# Patient Record
Sex: Male | Born: 1960 | Race: White | Hispanic: No | Marital: Married | Smoking: Never smoker
Health system: Southern US, Community
[De-identification: ages and names within clinical notes are randomized; demographics above are authoritative.]

## PROBLEM LIST (undated history)

## (undated) DIAGNOSIS — G4733 Obstructive sleep apnea (adult) (pediatric): Secondary | ICD-10-CM

## (undated) DIAGNOSIS — E669 Obesity, unspecified: Secondary | ICD-10-CM

## (undated) DIAGNOSIS — I77810 Thoracic aortic ectasia: Secondary | ICD-10-CM

## (undated) DIAGNOSIS — K649 Unspecified hemorrhoids: Secondary | ICD-10-CM

## (undated) DIAGNOSIS — I10 Essential (primary) hypertension: Secondary | ICD-10-CM

## (undated) DIAGNOSIS — J45909 Unspecified asthma, uncomplicated: Secondary | ICD-10-CM

## (undated) DIAGNOSIS — E785 Hyperlipidemia, unspecified: Secondary | ICD-10-CM

## (undated) DIAGNOSIS — N200 Calculus of kidney: Secondary | ICD-10-CM

## (undated) DIAGNOSIS — T7840XA Allergy, unspecified, initial encounter: Secondary | ICD-10-CM

## (undated) DIAGNOSIS — R599 Enlarged lymph nodes, unspecified: Secondary | ICD-10-CM

## (undated) DIAGNOSIS — K5792 Diverticulitis of intestine, part unspecified, without perforation or abscess without bleeding: Secondary | ICD-10-CM

## (undated) DIAGNOSIS — J302 Other seasonal allergic rhinitis: Secondary | ICD-10-CM

## (undated) HISTORY — PX: VASECTOMY: SHX75

## (undated) HISTORY — DX: Enlarged lymph nodes, unspecified: R59.9

## (undated) HISTORY — DX: Diverticulitis of intestine, part unspecified, without perforation or abscess without bleeding: K57.92

## (undated) HISTORY — DX: Thoracic aortic ectasia: I77.810

## (undated) HISTORY — DX: Obstructive sleep apnea (adult) (pediatric): G47.33

## (undated) HISTORY — DX: Calculus of kidney: N20.0

## (undated) HISTORY — DX: Essential (primary) hypertension: I10

## (undated) HISTORY — DX: Other seasonal allergic rhinitis: J30.2

## (undated) HISTORY — DX: Allergy, unspecified, initial encounter: T78.40XA

## (undated) HISTORY — DX: Unspecified asthma, uncomplicated: J45.909

## (undated) HISTORY — DX: Unspecified hemorrhoids: K64.9

## (undated) HISTORY — DX: Hyperlipidemia, unspecified: E78.5

## (undated) HISTORY — DX: Obesity, unspecified: E66.9

---

## 2002-03-10 ENCOUNTER — Encounter: Payer: Self-pay | Admitting: *Deleted

## 2002-03-10 ENCOUNTER — Ambulatory Visit (HOSPITAL_COMMUNITY): Admission: RE | Admit: 2002-03-10 | Discharge: 2002-03-10 | Payer: Self-pay | Admitting: *Deleted

## 2007-06-11 ENCOUNTER — Emergency Department (HOSPITAL_COMMUNITY): Admission: EM | Admit: 2007-06-11 | Discharge: 2007-06-11 | Payer: Self-pay | Admitting: Emergency Medicine

## 2010-01-16 ENCOUNTER — Ambulatory Visit (HOSPITAL_COMMUNITY): Admission: RE | Admit: 2010-01-16 | Discharge: 2010-01-16 | Payer: Self-pay | Admitting: Internal Medicine

## 2010-04-01 ENCOUNTER — Ambulatory Visit (HOSPITAL_COMMUNITY): Admission: RE | Admit: 2010-04-01 | Discharge: 2010-04-01 | Payer: Self-pay | Admitting: Internal Medicine

## 2010-06-10 ENCOUNTER — Ambulatory Visit (HOSPITAL_COMMUNITY): Admission: RE | Admit: 2010-06-10 | Discharge: 2010-06-10 | Payer: Self-pay | Admitting: Internal Medicine

## 2010-06-13 ENCOUNTER — Ambulatory Visit: Payer: Self-pay | Admitting: Oncology

## 2010-06-14 LAB — CBC WITH DIFFERENTIAL/PLATELET
BASO%: 0.3 % (ref 0.0–2.0)
Basophils Absolute: 0 10*3/uL (ref 0.0–0.1)
EOS%: 3.8 % (ref 0.0–7.0)
Eosinophils Absolute: 0.3 10*3/uL (ref 0.0–0.5)
HCT: 46.3 % (ref 38.4–49.9)
HGB: 16.3 g/dL (ref 13.0–17.1)
LYMPH%: 17.6 % (ref 14.0–49.0)
MCH: 30.6 pg (ref 27.2–33.4)
MCHC: 35.2 g/dL (ref 32.0–36.0)
MCV: 87 fL (ref 79.3–98.0)
MONO#: 0.6 10*3/uL (ref 0.1–0.9)
MONO%: 9.1 % (ref 0.0–14.0)
NEUT#: 4.6 10*3/uL (ref 1.5–6.5)
NEUT%: 69.2 % (ref 39.0–75.0)
Platelets: 254 10*3/uL (ref 140–400)
RBC: 5.32 10*6/uL (ref 4.20–5.82)
RDW: 13.6 % (ref 11.0–14.6)
WBC: 6.6 10*3/uL (ref 4.0–10.3)
lymph#: 1.2 10*3/uL (ref 0.9–3.3)

## 2010-06-14 LAB — COMPREHENSIVE METABOLIC PANEL
ALT: 28 U/L (ref 0–53)
AST: 25 U/L (ref 0–37)
Albumin: 4.4 g/dL (ref 3.5–5.2)
Alkaline Phosphatase: 64 U/L (ref 39–117)
BUN: 12 mg/dL (ref 6–23)
CO2: 28 mEq/L (ref 19–32)
Calcium: 9.3 mg/dL (ref 8.4–10.5)
Chloride: 106 mEq/L (ref 96–112)
Creatinine, Ser: 1.02 mg/dL (ref 0.40–1.50)
Glucose, Bld: 112 mg/dL — ABNORMAL HIGH (ref 70–99)
Potassium: 3.6 mEq/L (ref 3.5–5.3)
Sodium: 141 mEq/L (ref 135–145)
Total Bilirubin: 1 mg/dL (ref 0.3–1.2)
Total Protein: 7.5 g/dL (ref 6.0–8.3)

## 2010-06-14 LAB — MORPHOLOGY
PLT EST: ADEQUATE
RBC Comments: NORMAL

## 2010-06-14 LAB — CHCC SMEAR

## 2010-06-14 LAB — LACTATE DEHYDROGENASE: LDH: 152 U/L (ref 94–250)

## 2010-06-18 LAB — BETA 2 MICROGLOBULIN, SERUM: Beta-2 Microglobulin: 1.54 mg/L (ref 1.01–1.73)

## 2010-06-18 LAB — HEPATITIS B SURFACE ANTIGEN: Hepatitis B Surface Ag: NEGATIVE

## 2010-06-18 LAB — HEPATITIS B SURFACE ANTIBODY,QUALITATIVE: Hep B S Ab: POSITIVE — AB

## 2010-06-18 LAB — HEPATITIS C ANTIBODY: HCV Ab: NEGATIVE

## 2010-10-16 ENCOUNTER — Ambulatory Visit: Payer: Self-pay | Admitting: Pulmonary Disease

## 2010-10-16 DIAGNOSIS — G4733 Obstructive sleep apnea (adult) (pediatric): Secondary | ICD-10-CM

## 2010-10-16 HISTORY — DX: Obstructive sleep apnea (adult) (pediatric): G47.33

## 2010-12-12 NOTE — Assessment & Plan Note (Signed)
Summary: consult for possible osa   Visit Type:  Initial Consult Copy to:  David Brooklyn MD Primary David Fuller/Referring David Fuller:  David Brooklyn MD  CC:  sleep consult. PT states his wife says he snores, gasps for air at night, and feels tired during the day. David Fuller Kitchen  History of Present Illness: The pt is a 50y/o male who I have been asked to see for possible osa.  He has been noted to have loud snoring during the night, as well as an abnormal breathing pattern during sleep.  He typically goes to bed at 930pm and arises at 6am to start his day.  He does not feel rested upon arising, but blames on "taking call at night".  He feels "tired" all of the time.  He denies daytime sleepiness, but feels he stays very active.  He will doze very easily watching movies at home, especially if it is not an action film.  He denies any issues with sleepiness while driving.  His epworth score today is normal at 0, and the pt tells me his weight is up about 15 pounds over the last 2 yrs.    Preventive Screening-Counseling & Management  Alcohol-Tobacco     Smoking Status: never  Current Medications (verified): 1)  None  Allergies (verified): 1)  ! Iodine  Past History:  Past Medical History: Allergy problem  Past Surgical History: none  Family History: Reviewed history and no changes required. 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  married  Smoking Status:  never  Review of Systems       The patient complains of productive cough, sore throat, and tooth/dental problems.  The patient denies shortness of breath with activity, shortness of breath at rest, non-productive cough, coughing up blood, chest pain, irregular heartbeats, acid heartburn, indigestion, loss of appetite, weight change, abdominal pain, difficulty swallowing, headaches, nasal congestion/difficulty breathing through nose,  sneezing, itching, ear ache, anxiety, depression, hand/feet swelling, joint stiffness or pain, rash, change in color of mucus, and fever.    Vital Signs:  Patient profile:   50 year old male Height:      71 inches Weight:      257.38 pounds BMI:     36.03 O2 Sat:      96 % on Room air Temp:     98.2 degrees F oral Pulse rate:   96 / minute BP sitting:   136 / 74  (left arm) Cuff size:   large  Vitals Entered By: David Fuller (October 16, 2010 2:07 PM)  O2 Flow:  Room air CC: sleep consult. PT states his wife says he snores, gasps for air at night, feels tired during the day.  Comments meds and allergies updated Phone number updated David Fuller  October 16, 2010 2:07 PM    Physical Exam  General:  ow male in nad Eyes:  PERRLA and EOMI.   Nose:  mild deviation to right with narrowing. Mouth:  elongation of soft palate and uvula no exudates Neck:  no jvd, tmg, LN Lungs:  clear to auscultation no wheezing or rhonchi Heart:  rrr, no mrg Abdomen:  soft and nontender, bs+ Extremities:  no edema noted, pulses intact distally no cyanosis  Neurologic:  alert and oriented, moves all 4. does not appear sleepy.   Impression & Recommendations:  Problem # 1:  OBSTRUCTIVE SLEEP APNEA (ICD-327.23) the pt does have some history that is suggestive of osa,  but it is hard to know if this is just symptomatic snoring.  I have had a long discussion with the pt about sleep apnea, including its impact on QOL and CV health.  I think there is enough suspicion to warrant a sleep study, but given his age and lack of co-morbid medical issues, I think the evaluation can be done with home sleep testing.  The pt is agreeable to this approach.  I have also encouraged the pt to work on weight loss, and reviewed the connection with sleep disordered breathing.    Other Orders: Consultation Level IV (09811) Misc. Referral (Misc. Ref)  Patient Instructions: 1)  will schedule for a home sleep test to  evaluate for possible sleep apnea 2)  work on weight loss. 3)  will call with results of sleep study   Immunization History:  Influenza Immunization History:    Influenza:  historical (08/10/2010)

## 2010-12-18 ENCOUNTER — Ambulatory Visit (INDEPENDENT_AMBULATORY_CARE_PROVIDER_SITE_OTHER): Payer: 59 | Admitting: Pulmonary Disease

## 2010-12-18 ENCOUNTER — Encounter: Payer: Self-pay | Admitting: Pulmonary Disease

## 2010-12-18 DIAGNOSIS — G4733 Obstructive sleep apnea (adult) (pediatric): Secondary | ICD-10-CM

## 2010-12-25 ENCOUNTER — Telehealth (INDEPENDENT_AMBULATORY_CARE_PROVIDER_SITE_OTHER): Payer: Self-pay | Admitting: *Deleted

## 2010-12-26 ENCOUNTER — Telehealth (INDEPENDENT_AMBULATORY_CARE_PROVIDER_SITE_OTHER): Payer: Self-pay | Admitting: *Deleted

## 2011-01-01 NOTE — Progress Notes (Signed)
Summary: returning a call to [facility]  Phone Note Call from Patient   Caller: Patient Call For: [facility] Summary of Call: Patient phoned stated he was returning a call from [place]. He can be reached at [phone] Initial call taken by: Vedia Coffer,  December 25, 2010 4:36 PM  Follow-up for Phone Call        called pt back.  see append to consult note dated 12/18/2010 for complete details. Aundra Millet Reynolds LPN  December 25, 2010 4:44 PM

## 2011-01-01 NOTE — Assessment & Plan Note (Signed)
Summary: home sleep study shows AHI 25/hr.   Copy to:  Marisue Brooklyn MD Primary Provider/Referring Provider:  Marisue Brooklyn MD   History of Present Illness: the pt underwent home sleep testing with a type 3 portable device.  Airflow, respiratory effort, heartrate, and oximetry were all monitored.  Tracings and data summary have been reviewed with the following findings:  1) flow and saturation evaluation periods were 6hrs and one minute 2)  the pt was found to have 15 obstructive apneas and 138 obstructive hypopneas for an AHI of 25/hr. 3)  low oxygen saturation was 79%, with during the night spent less than or equal to 88%.   Allergies: 1)  ! Iodine   Impression & Recommendations:  Problem # 1:  OBSTRUCTIVE SLEEP APNEA (ICD-327.23)  the pt's home sleep testing reveals moderate osa with AHI 25/hr and desaturation to 79%.  Treatment options include a trial of weight loss, upper airway surgery, dental appliance, and cpap.  Clinical correlation suggested.  Other Orders: Sleep Std Airflow/Heartrate and O2 SAT unattended (16109)  Appended Document: home sleep study shows AHI 25/hr. Aundra Millet, pt needs ov to review sleep study  Appended Document: home sleep study shows AHI 25/hr. [id number]  Appended Document: home sleep study shows AHI 25/hr. [id number]  Appended Document: home sleep study shows AHI 25/hr. pt called back.  offered to schedule pt for an ov with kc to discuss sleep study results.  pt stated " i'm not interested right now in the results and do not want to make an appt at this time.  If i change my mind I'll call back to schedule ov. "  Will forward message to [facility] as an FYI.    Appended Document: home sleep study shows AHI 25/hr. Aundra Millet, please call Dr. Hardie Pulley nurse and let her know pt has moderate sleep apnea by his sleep study, but the pt did not wish to come back to discuss treatment.  "just wanted to let them know".  thanks.   Appended Document: home sleep  study shows AHI 25/hr. spoke with dr. Rock Nephew nurse and advised.

## 2011-01-07 NOTE — Progress Notes (Signed)
Summary: returning call  > 3.12.12 appt w/ [facility]  Phone Note Call from Patient Call back at [phone]   Caller: Patient Call For: clance Summary of Call: Returning Megan's call. Initial call taken by: Darletta Moll,  December 26, 2010 3:39 PM  Follow-up for Phone Call        I did not call this pt.  See previous phone note.[name] KitchenMarland KitchenMarland KitchenI had spoke with him about getting him in for an appt to discuss sleep study results and pt did not wish to come in at this time to discuss results.  So I'm not sure why he is calling back.  [id number].  [name] KitchenArman Filter LPN  January 01, 2011 9:24 AM   Scheduled for 3/12 with KC.Juanita [facility]  January 01, 2011 11:03 AM  b/c of pending appts, will sign off on message. Boone Master CNA/MA  January 01, 2011 11:14 AM

## 2011-01-20 ENCOUNTER — Encounter: Payer: Self-pay | Admitting: Pulmonary Disease

## 2011-01-20 ENCOUNTER — Ambulatory Visit (INDEPENDENT_AMBULATORY_CARE_PROVIDER_SITE_OTHER): Payer: 59 | Admitting: Pulmonary Disease

## 2011-01-20 DIAGNOSIS — G4733 Obstructive sleep apnea (adult) (pediatric): Secondary | ICD-10-CM

## 2011-01-28 NOTE — Assessment & Plan Note (Signed)
Summary: rov to discuss sleep study results.    Copy to:  Marisue Brooklyn MD Primary Provider/Referring Provider:  Marisue Brooklyn MD  CC:  Ov to discuss apnea link results.  Pt states he recently started on Zyrtec d/t PND. [name] Kitchen  History of Present Illness: the pt comes in today for f/u of his recent sleep study.  He was found to have moderate osa, with an AHI of 25/hr and desat transiently to 79%.  I have reviewed the study with him in detail, and answered all of his questions.    Current Medications (verified): 1)  Zyrtec Allergy 10 Mg Tabs (Cetirizine Hcl) .... Take 1 Tablet By Mouth Once A Day  Allergies (verified): 1)  ! Iodine  Review of Systems       The patient complains of productive cough, non-productive cough, weight change, and joint stiffness or pain.  The patient denies shortness of breath with activity, shortness of breath at rest, coughing up blood, chest pain, irregular heartbeats, acid heartburn, indigestion, loss of appetite, abdominal pain, difficulty swallowing, sore throat, tooth/dental problems, headaches, nasal congestion/difficulty breathing through nose, sneezing, itching, ear ache, anxiety, depression, hand/feet swelling, rash, change in color of mucus, and fever.    Vital Signs:  Patient profile:   50 year old male Height:      71 inches Weight:      267 pounds BMI:     37.37 O2 Sat:      98 % on Room air Temp:     98.0 degrees F oral Pulse rate:   92 / minute BP sitting:   114 / 72  Vitals Entered By: Arman Filter LPN (January 20, 2011 1:29 PM)  O2 Flow:  Room air CC: Ov to discuss apnea link results.  Pt states he recently started on Zyrtec d/t PND.  Comments Medications reviewed with patient Arman Filter LPN  January 20, 2011 1:29 PM    Physical Exam  General:  obese male in nad  Extremities:  no edema or cyanosis  Neurologic:  alert, does not appear sleepy, moves all 4    Impression & Recommendations:  Problem # 1:  OBSTRUCTIVE SLEEP APNEA  (ICD-327.23)  the pt has moderate osa by his recent sleep study.  I have discussed the various treatment options with him, including a trial of weight loss alone, upper airway surgery, dental appliance, and cpap.  At least this degree of sleep apnea does not represent a large CV risk for him.  At this point, he would like to take the next 6mos and work on weight loss.  If he is not successful, he will consider one of the more aggressive treatment options.  Medications Added to Medication List This Visit: 1)  Zyrtec Allergy 10 Mg Tabs (Cetirizine hcl) .... Take 1 tablet by mouth once a day  Other Orders: Est. Patient Level III (16109)  Patient Instructions: 1)  work on weight loss 2)  please call if not successful over the next 6mos, or if you decide to treat your sleep apnea more aggressively.

## 2011-04-15 ENCOUNTER — Encounter (INDEPENDENT_AMBULATORY_CARE_PROVIDER_SITE_OTHER): Payer: Self-pay | Admitting: General Surgery

## 2011-05-27 ENCOUNTER — Encounter (INDEPENDENT_AMBULATORY_CARE_PROVIDER_SITE_OTHER): Payer: Self-pay | Admitting: General Surgery

## 2011-05-27 ENCOUNTER — Ambulatory Visit (INDEPENDENT_AMBULATORY_CARE_PROVIDER_SITE_OTHER): Payer: 59 | Admitting: General Surgery

## 2011-05-27 VITALS — BP 144/106 | HR 80 | Temp 97.0°F

## 2011-05-27 DIAGNOSIS — R591 Generalized enlarged lymph nodes: Secondary | ICD-10-CM

## 2011-05-27 DIAGNOSIS — R599 Enlarged lymph nodes, unspecified: Secondary | ICD-10-CM

## 2011-05-27 NOTE — Progress Notes (Signed)
HPI The patient comes in today for recheck for lymphadenopathy possibly seen on the CT scan before. On physical exam today he has no evidence of recurrent problems  PE In his cervical region he has no evidence of cervical adenopathy  In his suprasternal scalene area he has no adenopathy in his axilla he has no adenopathy he has no epicondylar adenopathy.  He has no inguinal adenopathy  Studiy review There are no current studies to review however I plan on ordering a CT scan of his abdomen and pelvis with contrast.  Assessment No clinical evidence of enlarged lymph nodes throughout his body  Plan In repeat CT scan of his abdomen and pelvis prior to discharge the patient from clinic.

## 2011-06-02 ENCOUNTER — Other Ambulatory Visit (HOSPITAL_COMMUNITY): Payer: 59

## 2011-06-06 ENCOUNTER — Ambulatory Visit (HOSPITAL_COMMUNITY)
Admission: RE | Admit: 2011-06-06 | Discharge: 2011-06-06 | Disposition: A | Discharge status: Home / Self Care | Payer: 59 | Source: Ambulatory Visit | Attending: General Surgery | Admitting: General Surgery

## 2011-06-06 ENCOUNTER — Other Ambulatory Visit (INDEPENDENT_AMBULATORY_CARE_PROVIDER_SITE_OTHER): Payer: Self-pay | Admitting: General Surgery

## 2011-06-06 ENCOUNTER — Other Ambulatory Visit (HOSPITAL_COMMUNITY): Payer: Self-pay | Admitting: General Surgery

## 2011-06-06 DIAGNOSIS — R05 Cough: Secondary | ICD-10-CM

## 2011-06-06 DIAGNOSIS — N200 Calculus of kidney: Secondary | ICD-10-CM | POA: Insufficient documentation

## 2011-06-06 DIAGNOSIS — Z91013 Allergy to seafood: Secondary | ICD-10-CM | POA: Insufficient documentation

## 2011-06-06 DIAGNOSIS — R591 Generalized enlarged lymph nodes: Secondary | ICD-10-CM

## 2011-06-06 DIAGNOSIS — K5730 Diverticulosis of large intestine without perforation or abscess without bleeding: Secondary | ICD-10-CM | POA: Insufficient documentation

## 2011-06-06 DIAGNOSIS — R053 Chronic cough: Secondary | ICD-10-CM

## 2011-06-06 DIAGNOSIS — R599 Enlarged lymph nodes, unspecified: Secondary | ICD-10-CM | POA: Insufficient documentation

## 2011-06-12 ENCOUNTER — Telehealth (INDEPENDENT_AMBULATORY_CARE_PROVIDER_SITE_OTHER): Payer: Self-pay | Admitting: General Surgery

## 2011-06-12 NOTE — Telephone Encounter (Signed)
pls call patient regarding CT results.[name] Kitchenjw8/2

## 2011-06-12 NOTE — Telephone Encounter (Signed)
pls call patient reguarding CT test results...jw8/2

## 2011-06-12 NOTE — Telephone Encounter (Signed)
David Fuller, David Fuller will be working tomorrow and would like for you to call his cell phone @ [phone] with his CT results.

## 2011-06-12 NOTE — Telephone Encounter (Signed)
pls call pt reguarding test results

## 2011-06-25 ENCOUNTER — Other Ambulatory Visit (INDEPENDENT_AMBULATORY_CARE_PROVIDER_SITE_OTHER): Payer: Self-pay

## 2011-06-25 ENCOUNTER — Other Ambulatory Visit (INDEPENDENT_AMBULATORY_CARE_PROVIDER_SITE_OTHER): Payer: Self-pay | Admitting: General Surgery

## 2011-06-25 DIAGNOSIS — C8590 Non-Hodgkin lymphoma, unspecified, unspecified site: Secondary | ICD-10-CM

## 2011-06-25 DIAGNOSIS — R591 Generalized enlarged lymph nodes: Secondary | ICD-10-CM

## 2011-07-02 ENCOUNTER — Encounter (INDEPENDENT_AMBULATORY_CARE_PROVIDER_SITE_OTHER): Payer: Self-pay | Admitting: General Surgery

## 2011-07-03 ENCOUNTER — Other Ambulatory Visit (HOSPITAL_COMMUNITY): Payer: 59

## 2011-07-03 ENCOUNTER — Inpatient Hospital Stay (HOSPITAL_COMMUNITY): Admission: RE | Admit: 2011-07-03 | Payer: 59 | Source: Ambulatory Visit

## 2011-07-04 ENCOUNTER — Encounter (HOSPITAL_COMMUNITY)
Admission: RE | Admit: 2011-07-04 | Discharge: 2011-07-04 | Disposition: A | Discharge status: Home / Self Care | Payer: 59 | Source: Ambulatory Visit | Attending: General Surgery | Admitting: General Surgery

## 2011-07-04 DIAGNOSIS — R591 Generalized enlarged lymph nodes: Secondary | ICD-10-CM

## 2011-07-04 DIAGNOSIS — R599 Enlarged lymph nodes, unspecified: Secondary | ICD-10-CM | POA: Insufficient documentation

## 2011-07-04 DIAGNOSIS — N200 Calculus of kidney: Secondary | ICD-10-CM | POA: Insufficient documentation

## 2011-07-04 MED ORDER — FLUDEOXYGLUCOSE F - 18 (FDG) INJECTION
16.3000 | Freq: Once | INTRAVENOUS | Status: AC | PRN
  Administered 2011-07-04: 16.3 via INTRAVENOUS

## 2011-07-07 LAB — GLUCOSE, CAPILLARY: Glucose-Capillary: 100 mg/dL — ABNORMAL HIGH (ref 70–99)

## 2011-07-10 ENCOUNTER — Telehealth (INDEPENDENT_AMBULATORY_CARE_PROVIDER_SITE_OTHER): Payer: Self-pay | Admitting: General Surgery

## 2011-07-10 NOTE — Telephone Encounter (Signed)
Talked to patient to explain PET scan and CT results.  At this point there is no urgent need to perform a biopsy.  There are some areas of increased activity, and they recommended either repeat study in some period of time (I have chosen 6 months) or biopsy.  Biopsy could be laparoscopic or open, but one of the LNs was in the obdurator area on the right side.  The official recommendation is to wait 6 months at which time we will need a repeat PET scan or the CT scan this is per the recommendation of the radiologist. If there are any clinical changes in that period of time then we will go ahead with an open or laparoscopic biopsy

## 2012-06-01 ENCOUNTER — Ambulatory Visit (INDEPENDENT_AMBULATORY_CARE_PROVIDER_SITE_OTHER): Payer: 59 | Admitting: General Surgery

## 2012-06-01 ENCOUNTER — Encounter (INDEPENDENT_AMBULATORY_CARE_PROVIDER_SITE_OTHER): Payer: Self-pay | Admitting: General Surgery

## 2012-06-01 VITALS — BP 132/96 | HR 76 | Temp 97.8°F | Resp 16 | Ht 73.0 in | Wt 266.4 lb

## 2012-06-01 DIAGNOSIS — R599 Enlarged lymph nodes, unspecified: Secondary | ICD-10-CM

## 2012-06-01 DIAGNOSIS — R590 Localized enlarged lymph nodes: Secondary | ICD-10-CM

## 2012-06-01 NOTE — Progress Notes (Signed)
HPI The patient was verified any constitutional symptoms. He has no fevers, chills, or weight loss. He has no unusual pain in this joints.  PE On examination I examined the cervical lymph node chains were there was no evidence of adenopathy. He had no supraclavicular evidence of adenopathy. There was no axillary evidence of adenopathy. There is no inguinal evidence of adenopathy. There was no epicochlear clear evidence of adenopathy  Studiy review There are no new studies to review.  Assessment No evidence of pathologic lymphadenopathy.  Plan The patient is to return to see me if he should develop any lymphadenopathy, constitutional symptoms including weight loss, arthralgias, as night sweats, and unusual body pain.

## 2012-06-04 ENCOUNTER — Ambulatory Visit (INDEPENDENT_AMBULATORY_CARE_PROVIDER_SITE_OTHER): Payer: 59 | Admitting: Physician Assistant

## 2012-06-04 VITALS — BP 138/82 | HR 92 | Temp 97.8°F | Resp 18 | Ht 74.5 in | Wt 266.0 lb

## 2012-06-04 DIAGNOSIS — L309 Dermatitis, unspecified: Secondary | ICD-10-CM

## 2012-06-04 DIAGNOSIS — T63481A Toxic effect of venom of other arthropod, accidental (unintentional), initial encounter: Secondary | ICD-10-CM

## 2012-06-04 DIAGNOSIS — L259 Unspecified contact dermatitis, unspecified cause: Secondary | ICD-10-CM

## 2012-06-04 DIAGNOSIS — T6391XA Toxic effect of contact with unspecified venomous animal, accidental (unintentional), initial encounter: Secondary | ICD-10-CM

## 2012-06-04 NOTE — Patient Instructions (Signed)
Use an OTC antihistamine to alleviate any additional swelling or itching.

## 2012-06-04 NOTE — Progress Notes (Signed)
  Subjective:    Patient ID: David Fuller, male    DOB: 22-May-1961, 51 y.o.   MRN: [id number]  HPI This 51 y.o. Male presents for evaluation of a bite on the right flank this afternoon. Mowing mother's yard when felt "Something like a 25 gauge needle sticking me in the side."  Swatted at it, but didn't find anything in his clothing.  Continued his activity and then several hours later noticed the area was irritated.  It was red and swollen.  His wife urged him to come in.  He notes it is improved somewhat now.  Review of Systems As above.  Past Medical History  Diagnosis Date  . Diverticulitis   . Hemorrhoid   . Kidney stones   . Swollen lymph nodes     abd, groin,   . Allergy     Past Surgical History  Procedure Date  . Vasectomy MID 90S    Prior to Admission medications   Medication Sig Start Date End Date Taking? Authorizing Provider  fluticasone (FLONASE) 50 MCG/ACT nasal spray as needed.  03/18/11  Yes Historical Provider, MD  SINGULAIR 10 MG tablet  04/16/11  Yes Historical Provider, MD  VENTOLIN HFA 108 (90 BASE) MCG/ACT inhaler as needed.  04/16/11  Yes Historical Provider, MD    Allergies  Allergen Reactions  . Shellfish Allergy Nausea Only  . Iodine     History   Social History  . Marital Status: Married    Spouse Name: N/A    Number of Children: 1 daughter, age 29, Paramedic in [place], [place]  . Years of Education: N/A   Occupational History  . 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 Smoking status: Never Smoker   . Smokeless tobacco: Never Used  . Alcohol Use: 0.0 oz/week    1-2 Cans of beer per week     2OZ A WEEK  . Drug Use: No  . Sexually Active: Not on file   Other Topics Concern  . Not on file   Social History Narrative  . No narrative on file    Family History  Problem Relation Age of Onset  . Diabetes Father   . Cancer Brother     Melanoma- left arm       Objective:   Physical  Exam  Blood pressure 138/82, pulse 92, temperature 97.8 F (36.6 C), temperature source Oral, resp. rate 18, height 6' 2.5" (1.892 m), weight 266 lb (120.657 kg), SpO2 98.00%. Body mass index is 33.70 kg/(m^2). Well-developed, well nourished WM who is awake, alert and oriented, in NAD. HEENT: [place]/AT, sclera and conjunctiva are clear.   Lungs: normal effort Skin: warm and dry with area of mild erythema of the right flank.  No induration, drainage, urticaria.  o wound.       Assessment & Plan:   1. Dermatitis   2. Insect sting    Patient Instructions  Use an OTC antihistamine to alleviate any additional swelling or itching.   RTC prn.

## 2013-05-04 IMAGING — CT CT ABD-PELV W/O CM
1 of 2 series · 15 of 32 positions shown, 19 images · non-contrast
Comparison: [DATE] and 04/01/2010

CLINICAL DATA: Reevaluate retroperitoneal lymphadenopathy. No IV
contrast was used as the patient is severely allergic to
shellfish.

CT ABDOMEN AND PELVIS WITHOUT CONTRAST
TECHNIQUE: Multidetector CT imaging of the abdomen and pelvis was
performed following the standard protocol without intravenous
contrast.

[Series 2: routine abdomen/pelvis with · axial · 0.94mm/px · z∈[-564,-74]mm · 15 of 108 slices shown, 19 images]
[im 5/108  soft-tissue]
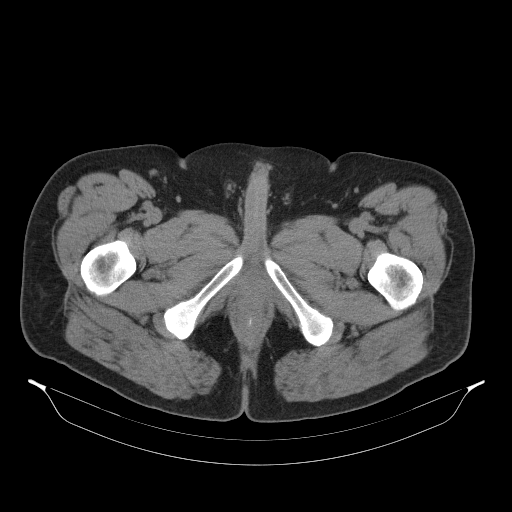
[im 5/108  bone]
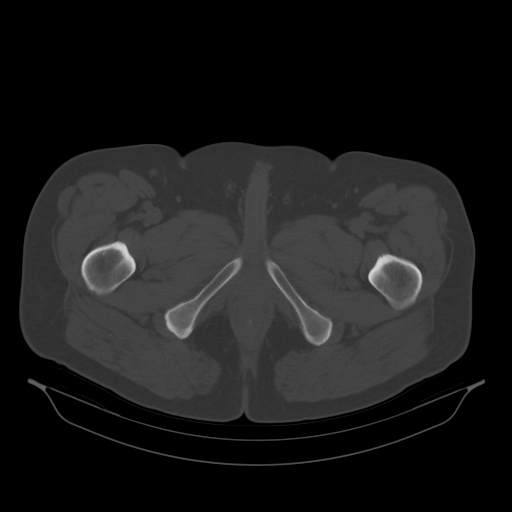
[im 14/108  soft-tissue]
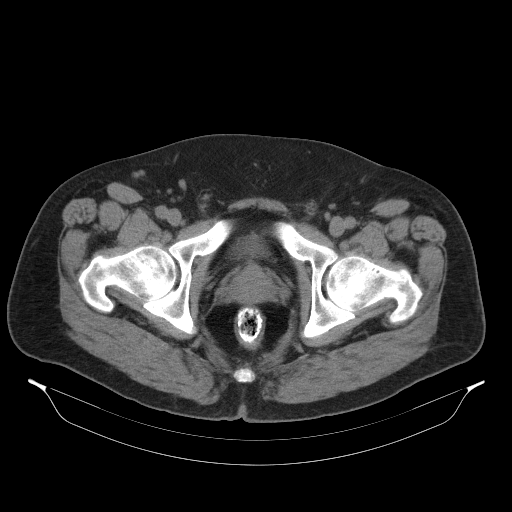
[im 23/108  soft-tissue]
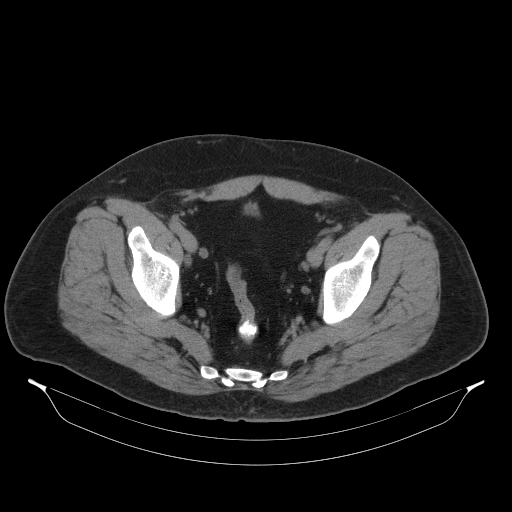
[im 32/108  soft-tissue]
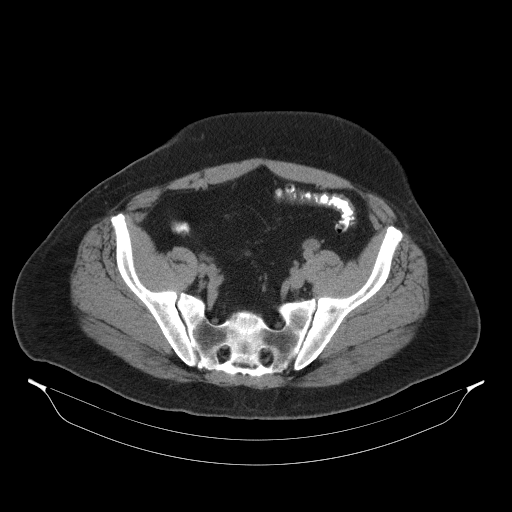
[im 36/108  soft-tissue]
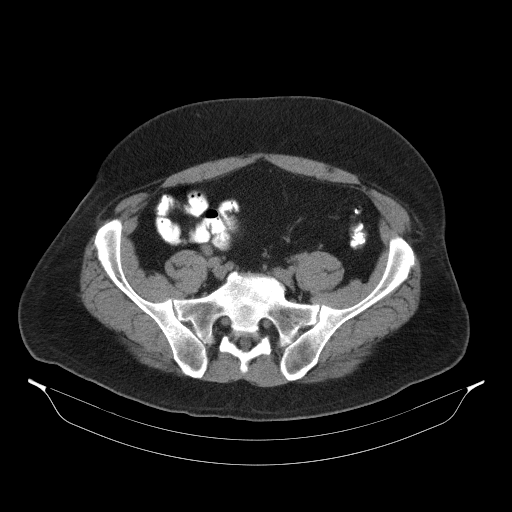
[im 45/108  soft-tissue]
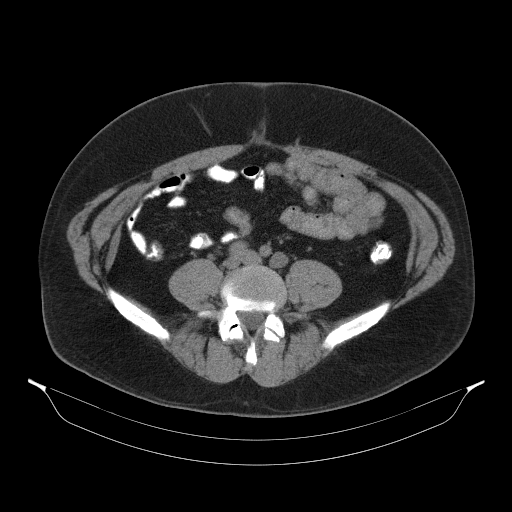
[im 54/108  soft-tissue]
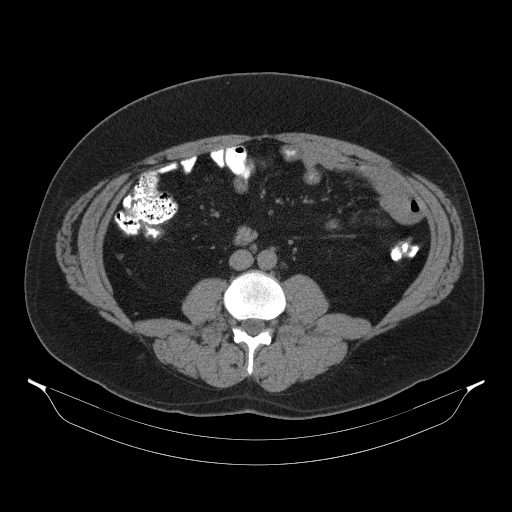
[im 63/108  soft-tissue]
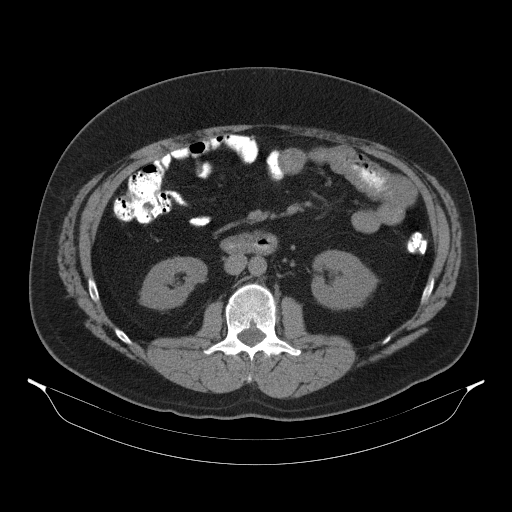
[im 72/108  soft-tissue]
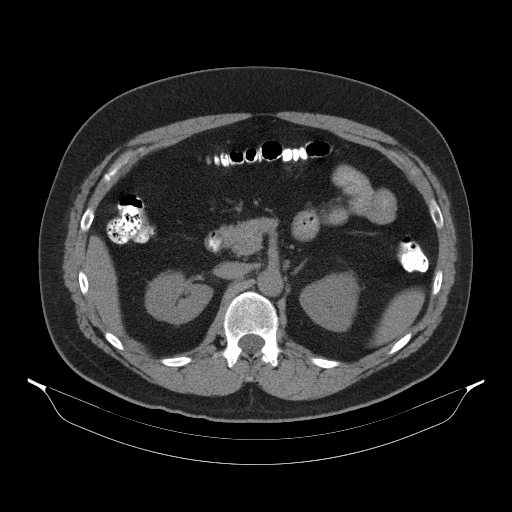
[im 72/108  bone]
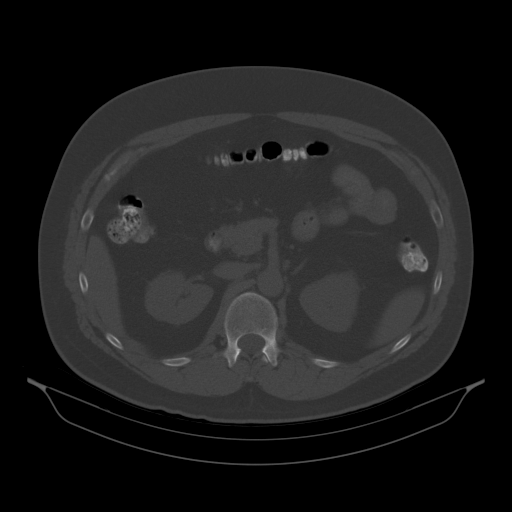
[im 76/108  soft-tissue]
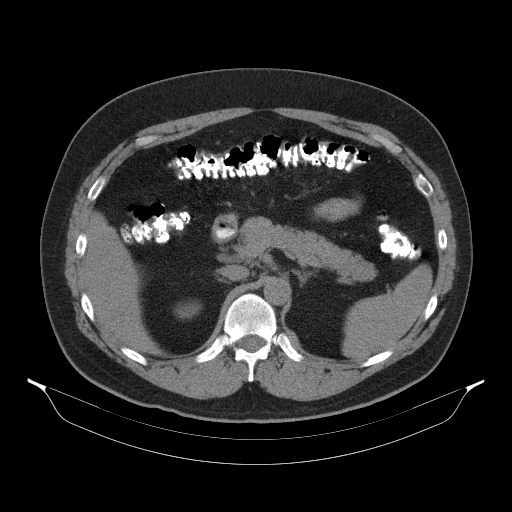
[im 85/108  soft-tissue]
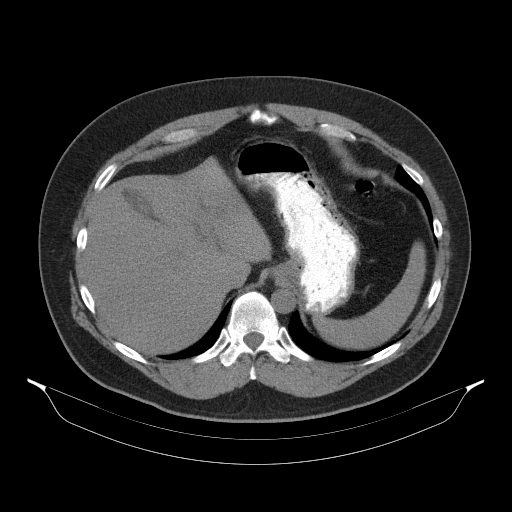
[im 90/108  lung]
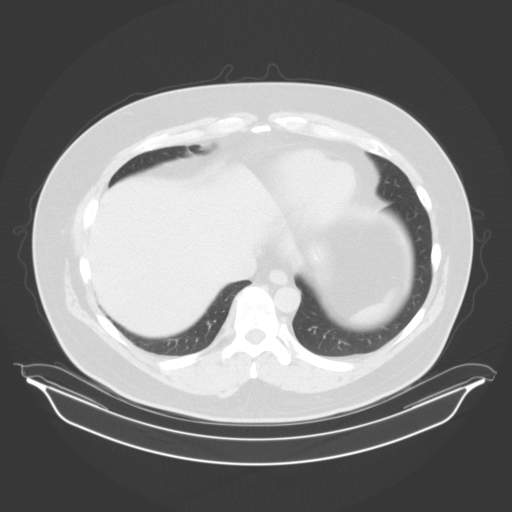
[im 94/108  soft-tissue]
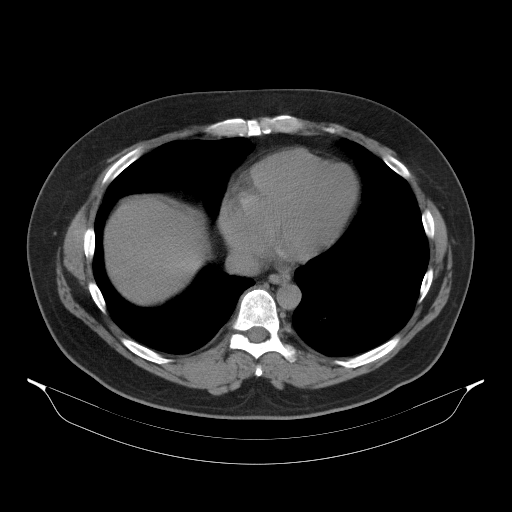
[im 94/108  lung]
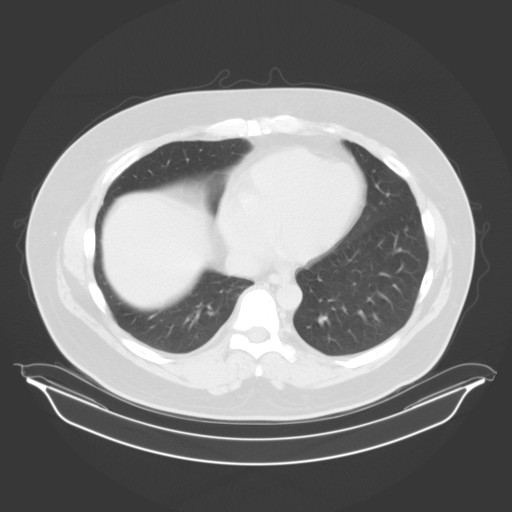
[im 99/108  lung]
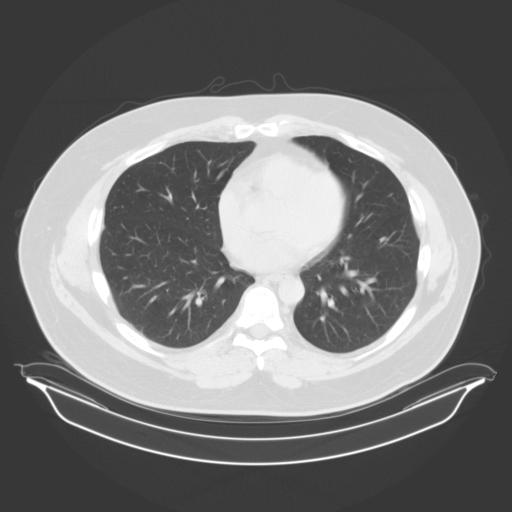
[im 103/108  soft-tissue]
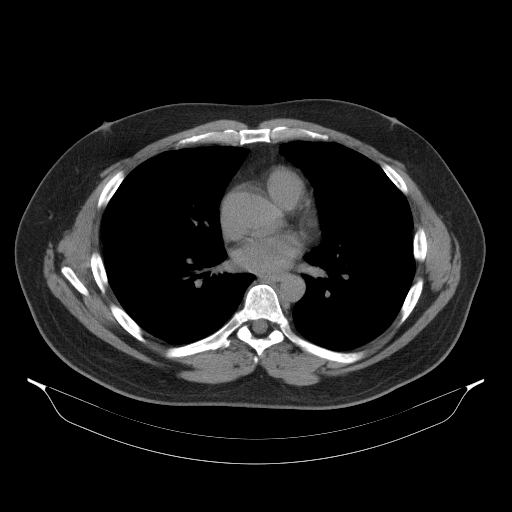
[im 103/108  lung]
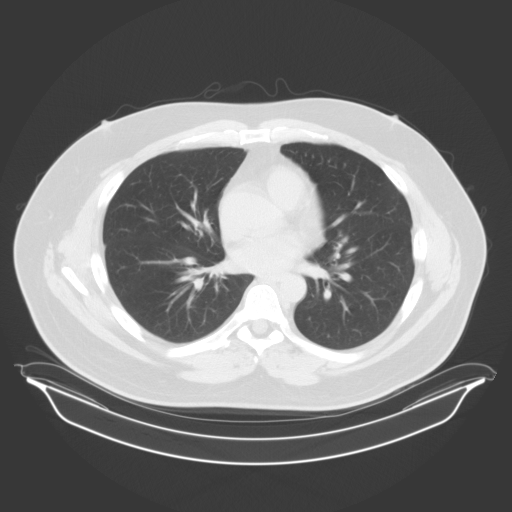

[15 of 32 positions shown; findings below may reference images not displayed]

FINDINGS: The lung bases remain clear.

Again noted in the the presence of pelvic and retroperitoneal
adenopathy the largest lymph node in the left external iliac chain
is slightly larger today measuring 3.9 x 1.5 cm (image 90) (3.7 x
1.4 cm previously).  Multiple other lymph nodes along both external
and common iliac chains are seen.  These all appear stable in size.
No new adenopathy is identified.

The liver, spleen, gallbladder, adrenal glands, pancreas and
kidneys appear stable. Two renal calculi are again identified in
the left kidney one in the mid and one in the lower pole of these
are stable in size and position.

Descending colonic and sigmoid diverticulosis is identified.  No
ancillary findings are identified to suggest concurrent
diverticulitis.  Complete resolution of the minimal pericolonic
stranding seen on the prior exam has taken place.

The bladder and prostate gland appears normal.  No intra-abdominal
or pelvic fluid or inflammatory change are seen.  Extraabdominal
soft tissue planes appear maintained and no worrisome bone lesions
are identified.
IMPRESSION: Slight increase in size of the largest lymph node in the external
iliac chain on the left.  Otherwise stable pelvic and
retroperitoneal adenopathy is seen.  The relative stability is
reassuring and this is likely reactive but the possibility of a
slowly progressive lymphoproliferative disorder persists.

Stable diverticulosis and nonobstructive left renal calculi.

## 2013-08-24 ENCOUNTER — Encounter: Payer: Self-pay | Admitting: Physician Assistant

## 2013-08-24 DIAGNOSIS — J45909 Unspecified asthma, uncomplicated: Secondary | ICD-10-CM | POA: Insufficient documentation

## 2013-08-24 DIAGNOSIS — E785 Hyperlipidemia, unspecified: Secondary | ICD-10-CM | POA: Insufficient documentation

## 2013-08-24 DIAGNOSIS — J302 Other seasonal allergic rhinitis: Secondary | ICD-10-CM | POA: Insufficient documentation

## 2013-08-24 HISTORY — DX: Other seasonal allergic rhinitis: J30.2

## 2013-08-26 ENCOUNTER — Encounter: Payer: Self-pay | Admitting: Internal Medicine

## 2013-09-13 ENCOUNTER — Ambulatory Visit: Payer: 59 | Admitting: Physician Assistant

## 2013-09-13 ENCOUNTER — Encounter: Payer: Self-pay | Admitting: Physician Assistant

## 2013-09-13 ENCOUNTER — Other Ambulatory Visit: Payer: Self-pay | Admitting: Physician Assistant

## 2013-09-13 VITALS — BP 132/78 | HR 88 | Temp 97.9°F | Resp 16 | Ht 73.0 in | Wt 269.0 lb

## 2013-09-13 DIAGNOSIS — E785 Hyperlipidemia, unspecified: Secondary | ICD-10-CM

## 2013-09-13 DIAGNOSIS — J45909 Unspecified asthma, uncomplicated: Secondary | ICD-10-CM

## 2013-09-13 DIAGNOSIS — I10 Essential (primary) hypertension: Secondary | ICD-10-CM

## 2013-09-13 DIAGNOSIS — J302 Other seasonal allergic rhinitis: Secondary | ICD-10-CM

## 2013-09-13 DIAGNOSIS — Z0000 Encounter for general adult medical examination without abnormal findings: Secondary | ICD-10-CM

## 2013-09-13 DIAGNOSIS — G4733 Obstructive sleep apnea (adult) (pediatric): Secondary | ICD-10-CM

## 2013-09-13 LAB — LIPID PANEL
Cholesterol: 217 mg/dL — ABNORMAL HIGH (ref 0–200)
HDL: 46 mg/dL (ref >39)
Total CHOL/HDL Ratio: 4.7 Ratio
Triglycerides: 70 mg/dL (ref <150)

## 2013-09-13 LAB — MAGNESIUM: Magnesium: 1.8 mg/dL (ref 1.5–2.5)

## 2013-09-13 LAB — HEPATIC FUNCTION PANEL
AST: 29 U/L (ref 0–37)
Albumin: 4.5 g/dL (ref 3.5–5.2)
Total Bilirubin: 1.2 mg/dL (ref 0.3–1.2)

## 2013-09-13 LAB — HEMOGLOBIN A1C: Mean Plasma Glucose: 111 mg/dL (ref <117)

## 2013-09-13 MED ORDER — ALBUTEROL SULFATE HFA 108 (90 BASE) MCG/ACT IN AERS: 2.0000 | INHALATION_SPRAY | RESPIRATORY_TRACT | Status: DC | PRN

## 2013-09-13 MED ORDER — MONTELUKAST SODIUM 10 MG PO TABS: 10.0000 mg | ORAL_TABLET | Freq: Every day | ORAL | Status: DC

## 2013-09-13 MED ORDER — FLUTICASONE PROPIONATE 50 MCG/ACT NA SUSP: 2.0000 | NASAL | Status: DC | PRN

## 2013-09-13 NOTE — Progress Notes (Signed)
Patient ID: David Fuller, male   DOB: 10-11-1961, 52 y.o.   MRN: [id number]  Complete Physical HPI Patient presents for complete physical.  Patient's blood pressure has been controlled at home. Patient denies chest pain, shortness of breath, dizziness. Does 20 mins on treadclimber daily without any problems. Patient's cholesterol is diet controlled and denies myalgias. His cholesterol last visit was  155, last visit patient was prescribed lipitor but patient does not want medications . Allergies controlled on meds, needs refills.  .  Current outpatient prescriptions:fexofenadine (ALLEGRA) 180 MG tablet, Take 180 mg by mouth daily., Disp: , Rfl: ;  fluticasone (FLONASE) 50 MCG/ACT nasal spray, as needed. , Disp: , Rfl: ;  SINGULAIR 10 MG tablet, , Disp: , Rfl: ;  VENTOLIN HFA 108 (90 BASE) MCG/ACT inhaler, as needed. , Disp: , Rfl: .   Health Maintenance  Topic Date Due  . Tetanus/tdap  12/06/1979  . Colonoscopy  12/05/2010  . Influenza Vaccine  06/10/2013    Allergies  Allergen Reactions  . Shellfish Allergy Nausea Only  . Iodine    Past Medical History  Diagnosis Date  . Diverticulitis   . Hemorrhoid   . Kidney stones   . Swollen lymph nodes     abd, groin,   . Allergy   . Asthma   . Hyperlipidemia   . Hypertension    Past Surgical History  Procedure Laterality Date  . Vasectomy  MID 90S   Family History  Problem Relation Age of Onset  . Diabetes Father   . Heart disease Father   . Heart failure Father   . Cancer Brother     Melanoma- left arm  . Atrial fibrillation Mother     History  Substance Use Topics  . Smoking status: Never Smoker   . Smokeless tobacco: Never Used  . Alcohol Use: 0.0 oz/week    1-2 Cans of beer per week     Comment: 2OZ A WEEK   ROS Constitutional: Denies fever, chills, weight loss/gain, headaches, insomnia, fatigue, night sweats, and change in appetite. Eyes: Denies redness, blurred vision, diplopia, discharge, itchy, watery  eyes.  ENT: Denies discharge, congestion, post nasal drip, epistaxis, sore throat, earache, hearing loss, dental pain, Tinnitus, Vertigo, Sinus pain, snoring.  Cardio: Denies chest pain, palpitations, irregular heartbeat, syncope, dyspnea, diaphoresis, orthopnea, PND, claudication, edema Respiratory: denies cough, dyspnea, DOE, pleurisy, hoarseness, laryngitis, wheezing.  Gastrointestinal: Denies dysphagia, heartburn, reflux, water brash, pain, cramps, nausea, vomiting, bloating, diarrhea, constipation, hematemesis, melena, hematochezia, jaundice, hemorrhoids Genitourinary: Denies dysuria, frequency, urgency, nocturia, hesitancy, discharge, hematuria, flank pain Breast:Breast lumps, nipple discharge, bleeding.  Musculoskeletal: Denies arthralgia, myalgia, stiffness, Jt. Swelling, pain, limp, and strain/sprain. Skin: Denies puritis, rash, hives, warts, acne, eczema, changing in skin lesion Neuro: Weakness, tremor, incoordination, spasms, paresthesia, pain Psychiatric: Denies confusion, memory loss, sensory loss Endocrine: Denies change in weight, skin, hair change, nocturia, and paresthesia, Diabetic Polys, visual blurring, hyper /hypo glycemic episodes.  Heme/Lymph: Denies Excessive bleeding, bruising, enlarged lymph nodes Filed Vitals:   09/13/13 0941  BP: 132/78  Pulse: 88  Temp: 97.9 F (36.6 C)  Resp: 16   Filed Vitals:   09/13/13 0941  Height: 6\' 1"  (1.854 m)  Weight: 269 lb (122.018 kg)   Physical Exam: General Appearance: Well nourished, in no apparent distress. Eyes: PERRLA, EOMs, conjunctiva no swelling or erythema, normal fundi and vessels. Sinuses: No Frontal/maxillary tenderness ENT/Mouth: Ext aud canals clear, normal light reflex with TMs without erythema, bulging. Nares clear with no erythema,  swelling, mucus on turbinates. No ulcers, cracking, on lips. Good dentition. No erythema, swelling, or exudate on post pharynx. Tongue normal, non-obstructing. Tonsils not swollen or  erythematous. Hearing normal.  Neck: Supple, thyroid normal. No bruits or JVD. Respiratory: Respiratory effort normal, BS equal bilaterally without rales, rhonci, wheezing or stridor. Cardio: Heart sounds normal, regular rate and rhythm without murmurs, rubs or gallops. Peripheral pulses brisk and equal bilaterally, without edema. No aortic or femoral bruits. Chest: symmetric, with normal excursions and percussion. Breasts: Symmetric, without lumps, nipple discharge, retractions, or fibrocystic changes.  Abdomen: Obese, soft, with bowl sounds. Nontender, no guarding, rebound, hernias, masses, or organomegaly.  Rectal: defer  Lymphatics: Non tender without lymphadenopathy.  Genitourinary: defer Musculoskeletal: Full ROM all peripheral extremities, joint stability, 5/5 strength, and normal gait. Skin: Warm, dry without rashes, lesions, ecchymosis.  Neuro: Cranial nerves intact, reflexes equal bilaterally. Normal muscle tone, no cerebellar symptoms. Sensation intact.  Pysch: Awake and oriented X 3, normal affect, Insight and Judgment appropriate.   Assessment and Plan Patient Active Problem List   Diagnosis Date Noted  . Other and unspecified hyperlipidemia 08/24/2013  . Seasonal allergies 08/24/2013  . Unspecified asthma(493.90) 08/24/2013  . OBSTRUCTIVE SLEEP APNEA 10/16/2010   Allergies- refill medications sent Asthma- refill albuterol and take as needed. Weight loss advised.  OSA- wants to continue weight loss.  Morbid obesity- continue weight loss, patient given handouts on weight loss.  Hyperlipidemia- Patient refuses medications at this time. Diet  And exercise discussed with patient.   Quentin Mulling

## 2013-09-13 NOTE — Patient Instructions (Signed)

## 2013-09-14 LAB — URINALYSIS, COMPLETE
Bilirubin Urine: NEGATIVE
Crystals: NONE SEEN
Glucose, UA: NEGATIVE mg/dL
Protein, ur: NEGATIVE mg/dL
Squamous Epithelial / LPF: NONE SEEN

## 2013-09-14 LAB — MICROALBUMIN / CREATININE URINE RATIO: Creatinine, Urine: 210.4 mg/dL

## 2013-09-14 LAB — PSA: PSA: 1.33 ng/mL (ref <=4.00)

## 2013-09-15 LAB — CBC WITH DIFFERENTIAL/PLATELET
Basophils Absolute: 0 10*3/uL (ref 0.0–0.1)
Basophils Relative: 0 % (ref 0–1)
Eosinophils Absolute: 0.2 10*3/uL (ref 0.0–0.7)
MCH: 30.2 pg (ref 26.0–34.0)
MCHC: 35.2 g/dL (ref 30.0–36.0)
Neutrophils Relative %: 71 % (ref 43–77)
Platelets: 277 10*3/uL (ref 150–400)
RBC: 5.5 MIL/uL (ref 4.22–5.81)
RDW: 14.2 % (ref 11.5–15.5)

## 2013-09-15 LAB — BASIC METABOLIC PANEL
Calcium: 9.5 mg/dL (ref 8.4–10.5)
Sodium: 140 mEq/L (ref 135–145)

## 2013-09-15 MED ORDER — PRAVASTATIN SODIUM 20 MG PO TABS: 20.0000 mg | ORAL_TABLET | Freq: Every evening | ORAL | Status: DC

## 2013-09-15 NOTE — Addendum Note (Signed)
Addended by: Quentin Mulling R on: 09/15/2013 01:09 PM   Modules accepted: Orders

## 2013-09-16 ENCOUNTER — Telehealth: Payer: Self-pay

## 2013-09-16 NOTE — Telephone Encounter (Signed)
Patient aware of lab results and instructions , declines Pravastatin and states that he will follow a stricter diet and follow up as planned with Marchelle Folks during visit in May, Amanda made aware

## 2013-09-16 NOTE — Telephone Encounter (Signed)
Message copied by Linwood Dibbles on Fri Sep 16, 2013 10:20 AM ------      Message from: Quentin Mulling R      Created: Thu Sep 15, 2013  1:10 PM       CBC and BMP are both normal. ------

## 2013-09-16 NOTE — Telephone Encounter (Signed)
Message copied by Linwood Dibbles on Fri Sep 16, 2013 10:37 AM ------      Message from: Quentin Mulling R      Created: Thu Sep 15, 2013  1:10 PM       CBC and BMP are both normal. ------

## 2013-09-16 NOTE — Telephone Encounter (Signed)
Message copied by Linwood Dibbles on Fri Sep 16, 2013 10:37 AM ------      Message from: Quentin Mulling R      Created: Thu Sep 15, 2013  1:10 PM       Your Urine, magnesium, TSH, A1C, testosterone, PSA, LFTs, insulin, and vitamin D are all normal. Your LDL is not in range. Your LDL is the bad cholesterol that can lead to heart attack and stroke. To lower your number you can decrease your fatty foods, red meat, cheese, milk and increase fiber like whole grains and veggies. You can also add a fiber supplement like Metamucil or Benefiber. I would suggest starting a medication for your cholesterol like Pravastatin 20mg  1 pill at night. We will set up a 6 week Lab only to check your cholesterol and liver function. ------

## 2013-09-16 NOTE — Telephone Encounter (Signed)
Patient aware of lab results and instructions , declines Pravastatin and states that he will follow a stricter diet and follow up as planned with Amanda during visit in May, Amanda made aware  

## 2014-03-14 ENCOUNTER — Ambulatory Visit: Payer: Self-pay | Admitting: Physician Assistant

## 2014-03-25 ENCOUNTER — Other Ambulatory Visit: Payer: Self-pay | Admitting: Internal Medicine

## 2014-03-25 DIAGNOSIS — K5732 Diverticulitis of large intestine without perforation or abscess without bleeding: Secondary | ICD-10-CM

## 2014-03-25 MED ORDER — CIPROFLOXACIN HCL 750 MG PO TABS: 750.0000 mg | ORAL_TABLET | Freq: Two times a day (BID) | ORAL | Status: AC

## 2014-03-25 MED ORDER — METRONIDAZOLE 500 MG PO TABS: 500.0000 mg | ORAL_TABLET | Freq: Three times a day (TID) | ORAL | Status: AC

## 2014-04-12 ENCOUNTER — Encounter: Payer: Self-pay | Admitting: Physician Assistant

## 2014-04-12 ENCOUNTER — Ambulatory Visit (INDEPENDENT_AMBULATORY_CARE_PROVIDER_SITE_OTHER): Payer: 59 | Admitting: Physician Assistant

## 2014-04-12 VITALS — BP 122/82 | HR 88 | Temp 97.9°F | Resp 16 | Ht 73.0 in | Wt 270.0 lb

## 2014-04-12 DIAGNOSIS — G4733 Obstructive sleep apnea (adult) (pediatric): Secondary | ICD-10-CM

## 2014-04-12 DIAGNOSIS — E559 Vitamin D deficiency, unspecified: Secondary | ICD-10-CM

## 2014-04-12 DIAGNOSIS — J309 Allergic rhinitis, unspecified: Secondary | ICD-10-CM

## 2014-04-12 DIAGNOSIS — Z79899 Other long term (current) drug therapy: Secondary | ICD-10-CM

## 2014-04-12 DIAGNOSIS — E785 Hyperlipidemia, unspecified: Secondary | ICD-10-CM

## 2014-04-12 DIAGNOSIS — J302 Other seasonal allergic rhinitis: Secondary | ICD-10-CM

## 2014-04-12 DIAGNOSIS — J45909 Unspecified asthma, uncomplicated: Secondary | ICD-10-CM

## 2014-04-12 LAB — BASIC METABOLIC PANEL WITH GFR
BUN: 11 mg/dL (ref 6–23)
CALCIUM: 9.3 mg/dL (ref 8.4–10.5)
CO2: 27 meq/L (ref 19–32)
CREATININE: 0.99 mg/dL (ref 0.50–1.35)
Chloride: 104 mEq/L (ref 96–112)
GFR, Est Non African American: 87 mL/min
GLUCOSE: 91 mg/dL (ref 70–99)
Potassium: 4.3 mEq/L (ref 3.5–5.3)
Sodium: 138 mEq/L (ref 135–145)

## 2014-04-12 LAB — HEPATIC FUNCTION PANEL
ALBUMIN: 4.1 g/dL (ref 3.5–5.2)
ALT: 35 U/L (ref 0–53)
AST: 28 U/L (ref 0–37)
Alkaline Phosphatase: 67 U/L (ref 39–117)
BILIRUBIN TOTAL: 1.1 mg/dL (ref 0.2–1.2)
Bilirubin, Direct: 0.2 mg/dL (ref 0.0–0.3)
Indirect Bilirubin: 0.9 mg/dL (ref 0.2–1.2)
Total Protein: 7 g/dL (ref 6.0–8.3)

## 2014-04-12 LAB — LIPID PANEL
CHOLESTEROL: 219 mg/dL — AB (ref 0–200)
HDL: 42 mg/dL (ref >39)
LDL Cholesterol: 162 mg/dL — ABNORMAL HIGH (ref 0–99)
Total CHOL/HDL Ratio: 5.2 Ratio
Triglycerides: 76 mg/dL (ref <150)
VLDL: 15 mg/dL (ref 0–40)

## 2014-04-12 LAB — CBC WITH DIFFERENTIAL/PLATELET
BASOS PCT: 0 % (ref 0–1)
Basophils Absolute: 0 10*3/uL (ref 0.0–0.1)
EOS ABS: 0.2 10*3/uL (ref 0.0–0.7)
EOS PCT: 3 % (ref 0–5)
HCT: 44.5 % (ref 39.0–52.0)
Hemoglobin: 16 g/dL (ref 13.0–17.0)
LYMPHS ABS: 1.6 10*3/uL (ref 0.7–4.0)
Lymphocytes Relative: 24 % (ref 12–46)
MCH: 30.1 pg (ref 26.0–34.0)
MCHC: 36 g/dL (ref 30.0–36.0)
MCV: 83.6 fL (ref 78.0–100.0)
Monocytes Absolute: 0.6 10*3/uL (ref 0.1–1.0)
Monocytes Relative: 9 % (ref 3–12)
Neutro Abs: 4.3 10*3/uL (ref 1.7–7.7)
Neutrophils Relative %: 64 % (ref 43–77)
PLATELETS: 284 10*3/uL (ref 150–400)
RBC: 5.32 MIL/uL (ref 4.22–5.81)
RDW: 13.2 % (ref 11.5–15.5)
WBC: 6.7 10*3/uL (ref 4.0–10.5)

## 2014-04-12 LAB — MAGNESIUM: MAGNESIUM: 1.8 mg/dL (ref 1.5–2.5)

## 2014-04-12 LAB — TSH: TSH: 2.133 u[IU]/mL (ref 0.350–4.500)

## 2014-04-12 MED ORDER — ALBUTEROL SULFATE HFA 108 (90 BASE) MCG/ACT IN AERS: 2.0000 | INHALATION_SPRAY | RESPIRATORY_TRACT | Status: DC | PRN

## 2014-04-12 MED ORDER — MONTELUKAST SODIUM 10 MG PO TABS: 10.0000 mg | ORAL_TABLET | Freq: Every day | ORAL | Status: DC

## 2014-04-12 MED ORDER — FLUTICASONE PROPIONATE 50 MCG/ACT NA SUSP: 2.0000 | NASAL | Status: DC | PRN

## 2014-04-12 NOTE — Patient Instructions (Signed)

## 2014-04-12 NOTE — Progress Notes (Signed)
Assessment and Plan:  Hypertension: Continue medication, monitor blood pressure at home. Continue DASH diet. Cholesterol: Continue diet and exercise. Check cholesterol, declines medication at this time- will work on diet/exercise Pre-diabetes-Continue diet and exercise. Check A1C Vitamin D Def- check level and continue medications.  Obesity- continue diet and exercise  Continue diet and meds as discussed. Further disposition pending results of labs.  HPI 53 y.o. male  presents for 3 month follow up with hypertension, hyperlipidemia,  and vitamin D. His blood pressure has been controlled at home, today their BP is BP: 122/82 mmHg He does workout. He denies chest pain, shortness of breath, dizziness.  He is not on cholesterol medication and would prefer to do diet/exercise.  His cholesterol is not at goal. The cholesterol last visit was:   Lab Results  Component Value Date   CHOL 217* 09/13/2013   HDL 46 09/13/2013   LDLCALC 157* 09/13/2013   TRIG 70 09/13/2013   CHOLHDL 4.7 09/13/2013   Last A1C in the office was:  Lab Results  Component Value Date   HGBA1C 5.5 09/13/2013   Patient is on Vitamin D supplement.   He had a diverticulitis flare 2 weeks ago but took ABX to help decrease that.  He has stopped drinking and doing tread climber 30 mins several times a week.   Current Medications:  Current Outpatient Prescriptions on File Prior to Visit  Medication Sig Dispense Refill  . albuterol (VENTOLIN HFA) 108 (90 BASE) MCG/ACT inhaler Inhale 2 puffs into the lungs every 4 (four) hours as needed for wheezing.  1 Inhaler  6  . fexofenadine (ALLEGRA) 180 MG tablet Take 180 mg by mouth daily.      . fluticasone (FLONASE) 50 MCG/ACT nasal spray Place 2 sprays into both nostrils as needed for allergies.  16 g  3  . montelukast (SINGULAIR) 10 MG tablet Take 1 tablet (10 mg total) by mouth at bedtime.  30 tablet  6  . pravastatin (PRAVACHOL) 20 MG tablet Take 1 tablet (20 mg total) by mouth every  evening.  30 tablet  2   No current facility-administered medications on file prior to visit.   Medical History:  Past Medical History  Diagnosis Date  . Diverticulitis   . Hemorrhoid   . Kidney stones   . Swollen lymph nodes     abd, groin,   . Allergy   . Asthma   . Hyperlipidemia   . Hypertension    Allergies:  Allergies  Allergen Reactions  . Shellfish Allergy Nausea Only  . Iodine      Review of Systems: [X]  = complains of  [ ]  = denies  General: Fatigue [ ]  Fever [ ]  Chills [ ]  Weakness [ ]   Insomnia [ ]  Eyes: Redness [ ]  Blurred vision [ ]  Diplopia [ ]   ENT: Congestion [ ]  Sinus Pain [ ]  Post Nasal Drip [ ]  Sore Throat [ ]  Earache [ ]   Cardiac: Chest pain/pressure [ ]  SOB [ ]  Orthopnea [ ]   Palpitations [ ]   Paroxysmal nocturnal dyspnea[ ]  Claudication [ ]  Edema [ ]   Pulmonary: Cough [ ]  Wheezing[ ]   SOB [ ]   Snoring [ ]   GI: Nausea [ ]  Vomiting[ ]  Dysphagia[ ]  Heartburn[ ]  Abdominal pain [ ]  Constipation [ ] ; Diarrhea [ ] ; BRBPR [ ]  Melena[ ]  GU: Hematuria[ ]  Dysuria [ ]  Nocturia[ ]  Urgency [ ]   Hesitancy [ ]  Discharge [ ]  Neuro: Headaches[ ]  Vertigo[ ]  Paresthesias[ ]  Spasm [ ]   Speech changes [ ]  Incoordination [ ]   Ortho: Arthritis [ ]  Joint pain [ ]  Muscle pain [ ]  Joint swelling [ ]  Back Pain [ ]  Skin:  Rash [ ]   Pruritis [ ]  Change in skin lesion [ ]   Psych: Depression[ ]  Anxiety[ ]  Confusion [ ]  Memory loss [ ]   Heme/Lypmh: Bleeding [ ]  Bruising [ ]  Enlarged lymph nodes [ ]   Endocrine: Visual blurring [ ]  Paresthesia [ ]  Polyuria [ ]  Polydypsea [ ]    Heat/cold intolerance [ ]  Hypoglycemia [ ]   Family history- Review and unchanged Social history- Review and unchanged Physical Exam: BP 122/82  Pulse 88  Temp(Src) 97.9 F (36.6 C)  Resp 16  Ht 6\' 1"  (1.854 m)  Wt 270 lb (122.471 kg)  BMI 35.63 kg/m2 Wt Readings from Last 3 Encounters:  04/12/14 270 lb (122.471 kg)  09/13/13 269 lb (122.018 kg)  06/04/12 266 lb (120.657 kg)   General Appearance: Well  nourished, in no apparent distress. Eyes: PERRLA, EOMs, conjunctiva no swelling or erythema Sinuses: No Frontal/maxillary tenderness ENT/Mouth: Ext aud canals clear, TMs without erythema, bulging. No erythema, swelling, or exudate on post pharynx.  Tonsils not swollen or erythematous. Hearing normal.  Neck: Supple, thyroid normal.  Respiratory: Respiratory effort normal, BS equal bilaterally without rales, rhonchi, wheezing or stridor.  Cardio: RRR with no MRGs. Brisk peripheral pulses without edema.  Abdomen: Soft, + BS.  Non tender, no guarding, rebound, hernias, masses. Lymphatics: Non tender without lymphadenopathy.  Musculoskeletal: Full ROM, 5/5 strength, normal gait.  Skin: Warm, dry without rashes, lesions, ecchymosis.  Neuro: Cranial nerves intact. Normal muscle tone, no cerebellar symptoms. Sensation intact.  Psych: Awake and oriented X 3, normal affect, Insight and Judgment appropriate.    Vicie Mutters 10:34 AM

## 2014-04-13 LAB — VITAMIN D 25 HYDROXY (VIT D DEFICIENCY, FRACTURES): VIT D 25 HYDROXY: 35 ng/mL (ref 30–89)

## 2014-09-14 ENCOUNTER — Encounter: Payer: Self-pay | Admitting: Physician Assistant

## 2014-09-14 ENCOUNTER — Ambulatory Visit (INDEPENDENT_AMBULATORY_CARE_PROVIDER_SITE_OTHER): Payer: 59 | Admitting: Physician Assistant

## 2014-09-14 VITALS — BP 138/80 | HR 84 | Temp 97.7°F | Resp 16 | Ht 73.0 in | Wt 271.0 lb

## 2014-09-14 DIAGNOSIS — Z0001 Encounter for general adult medical examination with abnormal findings: Secondary | ICD-10-CM

## 2014-09-14 DIAGNOSIS — E785 Hyperlipidemia, unspecified: Secondary | ICD-10-CM

## 2014-09-14 DIAGNOSIS — R6889 Other general symptoms and signs: Secondary | ICD-10-CM

## 2014-09-14 LAB — CBC WITH DIFFERENTIAL/PLATELET
Basophils Absolute: 0 10*3/uL (ref 0.0–0.1)
Basophils Relative: 0 % (ref 0–1)
Eosinophils Absolute: 0.3 10*3/uL (ref 0.0–0.7)
Eosinophils Relative: 4 % (ref 0–5)
HCT: 46.1 % (ref 39.0–52.0)
HEMOGLOBIN: 16.4 g/dL (ref 13.0–17.0)
LYMPHS PCT: 23 % (ref 12–46)
Lymphs Abs: 1.6 10*3/uL (ref 0.7–4.0)
MCH: 29.7 pg (ref 26.0–34.0)
MCHC: 35.6 g/dL (ref 30.0–36.0)
MCV: 83.4 fL (ref 78.0–100.0)
MONO ABS: 0.7 10*3/uL (ref 0.1–1.0)
MONOS PCT: 10 % (ref 3–12)
NEUTROS ABS: 4.3 10*3/uL (ref 1.7–7.7)
Neutrophils Relative %: 63 % (ref 43–77)
Platelets: 263 10*3/uL (ref 150–400)
RBC: 5.53 MIL/uL (ref 4.22–5.81)
RDW: 14 % (ref 11.5–15.5)
WBC: 6.8 10*3/uL (ref 4.0–10.5)

## 2014-09-14 NOTE — Progress Notes (Signed)
Complete Physical  Assessment and Plan: Diverticulitis- increase fiber, continue follow ups with Dr. Earlean Shawl  Hemorrhoid- increase fiber  Kidney stones- increase fluids  Swollen lymph nodes- will continue to monitor.   Allergy- continue medications  Asthma- controlled  Hyperlipidemia--continue medications, check lipids, decrease fatty foods, increase activity.   Hypertension-- continue medications, DASH diet, exercise and monitor at home. Call if greater than 130/80.   Obesity with co morbidities- long discussion about weight loss, diet, and exercise, questionable OSA, declines study . Discussed med's effects and SE's. Screening labs and tests as requested with regular follow-up as recommended.  HPI Patient presents for a complete physical.    His blood pressure has been controlled at home, today their BP is BP: 138/80 mmHg He does workout. He denies chest pain, shortness of breath, dizziness.  He is not on cholesterol medication and denies myalgias. His cholesterol is at goal. The cholesterol last visit was:   Lab Results  Component Value Date   CHOL 219* 04/12/2014   HDL 42 04/12/2014   LDLCALC 162* 04/12/2014   TRIG 76 04/12/2014   CHOLHDL 5.2 04/12/2014   Last A1C in the office was:  Lab Results  Component Value Date   HGBA1C 5.5 09/13/2013   Patient is on Vitamin D supplement.   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 Mom died in Jan 14, 2023.  BMI is Body mass index is 35.76 kg/(m^2)., he is working on diet and exercise, he would like to  [facility] Readings from Last 3 Encounters:  09/14/14 271 lb (122.925 kg)  04/12/14 270 lb (122.471 kg)  09/13/13 269 lb (122.018 kg)   He has asthma/allergies and is on singulair , allegra, and has albuterol which  helps.   Current Medications:  Current Outpatient Prescriptions on File Prior to Visit  Medication Sig Dispense Refill  . albuterol (VENTOLIN HFA) 108 (90 BASE) MCG/ACT inhaler Inhale 2 puffs into the lungs every 4 (four) hours as needed for wheezing. 1 Inhaler 6  . fexofenadine (ALLEGRA) 180 MG tablet Take 180 mg by mouth daily.    . fluticasone (FLONASE) 50 MCG/ACT nasal spray Place 2 sprays into both nostrils as needed for allergies. 16 g 3  . montelukast (SINGULAIR) 10 MG tablet Take 1 tablet (10 mg total) by mouth at bedtime. 30 tablet 6   No current facility-administered medications on file prior to visit.   Health Maintenance:  Immunization History  Administered Date(s) Administered  . DTaP 08/25/2011  . Influenza Whole 08/29/2013   Tetanus: 2012 Pneumovax: Flu vaccine:2015 Zostavax: DEXA: Colonoscopy: 04/2010 due 10 years with Dr. Earlean Shawl EGD:  Allergies:  Allergies  Allergen Reactions  . Shellfish Allergy Nausea Only  . Iodine    Medical History:  Past Medical History  Diagnosis Date  . Diverticulitis   . Hemorrhoid   . Kidney stones   . Swollen lymph nodes     abd, groin,   . Allergy   . Asthma   . Hyperlipidemia   . Hypertension    Surgical History:  Past Surgical History  Procedure Laterality Date  . Vasectomy  MID 90S   Family History:  Family History  Problem Relation Age of Onset  . Diabetes Father   .  Heart disease Father   . Heart failure Father   . Cancer Brother     Melanoma- left arm  . Atrial fibrillation Mother    Social History:   History  Substance Use Topics  . Smoking status: Never Smoker   . Smokeless tobacco: Never Used  . Alcohol Use: 0.0 oz/week    1-2 Cans of beer per week     Comment: 2OZ A WEEK   Review of Systems: [X]  = complains of  [ ]  = denies  General: Fatigue [ ]  Fever [ ]  Chills [ ]  Weakness [ ]   Insomnia [ ] Weight change [ ]  Night sweats [ ]   Change in appetite [ ]  Head: Head Trauma [ ]  Eyes: Wears  glasses or corrective lens [ ]  Redness [ ]  Blurred vision [ ]  Diplopia [ ]  Discharge [ ]  Floaters [ ]  LNL:[id number] [ ]  hearing loss [ ]  Tinnitus [ ]  Ear Discharge [ ]   Congestion [ ]  Sinus Pain [ ]  Post Nasal Drip [ ]  Nose Bleeds [ ]  Rhinorrhea [ ]    Difficulty Swallowing [ ]  Snoring [ ]  Sore Throat [ ]  Cardiac:  Chest pain/pressure [ ]  SOB [ ]  Orthopnea [ ]   Palpitations [ ]   Paroxysmal nocturnal dyspnea[ ]  Claudication [ ]  Edema [ ]  Difficulty walking around block or climbing stairs [ ]  Pulmonary: Cough [ ]  Wheezing[ ]   SOB [ ]   Pleurisy [ ]  Asthma [ ]  GI: Nausea [ ]  Vomiting[ ]  Dysphagia[ ]  Heartburn[ ]  Abdominal pain [ ]  Constipation [ ] ; Diarrhea [ ]  BRBPR [ ]  Melena[ ]  Bloating [ ]  Hemorrhoids [ ]  Incontinence [ ]  GU: Hematuria[ ]  Dysuria [ ]  Nocturia[ ]  Urgency [ ]   Hesitancy [ ]  Discharge [ ]  Frequency [ ]  Incontinence [ ]   Neuro: Headaches[ ]  Vertigo[ ]  Paresthesias[ ]  Spasm [ ]  Speech changes [ ]  Incoordination [ ]  Dizziness [ ]  Numbness [ ]  Ortho: Arthritis [ ]  Joint pain [ ]  Muscle pain [ ]  Joint swelling [ ]  Back Pain [ ]  Weakness [ ]  Stiffness [ ]  Skin:  Rash [ ]   Pruritis [ ]  Change in skin lesion [ ]  Change in hair [ ]  Change in nails [ ]  Psych: Depression[ ]  Anxiety[ ]  Stress [ ]  Confusion [ ]  Memory loss [ ]   Heme/Lymph: Bleeding [ ]  Bruising [ ]  History of anemia [ ]  Enlarged lymph nodes [ ]   Endocrine: Visual blurring [ ]  Paresthesia [ ]  Polyuria [ ]  Polydipsia [ ]  Polyphagia [ ]   Heat/cold intolerance [ ]  Hypoglycemia [ ]  Thyroid Issues [ ]  Diabetes [ ]   Physical Exam: Estimated body mass index is 35.76 kg/(m^2) as calculated from the following:   Height as of this encounter: 6\' 1"  (1.740 m).   Weight as of this encounter: 271 lb (122.925 kg). BP 138/80 mmHg  Pulse 84  Temp(Src) 97.7 F (36.5 C)  Resp 16  Ht 6\' 1"  (1.854 m)  Wt 271 lb (122.925 kg)  BMI 35.76 kg/m2 General Appearance: Well nourished, in no apparent distress.  Eyes: PERRLA, EOMs, conjunctiva no swelling  or erythema, normal fundi and vessels.  Sinuses: No Frontal/maxillary tenderness  ENT/Mouth: Ext aud canals clear, normal light reflex with TMs without erythema, bulging. Good dentition. No erythema, swelling, or exudate on post pharynx. Tonsils not swollen or erythematous. Hearing normal.  Neck: Supple, thyroid normal. No bruits  Respiratory: Respiratory effort normal, BS equal bilaterally without rales, rhonchi, wheezing or stridor.  Cardio: RRR without murmurs,  rubs or gallops. Brisk peripheral pulses without edema.  Chest: symmetric, with normal excursions and percussion.  Abdomen: Soft, nontender, obese,  no guarding, rebound, hernias, masses, or organomegaly. .  Lymphatics: Non tender without lymphadenopathy.  Genitourinary: defer Musculoskeletal: Full ROM all peripheral extremities,5/5 strength, and normal gait.  Skin: Warm, dry without rashes, lesions, ecchymosis. Neuro: Cranial nerves intact, reflexes equal bilaterally. Normal muscle tone, no cerebellar symptoms. Sensation intact.  Psych: Awake and oriented X 3, normal affect, Insight and Judgment appropriate.   EKG: WNL no changes.  Vicie Mutters 9:13 AM [facility]

## 2014-09-14 NOTE — Patient Instructions (Addendum)
Dr. Baker Janus, "The End of Dieting."  For greek yogurt try the [employer]ik[employer] triple zero, Chobani 100, or the Light and Fit [place] 80 cal.  Benefiber is good for constipation/diarrhea/irritable bowel syndrome, it helps with weight loss and can help lower your bad cholesterol. Please do 1-2 TBSP in the morning in water, coffee, or tea. It can take up to a month before you can see a difference with your bowel movements. It is cheapest from [employer], [employer], [employer].  Your LDL is not in range. Your LDL is the bad cholesterol that can lead to heart attack and stroke. To lower your number you can decrease your fatty foods, red meat, cheese, milk and increase fiber like whole grains and veggies.       Bad carbs also include fruit juice, alcohol, and sweet tea. These are empty calories that do not signal to your brain that you are full.   Please remember the good carbs are still carbs which convert into sugar. So please measure them out no more than 1/2-1 cup of rice, oatmeal, pasta, and beans  Veggies are however free foods! Pile them on.   Not all fruit is created equal. Please see the list below, the fruit at the bottom is higher in sugars than the fruit at the top. Please avoid all dried fruits.

## 2014-09-15 LAB — HEMOGLOBIN A1C
Hgb A1c MFr Bld: 5.7 % — ABNORMAL HIGH (ref <5.7)
Mean Plasma Glucose: 117 mg/dL — ABNORMAL HIGH (ref <117)

## 2014-09-15 LAB — PSA: PSA: 1.24 ng/mL (ref <=4.00)

## 2014-09-15 LAB — VITAMIN D 25 HYDROXY (VIT D DEFICIENCY, FRACTURES): Vit D, 25-Hydroxy: 35 ng/mL (ref 30–89)

## 2014-09-15 LAB — URINALYSIS, ROUTINE W REFLEX MICROSCOPIC
BILIRUBIN URINE: NEGATIVE
Glucose, UA: NEGATIVE mg/dL
HGB URINE DIPSTICK: NEGATIVE
KETONES UR: NEGATIVE mg/dL
Leukocytes, UA: NEGATIVE
Nitrite: NEGATIVE
PH: 6 (ref 5.0–8.0)
Protein, ur: NEGATIVE mg/dL
Specific Gravity, Urine: 1.006 (ref 1.005–1.030)
Urobilinogen, UA: 0.2 mg/dL (ref 0.0–1.0)

## 2014-09-15 LAB — IRON AND TIBC
%SAT: 48 % (ref 20–55)
IRON: 161 ug/dL (ref 42–165)
TIBC: 334 ug/dL (ref 215–435)
UIBC: 173 ug/dL (ref 125–400)

## 2014-09-15 LAB — BASIC METABOLIC PANEL WITH GFR
BUN: 9 mg/dL (ref 6–23)
CO2: 25 mEq/L (ref 19–32)
Calcium: 9.5 mg/dL (ref 8.4–10.5)
Chloride: 99 mEq/L (ref 96–112)
Creat: 1.04 mg/dL (ref 0.50–1.35)
GFR, Est African American: >89 mL/min
GFR, Est Non African American: 82 mL/min
GLUCOSE: 91 mg/dL (ref 70–99)
POTASSIUM: 4.4 meq/L (ref 3.5–5.3)
Sodium: 137 mEq/L (ref 135–145)

## 2014-09-15 LAB — LIPID PANEL
Cholesterol: 196 mg/dL (ref 0–200)
HDL: 45 mg/dL (ref >39)
LDL Cholesterol: 137 mg/dL — ABNORMAL HIGH (ref 0–99)
Total CHOL/HDL Ratio: 4.4 Ratio
Triglycerides: 69 mg/dL (ref <150)
VLDL: 14 mg/dL (ref 0–40)

## 2014-09-15 LAB — MAGNESIUM: MAGNESIUM: 1.8 mg/dL (ref 1.5–2.5)

## 2014-09-15 LAB — MICROALBUMIN / CREATININE URINE RATIO
CREATININE, URINE: 63.3 mg/dL
Microalb, Ur: <0.2 mg/dL (ref <2.0)

## 2014-09-15 LAB — HEPATIC FUNCTION PANEL
ALT: 29 U/L (ref 0–53)
AST: 26 U/L (ref 0–37)
Albumin: 4.3 g/dL (ref 3.5–5.2)
Alkaline Phosphatase: 70 U/L (ref 39–117)
BILIRUBIN DIRECT: 0.2 mg/dL (ref 0.0–0.3)
BILIRUBIN INDIRECT: 1 mg/dL (ref 0.2–1.2)
TOTAL PROTEIN: 6.8 g/dL (ref 6.0–8.3)
Total Bilirubin: 1.2 mg/dL (ref 0.2–1.2)

## 2014-09-15 LAB — TSH: TSH: 2.266 u[IU]/mL (ref 0.350–4.500)

## 2014-09-15 LAB — INSULIN, FASTING: INSULIN FASTING, SERUM: 10.1 u[IU]/mL (ref 2.0–19.6)

## 2014-09-15 LAB — VITAMIN B12: VITAMIN B 12: 372 pg/mL (ref 211–911)

## 2014-09-15 LAB — FERRITIN: Ferritin: 314 ng/mL (ref 22–322)

## 2014-09-15 LAB — TESTOSTERONE: Testosterone: 650 ng/dL (ref 300–890)

## 2014-11-17 ENCOUNTER — Other Ambulatory Visit: Payer: Self-pay | Admitting: Physician Assistant

## 2014-11-21 ENCOUNTER — Other Ambulatory Visit: Payer: Self-pay

## 2014-11-21 MED ORDER — ALBUTEROL SULFATE HFA 108 (90 BASE) MCG/ACT IN AERS: 2.0000 | INHALATION_SPRAY | RESPIRATORY_TRACT | Status: DC | PRN

## 2014-12-14 ENCOUNTER — Ambulatory Visit: Payer: Self-pay | Admitting: Physician Assistant

## 2015-02-13 ENCOUNTER — Ambulatory Visit: Payer: Self-pay | Admitting: Physician Assistant

## 2015-02-28 ENCOUNTER — Ambulatory Visit: Payer: Self-pay | Admitting: Physician Assistant

## 2015-03-19 ENCOUNTER — Other Ambulatory Visit: Payer: Self-pay | Admitting: Internal Medicine

## 2015-03-19 DIAGNOSIS — J4520 Mild intermittent asthma, uncomplicated: Secondary | ICD-10-CM

## 2015-03-19 DIAGNOSIS — J302 Other seasonal allergic rhinitis: Secondary | ICD-10-CM

## 2015-03-19 MED ORDER — FLUTICASONE PROPIONATE 50 MCG/ACT NA SUSP: 2.0000 | NASAL | Status: DC | PRN

## 2015-03-19 MED ORDER — ALBUTEROL SULFATE HFA 108 (90 BASE) MCG/ACT IN AERS: 2.0000 | INHALATION_SPRAY | RESPIRATORY_TRACT | Status: DC | PRN

## 2015-03-19 MED ORDER — MONTELUKAST SODIUM 10 MG PO TABS: ORAL_TABLET | ORAL | Status: DC

## 2015-04-04 ENCOUNTER — Ambulatory Visit (INDEPENDENT_AMBULATORY_CARE_PROVIDER_SITE_OTHER): Payer: 59 | Admitting: Internal Medicine

## 2015-04-04 ENCOUNTER — Encounter: Payer: Self-pay | Admitting: Internal Medicine

## 2015-04-04 VITALS — BP 138/70 | HR 110 | Temp 98.2°F | Resp 18 | Ht 73.0 in | Wt 270.0 lb

## 2015-04-04 DIAGNOSIS — J309 Allergic rhinitis, unspecified: Secondary | ICD-10-CM

## 2015-04-04 MED ORDER — AZELASTINE HCL 0.1 % NA SOLN: 2.0000 | Freq: Two times a day (BID) | NASAL | Status: DC

## 2015-04-04 MED ORDER — AZITHROMYCIN 250 MG PO TABS: ORAL_TABLET | ORAL | Status: DC

## 2015-04-04 MED ORDER — PREDNISONE 20 MG PO TABS: ORAL_TABLET | ORAL | Status: DC

## 2015-04-04 NOTE — Progress Notes (Signed)
Patient ID: SEAB AXEL, male   DOB: 06-12-1961, 54 y.o.   MRN: [id number]  HPI  Patient presents to the office for evaluation of cough.  It has been going on for 3 days.  Patient reports night > day, wet, green sputum production with occasional wheeze.  They also endorse postnasal drip and wheezing, nasal congestion sore throat.  They have tried none.  They report that nothing has worked.  They denies other sick contacts.  He does have severe seasonal allergies which have been doing all right but in the last several days he has been having a hard time.    Review of Systems  Constitutional: Positive for malaise/fatigue. Negative for fever and chills.  HENT: Positive for congestion and sore throat. Negative for ear discharge, ear pain and nosebleeds.   Respiratory: Positive for cough, sputum production, shortness of breath and wheezing.   Cardiovascular: Negative for chest pain, palpitations and leg swelling.  Neurological: Negative for headaches.    PE:  General:  Alert and non-toxic, WDWN, NAD HEENT: NCAT, PERLA, EOM normal, no occular discharge or erythema.  Nasal mucosal edema with sinus tenderness to palpation.  Oropharynx clear with minimal oropharyngeal edema and erythema.  Mucous membranes moist and pink. Neck:  Cervical adenopathy Chest:  RRR no MRGs.  Lungs clear to auscultation A&P with no wheezes rhonchi or rales.   Abdomen: +BS x 4 quadrants, soft, non-tender, no guarding, rigidity, or rebound. Skin: warm and dry no rash Neuro: A&Ox4, CN II-XII grossly intact  . Assessment and Plan:   1. Allergic rhinitis, unspecified allergic rhinitis type -nasal saline -mucinex -wear mask when cutting grass - predniSONE (DELTASONE) 20 MG tablet; 3 tabs po day one, then 2 tabs daily x 4 days  Dispense: 11 tablet; Refill: 0 - azelastine (ASTELIN) 0.1 % nasal spray; Place 2 sprays into both nostrils 2 (two) times daily. Use in each nostril as directed  Dispense: 30 mL; Refill: 2 -  azithromycin (ZITHROMAX Z-PAK) 250 MG tablet; 2 po day one, then 1 daily x 4 days  Dispense: 5 tablet; Refill: 0

## 2015-04-04 NOTE — Patient Instructions (Signed)
Allergic Rhinitis Allergic rhinitis is when the mucous membranes in the nose respond to allergens. Allergens are particles in the air that cause your body to have an allergic reaction. This causes you to release allergic antibodies. Through a chain of events, these eventually cause you to release histamine into the blood stream. Although meant to protect the body, it is this release of histamine that causes your discomfort, such as frequent sneezing, congestion, and an itchy, runny nose.  CAUSES  Seasonal allergic rhinitis (hay fever) is caused by pollen allergens that may come from grasses, trees, and weeds. Year-round allergic rhinitis (perennial allergic rhinitis) is caused by allergens such as house dust mites, pet dander, and mold spores.  SYMPTOMS   Nasal stuffiness (congestion).  Itchy, runny nose with sneezing and tearing of the eyes. DIAGNOSIS  Your health care provider can help you determine the allergen or allergens that trigger your symptoms. If you and your health care provider are unable to determine the allergen, skin or blood testing may be used. TREATMENT  Allergic rhinitis does not have a cure, but it can be controlled by:  Medicines and allergy shots (immunotherapy).  Avoiding the allergen. Hay fever may often be treated with antihistamines in pill or nasal spray forms. Antihistamines block the effects of histamine. There are over-the-counter medicines that may help with nasal congestion and swelling around the eyes. Check with your health care provider before taking or giving this medicine.  If avoiding the allergen or the medicine prescribed do not work, there are many new medicines your health care provider can prescribe. Stronger medicine may be used if initial measures are ineffective. Desensitizing injections can be used if medicine and avoidance does not work. Desensitization is when a patient is given ongoing shots until the body becomes less sensitive to the allergen.  Make sure you follow up with your health care provider if problems continue. HOME CARE INSTRUCTIONS It is not possible to completely avoid allergens, but you can reduce your symptoms by taking steps to limit your exposure to them. It helps to know exactly what you are allergic to so that you can avoid your specific triggers. SEEK MEDICAL CARE IF:   You have a fever.  You develop a cough that does not stop easily (persistent).  You have shortness of breath.  You start wheezing.  Symptoms interfere with normal daily activities. Document Released: 07/22/2001 Document Revised: 11/01/2013 Document Reviewed: 07/04/2013 [facility] Patient Information 2015 [place], [place]. This information is not intended to replace advice given to you by your health care provider. Make sure you discuss any questions you have with your health care provider.  Phentermine tablets or capsules What is this medicine? PHENTERMINE (FEN ter meen) decreases your appetite. It is used with a reduced calorie diet and exercise to help you lose weight. This medicine may be used for other purposes; ask your health care provider or pharmacist if you have questions. COMMON BRAND NAME(S): Adipex-P, Atti-Plex P, Atti-Plex P Spansule, Fastin, Pro-Fast, Tara-8 What should I tell my health care provider before I take this medicine? They need to know if you have any of these conditions: -agitation -glaucoma -heart disease -high blood pressure -history of substance abuse -lung disease called Primary Pulmonary Hypertension (PPH) -taken an MAOI like Carbex, Eldepryl, Marplan, Nardil, or Parnate in last 14 days -thyroid disease -an unusual or allergic reaction to phentermine, other medicines, foods, dyes, or preservatives -pregnant or trying to get pregnant -breast-feeding How should I use this medicine? Take this medicine  by mouth with a glass of water. Follow the directions on the prescription label. This medicine is usually taken  30 minutes before or 1 to 2 hours after breakfast. Avoid taking this medicine in the evening. It may interfere with sleep. Take your doses at regular intervals. Do not take your medicine more often than directed. Talk to your pediatrician regarding the use of this medicine in children. Special care may be needed. Overdosage: If you think you have taken too much of this medicine contact a poison control center or emergency room at once. NOTE: This medicine is only for you. Do not share this medicine with others. What if I miss a dose? If you miss a dose, take it as soon as you can. If it is almost time for your next dose, take only that dose. Do not take double or extra doses. What may interact with this medicine? Do not take this medicine with any of the following medications: -duloxetine -MAOIs like Carbex, Eldepryl, Marplan, Nardil, and Parnate -medicines for colds or breathing difficulties like pseudoephedrine or phenylephrine -procarbazine -sibutramine -SSRIs like citalopram, escitalopram, fluoxetine, fluvoxamine, paroxetine, and sertraline -stimulants like dexmethylphenidate, methylphenidate or modafinil -venlafaxine This medicine may also interact with the following medications: -medicines for diabetes This list may not describe all possible interactions. Give your health care provider a list of all the medicines, herbs, non-prescription drugs, or dietary supplements you use. Also tell them if you smoke, drink alcohol, or use illegal drugs. Some items may interact with your medicine. What should I watch for while using this medicine? Notify your physician immediately if you become short of breath while doing your normal activities. Do not take this medicine within 6 hours of bedtime. It can keep you from getting to sleep. Avoid drinks that contain caffeine and try to stick to a regular bedtime every night. This medicine was intended to be used in addition to a healthy diet and exercise.  The best results are achieved this way. This medicine is only indicated for short-term use. Eventually your weight loss may level out. At that point, the drug will only help you maintain your new weight. Do not increase or in any way change your dose without consulting your doctor. You may get drowsy or dizzy. Do not drive, use machinery, or do anything that needs mental alertness until you know how this medicine affects you. Do not stand or sit up quickly, especially if you are an older patient. This reduces the risk of dizzy or fainting spells. Alcohol may increase dizziness and drowsiness. Avoid alcoholic drinks. What side effects may I notice from receiving this medicine? Side effects that you should report to your doctor or health care professional as soon as possible: -chest pain, palpitations -depression or severe changes in mood -increased blood pressure -irritability -nervousness or restlessness -severe dizziness -shortness of breath -problems urinating -unusual swelling of the legs -vomiting Side effects that usually do not require medical attention (report to your doctor or health care professional if they continue or are bothersome): -blurred vision or other eye problems -changes in sexual ability or desire -constipation or diarrhea -difficulty sleeping -dry mouth or unpleasant taste -headache -nausea This list may not describe all possible side effects. Call your doctor for medical advice about side effects. You may report side effects to FDA at 1-800-FDA-1088. Where should I keep my medicine? Keep out of the reach of children. This medicine can be abused. Keep your medicine in a safe place to protect it from theft.  Do not share this medicine with anyone. Selling or giving away this medicine is dangerous and against the law. Store at room temperature between 20 and 25 degrees C (68 and 77 degrees F). Keep container tightly closed. Throw away any unused medicine after the  expiration date. NOTE: This sheet is a summary. It may not cover all possible information. If you have questions about this medicine, talk to your doctor, pharmacist, or health care provider.  2015, [employer]vier/[employer] Standard. (2010-12-11 11:02:44)

## 2015-04-06 ENCOUNTER — Ambulatory Visit: Payer: Self-pay | Admitting: Physician Assistant

## 2015-05-10 ENCOUNTER — Ambulatory Visit (INDEPENDENT_AMBULATORY_CARE_PROVIDER_SITE_OTHER): Payer: 59 | Admitting: Internal Medicine

## 2015-05-10 VITALS — BP 136/82 | HR 104 | Temp 98.2°F | Resp 18 | Ht 73.0 in | Wt 269.0 lb

## 2015-05-10 DIAGNOSIS — H6091 Unspecified otitis externa, right ear: Secondary | ICD-10-CM

## 2015-05-10 MED ORDER — NEOMYCIN-POLYMYXIN-HC 3.5-10000-1 OT SOLN: 4.0000 [drp] | Freq: Three times a day (TID) | OTIC | Status: DC

## 2015-05-10 NOTE — Patient Instructions (Signed)

## 2015-05-10 NOTE — Progress Notes (Signed)
   Subjective:    Patient ID: David Fuller, male    DOB: January 25, 1961, 54 y.o.   MRN: [id number]  Otalgia  Pertinent negatives include no ear discharge, rhinorrhea or sore throat.   Patient presents to the office for evaluation of right ear pain which has been going on for 2 days.  He recently has been doing a whole lot of swimming with his family.  It feels like it has been clogged and has been tender to the touch.  He reports no drainage that he is aware of.     Review of Systems  Constitutional: Negative for fever, chills and fatigue.  HENT: Positive for ear pain. Negative for congestion, ear discharge, postnasal drip, rhinorrhea, sore throat and trouble swallowing.   Respiratory: Negative for chest tightness and shortness of breath.        Objective:   Physical Exam  Constitutional: He is oriented to person, place, and time. He appears well-developed and well-nourished. No distress.  HENT:  Head: Normocephalic.  Right Ear: Hearing and external ear normal.  Mouth/Throat: Oropharynx is clear and moist. No oropharyngeal exudate.  Right ear canal with significant swelling.  No visible drainage.  Ear drum appears to be without perforation.    Eyes: Conjunctivae are normal. No scleral icterus.  Neck: Normal range of motion. Neck supple. No JVD present. No thyromegaly present.  Cardiovascular: Normal rate, regular rhythm, normal heart sounds and intact distal pulses.  Exam reveals no gallop and no friction rub.   No murmur heard. Pulmonary/Chest: Effort normal and breath sounds normal. No respiratory distress. He has no wheezes. He has no rales. He exhibits no tenderness.  Musculoskeletal: Normal range of motion.  Lymphadenopathy:    He has no cervical adenopathy.  Neurological: He is alert and oriented to person, place, and time.  Skin: Skin is warm and dry. He is not diaphoretic.  Psychiatric: He has a normal mood and affect. His behavior is normal. Judgment and thought content  normal.  Nursing note and vitals reviewed.         Assessment & Plan:    1. Right otitis externa -avoid water submersion -no evidence of ear drum rupture -swimmers ear drop usage after acute illness resolves.   - neomycin-polymyxin-hydrocortisone (CORTISPORIN) otic solution; Place 4 drops into the right ear 3 (three) times daily.  Dispense: 10 mL; Refill: 0

## 2015-05-11 ENCOUNTER — Ambulatory Visit: Payer: Self-pay | Admitting: Physician Assistant

## 2015-06-01 ENCOUNTER — Ambulatory Visit: Payer: Self-pay | Admitting: Physician Assistant

## 2015-07-08 ENCOUNTER — Ambulatory Visit (INDEPENDENT_AMBULATORY_CARE_PROVIDER_SITE_OTHER): Payer: 59

## 2015-07-08 ENCOUNTER — Ambulatory Visit (INDEPENDENT_AMBULATORY_CARE_PROVIDER_SITE_OTHER): Payer: 59 | Admitting: Emergency Medicine

## 2015-07-08 VITALS — BP 138/84 | HR 96 | Temp 98.7°F | Resp 20 | Ht 73.5 in | Wt 270.0 lb

## 2015-07-08 DIAGNOSIS — J988 Other specified respiratory disorders: Secondary | ICD-10-CM

## 2015-07-08 DIAGNOSIS — J4521 Mild intermittent asthma with (acute) exacerbation: Secondary | ICD-10-CM | POA: Diagnosis not present

## 2015-07-08 DIAGNOSIS — R05 Cough: Secondary | ICD-10-CM

## 2015-07-08 DIAGNOSIS — H6692 Otitis media, unspecified, left ear: Secondary | ICD-10-CM | POA: Diagnosis not present

## 2015-07-08 DIAGNOSIS — J4520 Mild intermittent asthma, uncomplicated: Secondary | ICD-10-CM

## 2015-07-08 DIAGNOSIS — R062 Wheezing: Secondary | ICD-10-CM

## 2015-07-08 DIAGNOSIS — R059 Cough, unspecified: Secondary | ICD-10-CM

## 2015-07-08 LAB — POCT CBC
Granulocyte percent: 77.1 %G (ref 37–80)
HCT, POC: 49.8 % (ref 43.5–53.7)
HEMOGLOBIN: 16.6 g/dL (ref 14.1–18.1)
LYMPH, POC: 1.6 (ref 0.6–3.4)
MCH, POC: 28.4 pg (ref 27–31.2)
MCHC: 33.2 g/dL (ref 31.8–35.4)
MCV: 85.6 fL (ref 80–97)
MID (CBC): 0.9 (ref 0–0.9)
MPV: 6.4 fL (ref 0–99.8)
POC Granulocyte: 8.3 — AB (ref 2–6.9)
POC LYMPH PERCENT: 14.9 %L (ref 10–50)
POC MID %: 8 %M (ref 0–12)
Platelet Count, POC: 307 10*3/uL (ref 142–424)
RBC: 5.82 M/uL (ref 4.69–6.13)
RDW, POC: 13.2 %
WBC: 10.8 10*3/uL — AB (ref 4.6–10.2)

## 2015-07-08 MED ORDER — AZITHROMYCIN 250 MG PO TABS: ORAL_TABLET | ORAL | Status: AC

## 2015-07-08 MED ORDER — ALBUTEROL SULFATE (2.5 MG/3ML) 0.083% IN NEBU: 2.5000 mg | INHALATION_SOLUTION | Freq: Four times a day (QID) | RESPIRATORY_TRACT | Status: DC | PRN

## 2015-07-08 MED ORDER — ALBUTEROL SULFATE (2.5 MG/3ML) 0.083% IN NEBU
2.5000 mg | INHALATION_SOLUTION | Freq: Once | RESPIRATORY_TRACT | Status: AC
  Administered 2015-07-08: 2.5 mg via RESPIRATORY_TRACT

## 2015-07-08 MED ORDER — ALBUTEROL SULFATE HFA 108 (90 BASE) MCG/ACT IN AERS: 2.0000 | INHALATION_SPRAY | RESPIRATORY_TRACT | Status: DC | PRN

## 2015-07-08 MED ORDER — IPRATROPIUM BROMIDE 0.02 % IN SOLN
0.5000 mg | Freq: Once | RESPIRATORY_TRACT | Status: AC
Start: 2015-07-08 — End: 2015-07-08
  Administered 2015-07-08: 0.5 mg via RESPIRATORY_TRACT

## 2015-07-08 MED ORDER — PREDNISONE 20 MG PO TABS: 40.0000 mg | ORAL_TABLET | Freq: Every day | ORAL | Status: AC

## 2015-07-08 NOTE — Progress Notes (Signed)
[facility] [address], [place] [address] [phone]  Date:  07/08/2015   Name:  David Fuller   DOB:  1961-05-22   MRN:  [id number]  PCP:  Alesia Richards, MD    History of Present Illness:  David Fuller is a 54 y.o. male patient who presents to [facility] for chief complaint of cough and sinus congestion.  Patient has had 1 day of productive cough of green sputum.  He also has rhinorrhea of a thick green sputum.  He has no sinus pressure or pain.  He has post-nasal drip, and will cough up similar green mucus.  He has noticed some wheezing sounds in his chest, but has says that he has no shortness of breath or dyspnea.  Has one sick contact of daughter one week ago, but did not exhibit any symptoms but a sore throat which lasted for about 8 hours.  He has asthma, but rarely will use his inhaler.  He states he attempted to do this today, because he was trying to get up the sputum.  He denies fever, chills, fatigue, dizziness, or abdominal pian or nausea.  Patient is an Financial controller.  No ear pain, tinnitus, or drainage.  Recent hx of otitis externa diagnosis about 2 months ago.  He had used the drops and thought that this ear problem had cleared up.       Patient Active Problem List   Diagnosis Date Noted  . Other and unspecified hyperlipidemia 08/24/2013  . Seasonal allergies 08/24/2013  . Unspecified asthma(493.90) 08/24/2013  . OBSTRUCTIVE SLEEP APNEA 10/16/2010    Past Medical History  Diagnosis Date  . Diverticulitis   . Hemorrhoid   . Kidney stones   . Swollen lymph nodes     abd, groin,   . Allergy   . Asthma   . Hyperlipidemia   . Hypertension     Past Surgical History  Procedure Laterality Date  . Vasectomy  MID 90S    Social History  Substance Use Topics  . Smoking status: Never Smoker   . Smokeless tobacco: Never Used  . Alcohol Use: 0.0 oz/week    1-2 Cans of beer per week     Comment: 2OZ A WEEK    Family  History  Problem Relation Age of Onset  . Diabetes Father   . Heart disease Father   . Heart failure Father   . Cancer Brother     Melanoma- left arm  . Atrial fibrillation Mother     Allergies  Allergen Reactions  . Shellfish Allergy Nausea Only  . Iodine     Medication list has been reviewed and updated.  Current Outpatient Prescriptions on File Prior to Visit  Medication Sig Dispense Refill  . albuterol (VENTOLIN HFA) 108 (90 BASE) MCG/ACT inhaler Inhale 2 puffs into the lungs every 4 (four) hours as needed for wheezing. 1 Inhaler 3  . fexofenadine (ALLEGRA) 180 MG tablet Take 180 mg by mouth daily.    . fluticasone (FLONASE) 50 MCG/ACT nasal spray Place 2 sprays into both nostrils as needed for allergies. 16 g 3  . montelukast (SINGULAIR) 10 MG tablet TAKE ONE TABLET AT BEDTIME. 30 tablet 3  . azelastine (ASTELIN) 0.1 % nasal spray Place 2 sprays into both nostrils 2 (two) times daily. Use in each nostril as directed (Patient not taking: Reported on 07/08/2015) 30 mL 2  . azithromycin (ZITHROMAX Z-PAK) 250 MG tablet 2 po day one, then 1  daily x 4 days (Patient not taking: Reported on 07/08/2015) 5 tablet 0  . neomycin-polymyxin-hydrocortisone (CORTISPORIN) otic solution Place 4 drops into the right ear 3 (three) times daily. (Patient not taking: Reported on 07/08/2015) 10 mL 0  . predniSONE (DELTASONE) 20 MG tablet 3 tabs po day one, then 2 tabs daily x 4 days (Patient not taking: Reported on 07/08/2015) 11 tablet 0   No current facility-administered medications on file prior to visit.    ROS ROS otherwise unremarkable unless listed above.  Physical Examination: BP 138/84 mmHg  Pulse 96  Temp(Src) 98.7 F (37.1 C) (Oral)  Resp 20  Ht 6' 1.5" (1.867 m)  Wt 270 lb (122.471 kg)  BMI 35.14 kg/m2  SpO2 98% Ideal Body Weight: Weight in (lb) to have BMI = 25: 191.7  Physical Exam  Constitutional: He is oriented to person, place, and time. He appears well-developed and  well-nourished. No distress.  HENT:  Head: Normocephalic and atraumatic.  Right Ear: External ear and ear canal normal. Tympanic membrane is erythematous and bulging. Tympanic membrane is not perforated.  Left Ear: Tympanic membrane, external ear and ear canal normal.  Mouth/Throat: No uvula swelling. Posterior oropharyngeal erythema present. No oropharyngeal exudate or posterior oropharyngeal edema.  Cardiovascular: Regular rhythm.  Exam reveals no gallop, no distant heart sounds and no friction rub.   No murmur heard. Pulses:      Radial pulses are 2+ on the right side, and 2+ on the left side.  Pulmonary/Chest: No accessory muscle usage. No apnea. No respiratory distress. He has wheezes (expiratory throughout lobes). He has rhonchi.  Lymphadenopathy:       Head (right side): No submandibular, no tonsillar, no preauricular and no posterior auricular adenopathy present.       Head (left side): No submandibular, no tonsillar, no preauricular and no posterior auricular adenopathy present.    He has no cervical adenopathy.  Neurological: He is alert and oriented to person, place, and time.  Skin: Skin is warm and dry.  Psychiatric: He has a normal mood and affect. His behavior is normal.   Peak Flow post-nebulizer: 440 =92% Results for orders placed or performed in visit on 07/08/15  POCT CBC  Result Value Ref Range   WBC 10.8 (A) 4.6 - 10.2 K/uL   Lymph, poc 1.6 0.6 - 3.4   POC LYMPH PERCENT 14.9 10 - 50 %L   MID (cbc) 0.9 0 - 0.9   POC MID % 8.0 0 - 12 %M   POC Granulocyte 8.3 (A) 2 - 6.9   Granulocyte percent 77.1 37 - 80 %G   RBC 5.82 4.69 - 6.13 M/uL   Hemoglobin 16.6 14.1 - 18.1 g/dL   HCT, POC 49.8 43.5 - 53.7 %   MCV 85.6 80 - 97 fL   MCH, POC 28.4 27 - 31.2 pg   MCHC 33.2 31.8 - 35.4 g/dL   RDW, POC 13.2 %   Platelet Count, POC 307 142 - 424 K/uL   MPV 6.4 0 - 99.8 fL   [facility] reading (PRIMARY) by  Dr. Everlene Farrier: Atelectasis of right base.  No consolidations.    Assessment  and Plan: 54 year old male with hx of asthma, seasonal allergies, and OSA, is here today for 1 day of wheezing and cough. -Treating both OM and respiratory infection with zpak to guard for atypical respiratory pathogen.  Prednisone burst to help open the airways.  Advised of inhaler use.      Wheezing -  Plan: POCT CBC, albuterol (PROVENTIL) (2.5 MG/3ML) 0.083% nebulizer solution 2.5 mg, ipratropium (ATROVENT) nebulizer solution 0.5 mg, DG Chest 2 View, predniSONE (DELTASONE) 20 MG tablet, albuterol (VENTOLIN HFA) 108 (90 BASE) MCG/ACT inhaler, albuterol (PROVENTIL) (2.5 MG/3ML) 0.083% nebulizer solution  Asthma with acute exacerbation, mild intermittent - Plan: predniSONE (DELTASONE) 20 MG tablet, albuterol (VENTOLIN HFA) 108 (90 BASE) MCG/ACT inhaler, albuterol (PROVENTIL) (2.5 MG/3ML) 0.083% nebulizer solution, azithromycin (ZITHROMAX) 250 MG tablet  Subacute otitis media of left ear, recurrence not specified, unspecified otitis media type  Respiratory infection - Plan: POCT CBC, albuterol (PROVENTIL) (2.5 MG/3ML) 0.083% nebulizer solution 2.5 mg, ipratropium (ATROVENT) nebulizer solution 0.5 mg, azithromycin (ZITHROMAX) 250 MG tablet  Cough - Plan: POCT CBC, DG Chest 2 View, predniSONE (DELTASONE) 20 MG tablet  Intrinsic asthma, mild intermittent, uncomplicated - Plan: albuterol (VENTOLIN HFA) 108 (90 BASE) MCG/ACT inhaler    Ivar Drape, PA-C [facility] 07/08/2015 9:45 AM

## 2015-07-08 NOTE — Patient Instructions (Signed)
Please continue to hydrate and use the mucinex. Take medication as prescribed.  Please let us know if you are not feeling any better.

## 2015-07-10 NOTE — Addendum Note (Signed)
Addended by: Ivar Drape D on: 07/10/2015 09:38 AM   Modules accepted: Level of Service

## 2015-07-22 ENCOUNTER — Other Ambulatory Visit: Payer: Self-pay | Admitting: Internal Medicine

## 2015-09-17 ENCOUNTER — Ambulatory Visit (INDEPENDENT_AMBULATORY_CARE_PROVIDER_SITE_OTHER): Payer: 59 | Admitting: Physician Assistant

## 2015-09-17 ENCOUNTER — Encounter: Payer: Self-pay | Admitting: Physician Assistant

## 2015-09-17 VITALS — BP 140/80 | HR 94 | Temp 97.9°F | Resp 16 | Ht 73.0 in | Wt 271.0 lb

## 2015-09-17 DIAGNOSIS — E785 Hyperlipidemia, unspecified: Secondary | ICD-10-CM

## 2015-09-17 DIAGNOSIS — K5792 Diverticulitis of intestine, part unspecified, without perforation or abscess without bleeding: Secondary | ICD-10-CM

## 2015-09-17 DIAGNOSIS — E559 Vitamin D deficiency, unspecified: Secondary | ICD-10-CM

## 2015-09-17 DIAGNOSIS — Z0001 Encounter for general adult medical examination with abnormal findings: Secondary | ICD-10-CM

## 2015-09-17 DIAGNOSIS — J45909 Unspecified asthma, uncomplicated: Secondary | ICD-10-CM

## 2015-09-17 DIAGNOSIS — J4520 Mild intermittent asthma, uncomplicated: Secondary | ICD-10-CM

## 2015-09-17 DIAGNOSIS — R7303 Prediabetes: Secondary | ICD-10-CM

## 2015-09-17 DIAGNOSIS — I10 Essential (primary) hypertension: Secondary | ICD-10-CM | POA: Diagnosis not present

## 2015-09-17 DIAGNOSIS — J302 Other seasonal allergic rhinitis: Secondary | ICD-10-CM

## 2015-09-17 DIAGNOSIS — R062 Wheezing: Secondary | ICD-10-CM

## 2015-09-17 DIAGNOSIS — Z79899 Other long term (current) drug therapy: Secondary | ICD-10-CM

## 2015-09-17 DIAGNOSIS — J4521 Mild intermittent asthma with (acute) exacerbation: Secondary | ICD-10-CM

## 2015-09-17 DIAGNOSIS — Z0000 Encounter for general adult medical examination without abnormal findings: Secondary | ICD-10-CM

## 2015-09-17 DIAGNOSIS — Z87442 Personal history of urinary calculi: Secondary | ICD-10-CM | POA: Insufficient documentation

## 2015-09-17 DIAGNOSIS — Z125 Encounter for screening for malignant neoplasm of prostate: Secondary | ICD-10-CM

## 2015-09-17 DIAGNOSIS — G4733 Obstructive sleep apnea (adult) (pediatric): Secondary | ICD-10-CM

## 2015-09-17 DIAGNOSIS — E669 Obesity, unspecified: Secondary | ICD-10-CM

## 2015-09-17 DIAGNOSIS — Z1389 Encounter for screening for other disorder: Secondary | ICD-10-CM

## 2015-09-17 HISTORY — DX: Diverticulitis of intestine, part unspecified, without perforation or abscess without bleeding: K57.92

## 2015-09-17 LAB — HEMOGLOBIN A1C
HEMOGLOBIN A1C: 5.6 % (ref <5.7)
MEAN PLASMA GLUCOSE: 114 mg/dL (ref <117)

## 2015-09-17 MED ORDER — MONTELUKAST SODIUM 10 MG PO TABS: ORAL_TABLET | ORAL | Status: DC

## 2015-09-17 MED ORDER — ALBUTEROL SULFATE HFA 108 (90 BASE) MCG/ACT IN AERS: 2.0000 | INHALATION_SPRAY | RESPIRATORY_TRACT | Status: DC | PRN

## 2015-09-17 MED ORDER — FLUTICASONE PROPIONATE 50 MCG/ACT NA SUSP: 2.0000 | NASAL | Status: DC | PRN

## 2015-09-17 NOTE — Patient Instructions (Signed)
We want weight loss that will last so you should lose 1-2 pounds a week.  THAT IS IT! Please pick THREE things a month to change. Once it is a habit check off the item. Then pick another three items off the list to become habits.  If you are already doing a habit on the list GREAT!  Cross that item off! o Don't drink your calories. Ie, alcohol, soda, fruit juice, and sweet tea.  o Drink more water. Drink a glass when you feel hungry or before each meal.  o Eat breakfast - Complex carb and protein (likeDannon light and fit yogurt, oatmeal, fruit, eggs, turkey bacon). o Measure your cereal.  Eat no more than one cup a day. (ie Kashi) o Eat an apple a day. o Add a vegetable a day. o Try a new vegetable a month. o Use Pam! Stop using oil or butter to cook. o Don't finish your plate or use smaller plates. o Share your dessert. o Eat sugar free Jello for dessert or frozen grapes. o Don't eat 2-3 hours before bed. o Switch to whole wheat bread, pasta, and brown rice. o Make healthier choices when you eat out. No fries! o Pick baked chicken, NOT fried. o Don't forget to SLOW DOWN when you eat. It is not going anywhere.  o Take the stairs. o Park far away in the parking lot o Lift soup cans (or weights) for 10 minutes while watching TV. o Walk at work for 10 minutes during break. o Walk outside 1 time a week with your friend, kids, dog, or significant other. o Start a walking group at church. o Walk the mall as much as you can tolerate.  o Keep a food diary. o Weigh yourself daily. o Walk for 15 minutes 3 days per week. o Cook at home more often and eat out less.  If life happens and you go back to old habits, it is okay.  Just start over. You can do it!   If you experience chest pain, get short of breath, or tired during the exercise, please stop immediately and inform your doctor.   Before you even begin to attack a weight-loss plan, it pays to remember this: You are not fat. You have fat.  Losing weight isn't about blame or shame; it's simply another achievement to accomplish. Dieting is like any other skill-you have to buckle down and work at it. As long as you act in a smart, reasonable way, you'll ultimately get where you want to be. Here are some weight loss pearls for you.  1. It's Not a Diet. It's a Lifestyle Thinking of a diet as something you're on and suffering through only for the short term doesn't work. To shed weight and keep it off, you need to make permanent changes to the way you eat. It's OK to indulge occasionally, of course, but if you cut calories temporarily and then revert to your old way of eating, you'll gain back the weight quicker than you can say yo-yo. Use it to lose it. Research shows that one of the best predictors of long-term weight loss is how many pounds you drop in the first month. For that reason, nutritionists often suggest being stricter for the first two weeks of your new eating strategy to build momentum. Cut out added sugar and alcohol and avoid unrefined carbs. After that, figure out how you can reincorporate them in a way that's healthy and maintainable.  2. There's a Right   Way to Exercise Working out burns calories and fat and boosts your metabolism by building muscle. But those trying to lose weight are notorious for overestimating the number of calories they burn and underestimating the amount they take in. Unfortunately, your system is biologically programmed to hold on to extra pounds and that means when you start exercising, your body senses the deficit and ramps up its hunger signals. If you're not diligent, you'll eat everything you burn and then some. Use it to lose it. Cardio gets all the exercise glory, but strength and interval training are the real heroes. They help you build lean muscle, which in turn increases your metabolism and calorie-burning ability 3. Don't Overreact to Mild Hunger Some people have a hard time losing weight because  of hunger anxiety. To them, being hungry is bad-something to be avoided at all costs-so they carry snacks with them and eat when they don't need to. Others eat because they're stressed out or bored. While you never want to get to the point of being ravenous (that's when bingeing is likely to happen), a hunger pang, a craving, or the fact that it's 3:00 p.m. should not send you racing for the vending machine or obsessing about the energy bar in your purse. Ideally, you should put off eating until your stomach is growling and it's difficult to concentrate.  Use it to lose it. When you feel the urge to eat, use the HALT method. Ask yourself, Am I really hungry? Or am I angry or anxious, lonely or bored, or tired? If you're still not certain, try the apple test. If you're truly hungry, an apple should seem delicious; if it doesn't, something else is going on. Or you can try drinking water and making yourself busy, if you are still hungry try a healthy snack.  4. Not All Calories Are Created Equal The mechanics of weight loss are pretty simple: Take in fewer calories than you use for energy. But the kind of food you eat makes all the difference. Processed food that's high in saturated fat and refined starch or sugar can cause inflammation that disrupts the hormone signals that tell your brain you're full. The result: You eat a lot more.  Use it to lose it. Clean up your diet. Swap in whole, unprocessed foods, including vegetables, lean protein, and healthy fats that will fill you up and give you the biggest nutritional bang for your calorie buck. In a few weeks, as your brain starts receiving regular hunger and fullness signals once again, you'll notice that you feel less hungry overall and naturally start cutting back on the amount you eat.  5. Protein, Produce, and Plant-Based Fats Are Your Weight-Loss Trinity Here's why eating the three Ps regularly will help you drop pounds. Protein fills you up. You need it  to build lean muscle, which keeps your metabolism humming so that you can torch more fat. People in a weight-loss program who ate double the recommended daily allowance for protein (about 110 grams for a 150-pound woman) lost 70 percent of their weight from fat, while people who ate the RDA lost only about 40 percent, one study found. Produce is packed with filling fiber. "It's very difficult to consume too many calories if you're eating a lot of vegetables. Example: Three cups of broccoli is a lot of food, yet only 93 calories. (Fruit is another story. It can be easy to overeat and can contain a lot of calories from sugar, so be sure to   monitor your intake.) Plant-based fats like olive oil and those in avocados and nuts are healthy and extra satiating.  Use it to lose it. Aim to incorporate each of the three Ps into every meal and snack. People who eat protein throughout the day are able to keep weight off, according to a study in the American Journal of Clinical Nutrition. In addition to meat, poultry and seafood, good sources are beans, lentils, eggs, tofu, and yogurt. As for fat, keep portion sizes in check by measuring out salad dressing, oil, and nut butters (shoot for one to two tablespoons). Finally, eat veggies or a little fruit at every meal. People who did that consumed 308 fewer calories but didn't feel any hungrier than when they didn't eat more produce.  7. How You Eat Is As Important As What You Eat In order for your brain to register that you're full, you need to focus on what you're eating. Sit down whenever you eat, preferably at a table. Turn off the TV or computer, put down your phone, and look at your food. Smell it. Chew slowly, and don't put another bite on your fork until you swallow. When women ate lunch this attentively, they consumed 30 percent less when snacking later than those who listened to an audiobook at lunchtime, according to a study in the [employer] Journal of Nutrition. 8.  Weighing Yourself Really Works The scale provides the best evidence about whether your efforts are paying off. Seeing the numbers tick up or down or stagnate is motivation to keep going-or to rethink your approach. A 2015 study at [employer] found that daily weigh-ins helped people lose more weight, keep it off, and maintain that loss, even after two years. Use it to lose it. Step on the scale at the same time every day for the best results. If your weight shoots up several pounds from one weigh-in to the next, don't freak out. Eating a lot of salt the night before or having your period is the likely culprit. The number should return to normal in a day or two. It's a steady climb that you need to do something about. 9. Too Much Stress and Too Little Sleep Are Your Enemies When you're tired and frazzled, your body cranks up the production of cortisol, the stress hormone that can cause carb cravings. Not getting enough sleep also boosts your levels of ghrelin, a hormone associated with hunger, while suppressing leptin, a hormone that signals fullness and satiety. People on a diet who slept only five and a half hours a night for two weeks lost 55 percent less fat and were hungrier than those who slept eight and a half hours, according to a study in the [employer] Journal. Use it to lose it. Prioritize sleep, aiming for seven hours or more a night, which research shows helps lower stress. And make sure you're getting quality zzz's. If a snoring spouse or a fidgety cat wakes you up frequently throughout the night, you may end up getting the equivalent of just four hours of sleep, according to a study from [employer]. Keep pets out of the bedroom, and use a white-noise app to drown out snoring. 10. You Will Hit a plateau-And You Can Bust Through It As you slim down, your body releases much less leptin, the fullness hormone.  If you're not strength training, start right now.  Building muscle can raise your metabolism to help you overcome a plateau. To keep your body challenged   and burning calories, incorporate new moves and more intense intervals into your workouts or add another sweat session to your weekly routine. Alternatively, cut an extra 100 calories or so a day from your diet. Now that you've lost weight, your body simply doesn't need as much fuel.   Ways to cut 100 calories  1. Eat your eggs with hot sauce OR salsa instead of cheese.  Eggs are great for breakfast, but many people consider eggs and cheese to be BFFs. Instead of cheese-1 oz. of cheddar has 114 calories-top your eggs with hot sauce, which contains no calories and helps with satiety and metabolism. Salsa is also a great option!!  2. Top your toast, waffles or pancakes with mashed berries instead of jelly or syrup. Half a cup of berries-fresh, frozen or thawed-has about 40 calories, compared with 2 tbsp. of maple syrup or jelly, which both have about 100 calories. The berries will also give you a good punch of fiber, which helps keep you full and satisfied and won't spike blood sugar quickly like the jelly or syrup. 3. Swap the non-fat latte for black coffee with a splash of half-and-half. Contrary to its name, that non-fat latte has 130 calories and a startling 19g of carbohydrates per 16 oz. serving. Replacing that 'light' drinkable dessert with a black coffee with a splash of half-and-half saves you more than 100 calories per 16 oz. serving. 4. Sprinkle salads with freeze-dried raspberries instead of dried cranberries. If you want a sweet addition to your nutritious salad, stay away from dried cranberries. They have a whopping 130 calories per  cup and 30g carbohydrates. Instead, sprinkle freeze-dried raspberries guilt-free and save more than 100 calories per  cup serving, adding 3g of belly-filling fiber. 5. Go for mustard in place of mayo on your sandwich. Mustard can add really nice flavor to any  sandwich, and there are tons of varieties, from spicy to honey. A serving of mayo is 95 calories, versus 10 calories in a serving of mustard. 6. Choose a DIY salad dressing instead of the store-bought kind. Mix Dijon or whole grain mustard with low-fat Kefir or red wine vinegar and garlic. 7. Use hummus as a spread instead of a dip. Use hummus as a spread on a high-fiber cracker or tortilla with a sandwich and save on calories without sacrificing taste. 8. Pick just one salad "accessory." Salad isn't automatically a calorie winner. It's easy to over-accessorize with toppings. Instead of topping your salad with nuts, avocado and cranberries (all three will clock in at 313 calories), just pick one. The next day, choose a different accessory, which will also keep your salad interesting. You don't wear all your jewelry every day, right? 9. Ditch the white pasta in favor of spaghetti squash. One cup of cooked spaghetti squash has about 40 calories, compared with traditional spaghetti, which comes with more than 200. Spaghetti squash is also nutrient-dense. It's a good source of fiber and Vitamins A and C, and it can be eaten just like you would eat pasta-with a great tomato sauce and turkey meatballs or with pesto, tofu and spinach, for example. 10. Dress up your chili, soups and stews with non-fat Greek yogurt instead of sour cream. Just a 'dollop' of sour cream can set you back 115 calories and a whopping 12g of fat-seven of which are of the artery-clogging variety. Added bonus: Greek yogurt is packed with muscle-building protein, calcium and B Vitamins. 11. Mash cauliflower instead of mashed potatoes. One cup of   traditional mashed potatoes-in all their creamy goodness-has more than 200 calories, compared to mashed cauliflower, which you can typically eat for less than 100 calories per 1 cup serving. Cauliflower is a great source of the antioxidant indole-3-carbinol (I3C), which may help reduce the risk of  some cancers, like breast cancer. 12. Ditch the ice cream sundae in favor of a Greek yogurt parfait. Instead of a cup of ice cream or fro-yo for dessert, try 1 cup of nonfat Greek yogurt topped with fresh berries and a sprinkle of cacao nibs. Both toppings are packed with antioxidants, which can help reduce cellular inflammation and oxidative damage. And the comparison is a no-brainer: One cup of ice cream has about 275 calories; one cup of frozen yogurt has about 230; and a cup of Greek yogurt has just 130, plus twice the protein, so you're less likely to return to the freezer for a second helping. 13. Put olive oil in a spray container instead of using it directly from the bottle. Each tablespoon of olive oil is 120 calories and 15g of fat. Use a mister instead of pouring it straight into the pan or onto a salad. This allows for portion control and will save you more than 100 calories. 14. When baking, substitute canned pumpkin for butter or oil. Canned pumpkin-not pumpkin pie mix-is loaded with Vitamin A, which is important for skin and eye health, as well as immunity. And the comparisons are pretty crazy:  cup of canned pumpkin has about 40 calories, compared to butter or oil, which has more than 800 calories. Yes, 800 calories. Applesauce and mashed banana can also serve as good substitutions for butter or oil, usually in a 1:1 ratio. 15. Top casseroles with high-fiber cereal instead of breadcrumbs. Breadcrumbs are typically made with white bread, while breakfast cereals contain 5-9g of fiber per serving. Not only will you save more than 150 calories per  cup serving, the swap will also keep you more full and you'll get a metabolism boost from the added fiber. 16. Snack on pistachios instead of macadamia nuts. Believe it or not, you get the same amount of calories from 35 pistachios (100 calories) as you would from only five macadamia nuts. 17. Chow down on kale chips rather than potato  chips. This is my favorite 'don't knock it 'till you try it' swap. Kale chips are so easy to make at home, and you can spice them up with a little grated parmesan or chili powder. Plus, they're a mere fraction of the calories of potato chips, but with the same crunch factor we crave so often. 18. Add seltzer and some fruit slices to your cocktail instead of soda or fruit juice. One cup of soda or fruit juice can pack on as much as 140 calories. Instead, use seltzer and fruit slices. The fruit provides valuable phytochemicals, such as flavonoids and anthocyanins, which help to combat cancer and stave off the aging process.  

## 2015-09-17 NOTE — Progress Notes (Signed)
Complete Physical  Assessment and Plan: 1. Asthma, unspecified asthma severity, uncomplicated Better with singulair, albuterol, but has cleaned out humidifer from house and doing better.   2. Hyperlipidemia -continue medications, check lipids, decrease fatty foods, increase activity.  - Lipid panel - EKG 12-Lead  3. Obstructive sleep apnea Weight loss advised  4. Seasonal allergies Continue medications  5. Encounter for general adult medical examination with abnormal findings - CBC with Differential/Platelet - BASIC METABOLIC PANEL WITH GFR - TSH - Hepatic function panel - Lipid panel - Hemoglobin A1c - Insulin, fasting - Magnesium - Vit D  25 hydroxy (rtn osteoporosis monitoring) - Urinalysis, Routine w reflex microscopic (not at [facility]) - Microalbumin / creatinine urine ratio - PSA - EKG 12-Lead  6. Essential hypertension - continue medications, DASH diet, exercise and monitor at home. Call if greater than 130/80.  - CBC with Differential/Platelet - BASIC METABOLIC PANEL WITH GFR - TSH - Hepatic function panel - EKG 12-Lead  7. Obesity Obesity with co morbidities- long discussion about weight loss, diet, and exercise - Find out which nutritionist is in network and i can send referral.   8. History of nephrolithiasis Increase fluids  9. Diverticulitis of intestine without perforation or abscess without bleeding Continue fiber  10. Prediabetes Discussed general issues about diabetes pathophysiology and management., Educational material distributed., Suggested low cholesterol diet., Encouraged aerobic exercise., Discussed foot care., Reminded to get yearly retinal exam. - Hemoglobin A1c - Insulin, fasting  11. Medication management - Magnesium  12. Vitamin D deficiency - Vit D  25 hydroxy (rtn osteoporosis monitoring)  13. Screening for prostate cancer - PSA  14. Screening for blood or protein in urine - Urinalysis, Routine w reflex microscopic (not at  [facility]) - Microalbumin / creatinine urine ratio  . Discussed med's effects and SE's. Screening labs and tests as requested with regular follow-up as recommended.  HPI Patient presents for a complete physical.    His blood pressure has been controlled at home, today their BP is BP: 140/80 mmHg He does workout, on tread climber. He denies chest pain, shortness of breath, dizziness.  He is not on cholesterol medication and denies myalgias. His cholesterol is at goal. The cholesterol last visit was:   Lab Results  Component Value Date   CHOL 196 09/14/2014   HDL 45 09/14/2014   LDLCALC 137* 09/14/2014   TRIG 69 09/14/2014   CHOLHDL 4.4 09/14/2014   Last A1C in the office was:  Lab Results  Component Value Date   HGBA1C 5.7* 09/14/2014   Patient is on Vitamin D supplement.   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 Mom died in 2023/02/04.  BMI is Body mass index is 35.76 kg/(m^2)., he is working on diet and exercise, he would like to see a nutritionist, and his work will pay for it.  Wt Readings from Last 3 Encounters:  09/17/15 271 lb (122.925 kg)  07/08/15 270 lb (122.471 kg)  05/10/15 269 lb (122.018 kg)   He has asthma/allergies and is on singulair , allegra, and has albuterol which helps.   Current Medications:  Current Outpatient Prescriptions on File Prior to Visit  Medication Sig Dispense Refill  . albuterol (PROVENTIL) (  2.5 MG/3ML) 0.083% nebulizer solution Take 3 mLs (2.5 mg total) by nebulization every 6 (six) hours as needed for wheezing or shortness of breath. 150 mL 1  . albuterol (VENTOLIN HFA) 108 (90 BASE) MCG/ACT inhaler Inhale 2 puffs into the lungs every 4 (four) hours as needed for wheezing. 1 Inhaler 3  . azelastine (ASTELIN) 0.1 % nasal  spray Place 2 sprays into both nostrils 2 (two) times daily. Use in each nostril as directed (Patient not taking: Reported on 07/08/2015) 30 mL 2  . azithromycin (ZITHROMAX Z-PAK) 250 MG tablet 2 po day one, then 1 daily x 4 days (Patient not taking: Reported on 07/08/2015) 5 tablet 0  . fexofenadine (ALLEGRA) 180 MG tablet Take 180 mg by mouth daily.    . fluticasone (FLONASE) 50 MCG/ACT nasal spray Place 2 sprays into both nostrils as needed for allergies. 16 g 3  . montelukast (SINGULAIR) 10 MG tablet TAKE ONE TABLET AT BEDTIME. 90 tablet 1  . neomycin-polymyxin-hydrocortisone (CORTISPORIN) otic solution Place 4 drops into the right ear 3 (three) times daily. (Patient not taking: Reported on 07/08/2015) 10 mL 0  . predniSONE (DELTASONE) 20 MG tablet 3 tabs po day one, then 2 tabs daily x 4 days (Patient not taking: Reported on 07/08/2015) 11 tablet 0   No current facility-administered medications on file prior to visit.   Health Maintenance:  Immunization History  Administered Date(s) Administered  . DTaP 08/25/2011  . Influenza Whole 08/29/2013   Tetanus: 2012 Pneumovax: Prevnar 13:  Flu vaccine:2016 Zostavax: DEXA: Colonoscopy: 04/2010 due 10 years with Dr. Earlean Shawl EGD: CXR 2016  Allergies:  Allergies  Allergen Reactions  . Shellfish Allergy Nausea Only  . Iodine    Medical History:  Past Medical History  Diagnosis Date  . Diverticulitis   . Hemorrhoid   . Kidney stones   . Swollen lymph nodes     abd, groin,   . Allergy   . Asthma   . Hyperlipidemia   . Hypertension    Surgical History:  Past Surgical History  Procedure Laterality Date  . Vasectomy  MID 90S   Family History:  Family History  Problem Relation Age of Onset  . Diabetes Father   . Heart disease Father   . Heart failure Father   . Cancer Brother     Melanoma- left arm  . Atrial fibrillation Mother    Social History:   Social History  Substance Use Topics  . Smoking status: Never Smoker   .  Smokeless tobacco: Never Used  . Alcohol Use: 0.0 oz/week    1-2 Cans of beer per week     Comment: 2OZ A WEEK   Review of Systems  Constitutional: Negative.   HENT: Negative.   Respiratory: Negative.   Cardiovascular: Negative.   Gastrointestinal: Negative.   Genitourinary: Negative.   Musculoskeletal: Negative.   Skin: Negative.   Neurological: Negative.   Psychiatric/Behavioral: Negative.     Physical Exam: Estimated body mass index is 35.76 kg/(m^2) as calculated from the following:   Height as of this encounter: 6\' 1"  (1.854 m).   Weight as of this encounter: 271 lb (122.925 kg). BP 140/80 mmHg  Pulse 94  Temp(Src) 97.9 F (36.6 C) (Temporal)  Resp 16  Ht 6\' 1"  (1.854 m)  Wt 271 lb (122.925 kg)  BMI 35.76 kg/m2  SpO2 97% General Appearance: Well nourished, in no apparent distress.  Eyes: PERRLA, EOMs, conjunctiva no swelling or erythema, normal fundi and vessels.  Sinuses:  No Frontal/maxillary tenderness  ENT/Mouth: Ext aud canals clear, normal light reflex with TMs without erythema, bulging. Good dentition. No erythema, swelling, or exudate on post pharynx. Tonsils not swollen or erythematous. Hearing normal.  Neck: Supple, thyroid normal. No bruits  Respiratory: Respiratory effort normal, BS equal bilaterally without rales, rhonchi, wheezing or stridor.  Cardio: RRR without murmurs, rubs or gallops. Brisk peripheral pulses without edema.  Chest: symmetric, with normal excursions and percussion.  Abdomen: Soft, nontender, obese,  no guarding, rebound, hernias, masses, or organomegaly. .  Lymphatics: Non tender without lymphadenopathy.  Genitourinary: defer Musculoskeletal: Full ROM all peripheral extremities,5/5 strength, and normal gait.  Skin: Warm, dry without rashes, lesions, ecchymosis. Neuro: Cranial nerves intact, reflexes equal bilaterally. Normal muscle tone, no cerebellar symptoms. Sensation intact.  Psych: Awake and oriented X 3, normal affect, Insight  and Judgment appropriate.   EKG: WNL no changes.  Vicie Mutters 8:49 AM [facility]

## 2015-09-18 LAB — CBC WITH DIFFERENTIAL/PLATELET
Basophils Absolute: 0 10*3/uL (ref 0.0–0.1)
Basophils Relative: 0 % (ref 0–1)
Eosinophils Absolute: 0.3 10*3/uL (ref 0.0–0.7)
Eosinophils Relative: 4 % (ref 0–5)
HEMATOCRIT: 47.4 % (ref 39.0–52.0)
HEMOGLOBIN: 16.7 g/dL (ref 13.0–17.0)
LYMPHS PCT: 23 % (ref 12–46)
Lymphs Abs: 1.5 10*3/uL (ref 0.7–4.0)
MCH: 30.4 pg (ref 26.0–34.0)
MCHC: 35.2 g/dL (ref 30.0–36.0)
MCV: 86.3 fL (ref 78.0–100.0)
MONO ABS: 0.7 10*3/uL (ref 0.1–1.0)
MONOS PCT: 11 % (ref 3–12)
MPV: 9.3 fL (ref 8.6–12.4)
NEUTROS ABS: 4 10*3/uL (ref 1.7–7.7)
NEUTROS PCT: 62 % (ref 43–77)
Platelets: 287 10*3/uL (ref 150–400)
RBC: 5.49 MIL/uL (ref 4.22–5.81)
RDW: 13.5 % (ref 11.5–15.5)
WBC: 6.4 10*3/uL (ref 4.0–10.5)

## 2015-09-18 LAB — MAGNESIUM: MAGNESIUM: 1.9 mg/dL (ref 1.5–2.5)

## 2015-09-18 LAB — URINALYSIS, ROUTINE W REFLEX MICROSCOPIC
BILIRUBIN URINE: NEGATIVE
GLUCOSE, UA: NEGATIVE
HGB URINE DIPSTICK: NEGATIVE
Ketones, ur: NEGATIVE
Leukocytes, UA: NEGATIVE
Nitrite: NEGATIVE
PROTEIN: NEGATIVE
Specific Gravity, Urine: 1.014 (ref 1.001–1.035)
pH: 5.5 (ref 5.0–8.0)

## 2015-09-18 LAB — BASIC METABOLIC PANEL WITH GFR
BUN: 11 mg/dL (ref 7–25)
CALCIUM: 9.2 mg/dL (ref 8.6–10.3)
CHLORIDE: 103 mmol/L (ref 98–110)
CO2: 27 mmol/L (ref 20–31)
Creat: 0.81 mg/dL (ref 0.70–1.33)
GFR, Est African American: >89 mL/min (ref >=60)
GLUCOSE: 98 mg/dL (ref 65–99)
Potassium: 4.2 mmol/L (ref 3.5–5.3)
SODIUM: 138 mmol/L (ref 135–146)

## 2015-09-18 LAB — TSH: TSH: 1.5 u[IU]/mL (ref 0.350–4.500)

## 2015-09-18 LAB — PSA: PSA: 0.81 ng/mL (ref <=4.00)

## 2015-09-18 LAB — LIPID PANEL
CHOL/HDL RATIO: 4.6 ratio (ref <=5.0)
CHOLESTEROL: 187 mg/dL (ref 125–200)
HDL: 41 mg/dL (ref >=40)
LDL CALC: 133 mg/dL — AB (ref <130)
Triglycerides: 64 mg/dL (ref <150)
VLDL: 13 mg/dL (ref <30)

## 2015-09-18 LAB — MICROALBUMIN / CREATININE URINE RATIO
Creatinine, Urine: 149 mg/dL (ref 20–370)
Microalb Creat Ratio: 3 mcg/mg creat (ref <30)
Microalb, Ur: 0.5 mg/dL

## 2015-09-18 LAB — HEPATIC FUNCTION PANEL
ALK PHOS: 70 U/L (ref 40–115)
ALT: 38 U/L (ref 9–46)
AST: 30 U/L (ref 10–35)
Albumin: 4.2 g/dL (ref 3.6–5.1)
BILIRUBIN DIRECT: 0.2 mg/dL (ref <=0.2)
BILIRUBIN TOTAL: 1 mg/dL (ref 0.2–1.2)
Indirect Bilirubin: 0.8 mg/dL (ref 0.2–1.2)
Total Protein: 6.6 g/dL (ref 6.1–8.1)

## 2015-09-18 LAB — VITAMIN D 25 HYDROXY (VIT D DEFICIENCY, FRACTURES): Vit D, 25-Hydroxy: 27 ng/mL — ABNORMAL LOW (ref 30–100)

## 2015-09-18 LAB — INSULIN, FASTING: Insulin fasting, serum: 10.4 u[IU]/mL (ref 2.0–19.6)

## 2016-02-22 ENCOUNTER — Other Ambulatory Visit: Payer: Self-pay | Admitting: Physician Assistant

## 2016-04-03 ENCOUNTER — Ambulatory Visit: Payer: Self-pay | Admitting: Physician Assistant

## 2016-05-09 ENCOUNTER — Encounter: Payer: Self-pay | Admitting: Physician Assistant

## 2016-05-09 ENCOUNTER — Ambulatory Visit (INDEPENDENT_AMBULATORY_CARE_PROVIDER_SITE_OTHER): Payer: 59 | Admitting: Physician Assistant

## 2016-05-09 VITALS — BP 140/80 | HR 86 | Temp 98.1°F | Resp 14 | Ht 73.0 in | Wt 265.6 lb

## 2016-05-09 DIAGNOSIS — I10 Essential (primary) hypertension: Secondary | ICD-10-CM | POA: Diagnosis not present

## 2016-05-09 DIAGNOSIS — J45909 Unspecified asthma, uncomplicated: Secondary | ICD-10-CM

## 2016-05-09 DIAGNOSIS — Z79899 Other long term (current) drug therapy: Secondary | ICD-10-CM

## 2016-05-09 DIAGNOSIS — E785 Hyperlipidemia, unspecified: Secondary | ICD-10-CM

## 2016-05-09 DIAGNOSIS — E559 Vitamin D deficiency, unspecified: Secondary | ICD-10-CM

## 2016-05-09 LAB — CBC WITH DIFFERENTIAL/PLATELET
BASOS PCT: 0 %
Basophils Absolute: 0 cells/uL (ref 0–200)
EOS ABS: 232 {cells}/uL (ref 15–500)
Eosinophils Relative: 4 %
HCT: 46.6 % (ref 38.5–50.0)
Hemoglobin: 16.2 g/dL (ref 13.2–17.1)
LYMPHS PCT: 22 %
Lymphs Abs: 1276 cells/uL (ref 850–3900)
MCH: 30 pg (ref 27.0–33.0)
MCHC: 34.8 g/dL (ref 32.0–36.0)
MCV: 86.3 fL (ref 80.0–100.0)
MONOS PCT: 12 %
MPV: 9.7 fL (ref 7.5–12.5)
Monocytes Absolute: 696 cells/uL (ref 200–950)
NEUTROS PCT: 62 %
Neutro Abs: 3596 cells/uL (ref 1500–7800)
PLATELETS: 250 10*3/uL (ref 140–400)
RBC: 5.4 MIL/uL (ref 4.20–5.80)
RDW: 14 % (ref 11.0–15.0)
WBC: 5.8 10*3/uL (ref 3.8–10.8)

## 2016-05-09 LAB — BASIC METABOLIC PANEL WITH GFR
BUN: 14 mg/dL (ref 7–25)
CALCIUM: 9.1 mg/dL (ref 8.6–10.3)
CO2: 24 mmol/L (ref 20–31)
Chloride: 105 mmol/L (ref 98–110)
Creat: 0.99 mg/dL (ref 0.70–1.33)
GFR, Est Non African American: 85 mL/min (ref >=60)
GLUCOSE: 98 mg/dL (ref 65–99)
POTASSIUM: 4.5 mmol/L (ref 3.5–5.3)
Sodium: 137 mmol/L (ref 135–146)

## 2016-05-09 LAB — HEPATIC FUNCTION PANEL
ALT: 25 U/L (ref 9–46)
AST: 22 U/L (ref 10–35)
Albumin: 4.3 g/dL (ref 3.6–5.1)
Alkaline Phosphatase: 64 U/L (ref 40–115)
BILIRUBIN TOTAL: 1 mg/dL (ref 0.2–1.2)
Bilirubin, Direct: 0.2 mg/dL (ref <=0.2)
Indirect Bilirubin: 0.8 mg/dL (ref 0.2–1.2)
Total Protein: 6.8 g/dL (ref 6.1–8.1)

## 2016-05-09 LAB — LIPID PANEL
CHOL/HDL RATIO: 4.1 ratio (ref <=5.0)
CHOLESTEROL: 187 mg/dL (ref 125–200)
HDL: 46 mg/dL (ref >=40)
LDL Cholesterol: 131 mg/dL — ABNORMAL HIGH (ref <130)
TRIGLYCERIDES: 50 mg/dL (ref <150)
VLDL: 10 mg/dL (ref <30)

## 2016-05-09 LAB — TSH: TSH: 1.57 mIU/L (ref 0.40–4.50)

## 2016-05-09 NOTE — Progress Notes (Signed)
Assessment and Plan:  Asthma, unspecified asthma severity, uncomplicated Better with singulair, albuterol, but has cleaned out humidifer from house and doing better.   Hyperlipidemia -continue medications, check lipids, decrease fatty foods, increase activity.  - Lipid panel - EKG 12-Lead   Essential hypertension - continue medications, DASH diet, exercise and monitor at home. Call if greater than 130/80.  - CBC with Differential/Platelet - BASIC METABOLIC PANEL WITH GFR - TSH - Hepatic function panel   Obesity Obesity with co morbidities- long discussion about weight loss, diet, and exercise - Find out which nutritionist is in network and i can send referral.    Medication management - Magnesium  Vitamin D deficiency - Vit D  25 hydroxy (rtn osteoporosis monitoring)  . Discussed med's effects and SE's. Screening labs and tests as requested with regular follow-up as recommended. Future Appointments Date Time Provider [place]  09/16/2016 9:00 AM Vicie Mutters, PA-C [id number] None  Want CPE after Jan due to work, will do.    HPI 55 y.o. WM with history of HTN, chol, vitamin D def presents for follow up.   His blood pressure has been controlled at home, today their BP is BP: 140/80 mmHg He does workout, on tread climber. He denies chest pain, shortness of breath, dizziness.  He is not on cholesterol medication and denies myalgias. His cholesterol is at goal. The cholesterol last visit was:   Lab Results  Component Value Date   CHOL 187 09/17/2015   HDL 41 09/17/2015   LDLCALC 133* 09/17/2015   TRIG 64 09/17/2015   CHOLHDL 4.6 09/17/2015   Last A1C in the office was:  Lab Results  Component Value Date   HGBA1C 5.6 09/17/2015   Patient is on Vitamin D supplement.   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 Mom died in January 27, 2015 BMI is Body mass index is 35.05 kg/(m^2)., he is working on diet and exercise, he would like to see a nutritionist, and his work will pay for it.  Wt Readings from Last 3 Encounters:  05/09/16 265 lb 9.6 oz (120.475 kg)  09/17/15 271 lb (122.925 kg)  07/08/15 270 lb (122.471 kg)   He has asthma/allergies and is on singulair , allegra, and has albuterol which helps.   Current Medications:  Current Outpatient Prescriptions on File Prior to Visit  Medication Sig Dispense Refill  . albuterol (PROVENTIL) (2.5 MG/3ML) 0.083% nebulizer solution Take 3 mLs (2.5 mg total) by nebulization every 6 (six) hours as needed for wheezing or shortness of breath. 150 mL 1  . albuterol (VENTOLIN HFA) 108 (90 BASE) MCG/ACT inhaler Inhale 2 puffs into the lungs every 4 (four) hours as needed for wheezing. 1 Inhaler 3  . fexofenadine (ALLEGRA) 180 MG tablet Take 180 mg by mouth daily.    . fluticasone (FLONASE) 50 MCG/ACT nasal spray Place 2 sprays into both nostrils as needed for allergies. 16 g 3  . montelukast (SINGULAIR) 10 MG tablet TAKE ONE TABLET AT BEDTIME. 90 tablet 1   No current facility-administered medications on file prior to visit.   Review of Systems  Constitutional: Negative.   HENT: Negative.   Respiratory: Negative.   Cardiovascular: Negative.   Gastrointestinal: Negative.   Genitourinary: Negative.   Musculoskeletal: Negative.   Skin: Negative.  Neurological: Negative.   Psychiatric/Behavioral: Negative.     Physical Exam: Estimated body mass index is 35.05 kg/(m^2) as calculated from the following:   Height as of this encounter: 6\' 1"  (1.854 m).   Weight as of this encounter: 265 lb 9.6 oz (120.475 kg). BP 140/80 mmHg  Pulse 86  Temp(Src) 98.1 F (36.7 C) (Temporal)  Resp 14  Ht 6\' 1"  (1.854 m)  Wt 265 lb 9.6 oz (120.475 kg)  BMI 35.05 kg/m2  SpO2 97% General Appearance: Well nourished, in no apparent distress.  Eyes: PERRLA, EOMs,  conjunctiva no swelling or erythema, normal fundi and vessels.  Sinuses: No Frontal/maxillary tenderness  ENT/Mouth: Ext aud canals clear, normal light reflex with TMs without erythema, bulging. Good dentition. No erythema, swelling, or exudate on post pharynx. Tonsils not swollen or erythematous. Hearing normal.  Neck: Supple, thyroid normal. No bruits  Respiratory: Respiratory effort normal, BS equal bilaterally without rales, rhonchi, wheezing or stridor.  Cardio: RRR without murmurs, rubs or gallops. Brisk peripheral pulses without edema.  Chest: symmetric, with normal excursions and percussion.  Abdomen: Soft, nontender, obese,  no guarding, rebound, hernias, masses, or organomegaly. .  Lymphatics: Non tender without lymphadenopathy.  Musculoskeletal: Full ROM all peripheral extremities,5/5 strength, and normal gait.  Skin: Warm, dry without rashes, lesions, ecchymosis. Neuro: Cranial nerves intact, reflexes equal bilaterally. Normal muscle tone, no cerebellar symptoms. Sensation intact.  Psych: Awake and oriented X 3, normal affect, Insight and Judgment appropriate.    Vicie Mutters 10:07 AM [facility]

## 2016-05-09 NOTE — Patient Instructions (Signed)
  Vitamin D goal is between 60-80  Please make sure that you are taking your Vitamin D as directed.   It is very important as a natural anti-inflammatory   helping hair, skin, and nails, as well as reducing stroke and heart attack risk.   It helps your bones and helps with mood.  It also decreases numerous cancer risks so please take it as directed.   Low Vit D is associated with a 200-300% higher risk for CANCER   and 200-300% higher risk for HEART   ATTACK  &  STROKE.    .....................................[name] Kitchen  It is also associated with higher death rate at younger ages,   autoimmune diseases like Rheumatoid arthritis, Lupus, Multiple Sclerosis.     Also many other serious conditions, like depression, Alzheimer's  Dementia, infertility, muscle aches, fatigue, fibromyalgia - just to name a few.  +++++++++++++++++++  Can get liquid vitamin D from [place] here in [place] at  [facility] alternatives [address], [place], [place] [address]

## 2016-06-17 ENCOUNTER — Encounter: Payer: Self-pay | Admitting: Internal Medicine

## 2016-06-17 ENCOUNTER — Ambulatory Visit (INDEPENDENT_AMBULATORY_CARE_PROVIDER_SITE_OTHER): Payer: 59 | Admitting: Internal Medicine

## 2016-06-17 VITALS — BP 122/78 | HR 88 | Temp 98.0°F | Resp 18 | Ht 73.0 in | Wt 263.0 lb

## 2016-06-17 DIAGNOSIS — J4521 Mild intermittent asthma with (acute) exacerbation: Secondary | ICD-10-CM

## 2016-06-17 DIAGNOSIS — J302 Other seasonal allergic rhinitis: Secondary | ICD-10-CM | POA: Diagnosis not present

## 2016-06-17 DIAGNOSIS — J45901 Unspecified asthma with (acute) exacerbation: Secondary | ICD-10-CM | POA: Diagnosis not present

## 2016-06-17 MED ORDER — FLUTICASONE PROPIONATE 50 MCG/ACT NA SUSP: 2.0000 | NASAL | 3 refills | Status: AC | PRN

## 2016-06-17 MED ORDER — MOMETASONE FURO-FORMOTEROL FUM 100-5 MCG/ACT IN AERO: 2.0000 | INHALATION_SPRAY | Freq: Two times a day (BID) | RESPIRATORY_TRACT | 0 refills | Status: DC

## 2016-06-17 MED ORDER — ALBUTEROL SULFATE HFA 108 (90 BASE) MCG/ACT IN AERS
2.0000 | INHALATION_SPRAY | Freq: Four times a day (QID) | RESPIRATORY_TRACT | 3 refills | Status: DC | PRN
Start: 2016-06-17 — End: 2017-01-13

## 2016-06-17 MED ORDER — IPRATROPIUM-ALBUTEROL 0.5-2.5 (3) MG/3ML IN SOLN
3.0000 mL | Freq: Once | RESPIRATORY_TRACT | Status: AC
  Administered 2016-06-17: 3 mL via RESPIRATORY_TRACT

## 2016-06-17 MED ORDER — DEXAMETHASONE SODIUM PHOSPHATE 100 MG/10ML IJ SOLN
10.0000 mg | Freq: Once | INTRAMUSCULAR | Status: AC
  Administered 2016-06-17: 10 mg via INTRAMUSCULAR

## 2016-06-17 NOTE — Progress Notes (Signed)
HPI  Patient presents to the office for evaluation of cough and post nasal drainage.  It has been going on for 1 weeks.  Patient reports all the time cough with minimal wheezing.  He is not using an albuterol inhaler currently.    They also endorse change in voice, postnasal drip, shortness of breath and sore throat, headache, sinus pressure at the beginning.  Green nasal discharge, same sputum being coughed up.  .  They have tried singulair and allegra.  He is out of albuterol.  He is not using flonase currently.  They report that nothing has worked.  They denies other sick contacts.  Review of Systems  Constitutional: Positive for malaise/fatigue. Negative for chills and fever.  HENT: Positive for congestion, ear pain, hearing loss and sore throat.   Respiratory: Positive for cough. Negative for sputum production, shortness of breath and wheezing.   Cardiovascular: Negative for chest pain, palpitations and leg swelling.  Neurological: Positive for headaches.    PE:  Vitals:   06/17/16 0941  BP: 122/78  Pulse: 88  Resp: 18  Temp: 98 F (36.7 C)   General:  Alert and non-toxic, WDWN, NAD HEENT: NCAT, PERLA, EOM normal, no occular discharge or erythema.  Nasal mucosal edema with sinus tenderness to palpation.  Oropharynx clear with minimal oropharyngeal edema and erythema.  Mucous membranes moist and pink. Neck:  Cervical adenopathy Chest:  RRR no MRGs.  Lungs clear to auscultation A&P with no wheezes rhonchi or rales.   Abdomen: +BS x 4 quadrants, soft, non-tender, no guarding, rigidity, or rebound. Skin: warm and dry no rash Neuro: A&Ox4, CN II-XII grossly intact  Assessment and Plan:  1. Asthma with acute exacerbation, unspecified asthma severity  - ipratropium-albuterol (DUONEB) 0.5-2.5 (3) MG/3ML nebulizer solution 3 mL; Take 3 mLs by nebulization once. - dexamethasone (DECADRON) injection 10 mg; Inject 1 mL (10 mg total) into the muscle once. -albuterol inhaler for  home -dulera sample given  -cont allergy medications -offered tessalon which patient declined -patient to return for worsening symptoms.

## 2016-06-26 ENCOUNTER — Telehealth: Payer: Self-pay | Admitting: Internal Medicine

## 2016-06-26 MED ORDER — AZITHROMYCIN 250 MG PO TABS: ORAL_TABLET | ORAL | 0 refills | Status: DC

## 2016-06-26 MED ORDER — PREDNISONE 20 MG PO TABS
ORAL_TABLET | ORAL | 0 refills | Status: DC
Start: 2016-06-26 — End: 2017-01-13

## 2016-06-26 NOTE — Telephone Encounter (Signed)
Patient calls reporting that he has been having thicker green mucous and has not been feeling any better.  He was recently here for acute asthma exacerbation.  Will send in both prednisone and also a zpak.

## 2016-09-10 ENCOUNTER — Other Ambulatory Visit: Payer: Self-pay | Admitting: Internal Medicine

## 2016-09-16 ENCOUNTER — Encounter: Payer: Self-pay | Admitting: Physician Assistant

## 2016-11-17 ENCOUNTER — Encounter: Payer: Self-pay | Admitting: Physician Assistant

## 2016-12-18 ENCOUNTER — Other Ambulatory Visit: Payer: Self-pay | Admitting: Internal Medicine

## 2017-01-13 ENCOUNTER — Ambulatory Visit (INDEPENDENT_AMBULATORY_CARE_PROVIDER_SITE_OTHER): Payer: 59 | Admitting: Physician Assistant

## 2017-01-13 ENCOUNTER — Encounter: Payer: Self-pay | Admitting: Physician Assistant

## 2017-01-13 VITALS — BP 134/88 | HR 67 | Temp 97.7°F | Resp 14 | Ht 73.0 in | Wt 259.0 lb

## 2017-01-13 DIAGNOSIS — Z87442 Personal history of urinary calculi: Secondary | ICD-10-CM

## 2017-01-13 DIAGNOSIS — D649 Anemia, unspecified: Secondary | ICD-10-CM

## 2017-01-13 DIAGNOSIS — Z136 Encounter for screening for cardiovascular disorders: Secondary | ICD-10-CM | POA: Diagnosis not present

## 2017-01-13 DIAGNOSIS — Z125 Encounter for screening for malignant neoplasm of prostate: Secondary | ICD-10-CM

## 2017-01-13 DIAGNOSIS — Z0001 Encounter for general adult medical examination with abnormal findings: Secondary | ICD-10-CM

## 2017-01-13 DIAGNOSIS — Z0000 Encounter for general adult medical examination without abnormal findings: Secondary | ICD-10-CM | POA: Diagnosis not present

## 2017-01-13 DIAGNOSIS — I10 Essential (primary) hypertension: Secondary | ICD-10-CM

## 2017-01-13 DIAGNOSIS — G4733 Obstructive sleep apnea (adult) (pediatric): Secondary | ICD-10-CM

## 2017-01-13 DIAGNOSIS — J302 Other seasonal allergic rhinitis: Secondary | ICD-10-CM

## 2017-01-13 DIAGNOSIS — J45909 Unspecified asthma, uncomplicated: Secondary | ICD-10-CM

## 2017-01-13 DIAGNOSIS — K5792 Diverticulitis of intestine, part unspecified, without perforation or abscess without bleeding: Secondary | ICD-10-CM

## 2017-01-13 DIAGNOSIS — E559 Vitamin D deficiency, unspecified: Secondary | ICD-10-CM

## 2017-01-13 DIAGNOSIS — Z79899 Other long term (current) drug therapy: Secondary | ICD-10-CM

## 2017-01-13 DIAGNOSIS — E785 Hyperlipidemia, unspecified: Secondary | ICD-10-CM

## 2017-01-13 LAB — CBC WITH DIFFERENTIAL/PLATELET
BASOS ABS: 0 {cells}/uL (ref 0–200)
Basophils Relative: 0 %
EOS ABS: 240 {cells}/uL (ref 15–500)
EOS PCT: 3 %
HCT: 46.1 % (ref 38.5–50.0)
HEMOGLOBIN: 15.8 g/dL (ref 13.2–17.1)
LYMPHS ABS: 1680 {cells}/uL (ref 850–3900)
Lymphocytes Relative: 21 %
MCH: 29.4 pg (ref 27.0–33.0)
MCHC: 34.3 g/dL (ref 32.0–36.0)
MCV: 85.7 fL (ref 80.0–100.0)
MPV: 9.3 fL (ref 7.5–12.5)
Monocytes Absolute: 720 cells/uL (ref 200–950)
Monocytes Relative: 9 %
NEUTROS ABS: 5360 {cells}/uL (ref 1500–7800)
Neutrophils Relative %: 67 %
Platelets: 254 10*3/uL (ref 140–400)
RBC: 5.38 MIL/uL (ref 4.20–5.80)
RDW: 14.4 % (ref 11.0–15.0)
WBC: 8 10*3/uL (ref 3.8–10.8)

## 2017-01-13 LAB — LIPID PANEL
CHOL/HDL RATIO: 4.5 ratio (ref <5.0)
Cholesterol: 211 mg/dL — ABNORMAL HIGH (ref <200)
HDL: 47 mg/dL (ref >40)
LDL CALC: 146 mg/dL — AB (ref <100)
Triglycerides: 89 mg/dL (ref <150)
VLDL: 18 mg/dL (ref <30)

## 2017-01-13 LAB — BASIC METABOLIC PANEL WITH GFR
BUN: 14 mg/dL (ref 7–25)
CHLORIDE: 103 mmol/L (ref 98–110)
CO2: 23 mmol/L (ref 20–31)
CREATININE: 1.06 mg/dL (ref 0.70–1.33)
Calcium: 9.3 mg/dL (ref 8.6–10.3)
GFR, Est African American: >89 mL/min (ref >=60)
GFR, Est Non African American: 78 mL/min (ref >=60)
Glucose, Bld: 86 mg/dL (ref 65–99)
POTASSIUM: 4 mmol/L (ref 3.5–5.3)
SODIUM: 137 mmol/L (ref 135–146)

## 2017-01-13 LAB — HEPATIC FUNCTION PANEL
ALK PHOS: 70 U/L (ref 40–115)
ALT: 21 U/L (ref 9–46)
AST: 26 U/L (ref 10–35)
Albumin: 4.1 g/dL (ref 3.6–5.1)
BILIRUBIN DIRECT: 0.2 mg/dL (ref <=0.2)
BILIRUBIN INDIRECT: 1.1 mg/dL (ref 0.2–1.2)
Total Bilirubin: 1.3 mg/dL — ABNORMAL HIGH (ref 0.2–1.2)
Total Protein: 6.7 g/dL (ref 6.1–8.1)

## 2017-01-13 LAB — IRON AND TIBC
%SAT: 55 % (ref 15–60)
Iron: 174 ug/dL (ref 50–180)
TIBC: 315 ug/dL (ref 250–425)
UIBC: 141 ug/dL (ref 125–400)

## 2017-01-13 LAB — TSH: TSH: 1.4 m[IU]/L (ref 0.40–4.50)

## 2017-01-13 LAB — MAGNESIUM: MAGNESIUM: 1.8 mg/dL (ref 1.5–2.5)

## 2017-01-13 MED ORDER — MONTELUKAST SODIUM 10 MG PO TABS: 10.0000 mg | ORAL_TABLET | Freq: Every day | ORAL | 2 refills | Status: DC

## 2017-01-13 NOTE — Patient Instructions (Signed)
Simple math prevails.    1st - exercise does not produce significant weight loss - at best one converts fat into muscle , "bulks up", loses inches, but usually stays "weight neutral"     2nd - think of your body weightas a check book: If you eat more calories than you burn up - you save money or gain weight .... Or if you spend more money than you put in the check book, ie burn up more calories than you eat, then you lose weight     3rd - if you walk or run 1 mile, you burn up 100 calories - you have to burn up 3,500 calories to lose 1 pound, ie you have to walk/run 35 miles to lose 1 measly pound. So if you want to lose 10 #, then you have to walk/run 350 miles, so.... clearly exercise is not the solution.     4. So if you consume 1,500 calories, then you have to burn up the equivalent of 15 miles to stay weight neutral - It also stands to reason that if you consume 1,500 cal/day and don't lose weight, then you must be burning up about 1,500 cals/day to stay weight neutral.     5. If you really want to lose weight, you must cut your calorie intake 300 calories /day and at that rate you should lose about 1 # every 3 days.   6. Please purchase Dr Joel Fuhrman's book(s) "The End of Dieting" & "Eat to Live" . It has some great concepts and recipes.      

## 2017-01-13 NOTE — Progress Notes (Signed)
Complete Physical  Assessment and Plan: Asthma, unspecified asthma severity, uncomplicated Better with singulair, albuterol, but has cleaned out humidifer from house and doing better.   Hyperlipidemia -continue medications, check lipids, decrease fatty foods, increase activity.  - Lipid panel - EKG 12-Lead  Obstructive sleep apnea Weight loss advised  Seasonal allergies Continue medications  Encounter for general adult medical examination with abnormal findings   Essential hypertension - continue medications, DASH diet, exercise and monitor at home. Call if greater than 130/80.  - CBC with Differential/Platelet - BASIC METABOLIC PANEL WITH GFR - TSH - Hepatic function panel - EKG 12-Lead  Morbid Obesity Obesity with co morbidities- long discussion about weight loss, diet, and exercise   History of nephrolithiasis Increase fluids   Diverticulitis of intestine without perforation or abscess without bleeding Continue fiber  Prediabetes Discussed general issues about diabetes pathophysiology and management., Educational material distributed., Suggested low cholesterol diet., Encouraged aerobic exercise., Discussed foot care., Reminded to get yearly retinal exam. - Hemoglobin A1c - Insulin, fasting   Medication management - Magnesium  Vitamin D deficiency - Vit D  25 hydroxy (rtn osteoporosis monitoring)  Screening for prostate cancer - PSA  . Discussed med's effects and SE's. Screening labs and tests as requested with regular follow-up as recommended.  HPI Patient presents for a complete physical.    His blood pressure has been controlled at home, today their BP is BP: 134/88 He does workout, on tread climber. He denies chest pain, shortness of breath, dizziness.  He is not on cholesterol medication and denies myalgias. His cholesterol is at goal. The cholesterol last visit was:   Lab Results  Component Value Date   CHOL 187 05/09/2016   HDL 46 05/09/2016   LDLCALC 131 (H) 05/09/2016   TRIG 50 05/09/2016   CHOLHDL 4.1 05/09/2016   Last A1C in the office was:  Lab Results  Component Value Date   HGBA1C 5.6 09/17/2015   Patient is on Vitamin D supplement.   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  Mom passed 2017 BMI is Body mass index is 34.17 kg/m., he is working on diet and exercise, now that he is retired he is increasing exercise, increasing water, and going to decrease portions. Will use lose it.   Wt Readings from Last 3 Encounters:  01/13/17 259 lb (117.5 kg)  06/17/16 263 lb (119.3 kg)  05/09/16 265 lb 9.6 oz (120.5 kg)   He has asthma/allergies and is on singulair , allegra, and has albuterol which helps.   Current Medications:  Current Outpatient Prescriptions on File Prior to Visit  Medication Sig Dispense Refill  . fexofenadine (ALLEGRA) 180 MG tablet Take 180 mg by mouth daily.    . fluticasone (FLONASE) 50 MCG/ACT nasal spray Place 2 sprays into both nostrils as needed for allergies. 16 g 3  . montelukast (SINGULAIR) 10 MG tablet TAKE ONE TABLET AT BEDTIME. 90 tablet 21   No current facility-administered medications on file prior to visit.    Health Maintenance:  Immunization History  Administered Date(s) Administered  . DTaP 08/25/2011  . Influenza Whole 08/29/2013   Tetanus: 2012 Pneumovax: N/A Prevnar 13: N/A Flu vaccine:2017 Zostavax: N/A  DEXA: N/A Colonoscopy: 08/2016 due 10 years EGD: N/A CXR 2016  Medical History:  Past Medical History:  Diagnosis Date  . Allergy   . Asthma   . Diverticulitis   . Hemorrhoid   . Hyperlipidemia   . Hypertension   . Kidney stones   . Swollen lymph nodes    abd, groin,    Allergies Allergies  Allergen Reactions  .  Shellfish Allergy Nausea Only  . Iodine     SURGICAL HISTORY He  has a past surgical history that includes Vasectomy (MID 90S). FAMILY HISTORY His family history includes Atrial fibrillation in his mother; Cancer in his brother; Diabetes in his father; Heart disease in his father; Heart failure in his father. SOCIAL HISTORY He  reports that he has never smoked. He has never used smokeless tobacco. He reports that he drinks alcohol. He reports that he does not use drugs.  Review of Systems  Constitutional: Negative.   HENT: Negative.   Respiratory: Negative.   Cardiovascular: Negative.   Gastrointestinal: Negative.   Genitourinary: Negative.   Musculoskeletal: Negative.   Skin: Negative.   Neurological: Negative.   Psychiatric/Behavioral: Negative.     Physical Exam: Estimated body mass index is 34.17 kg/m as calculated from the following:   Height as of this encounter: 6\' 1"  (1.854 m).   Weight as of this encounter: 259 lb (117.5 kg). BP 134/88   Pulse 67   Temp 97.7 F (36.5 C)   Resp 14   Ht 6\' 1"  (1.854 m)   Wt 259 lb (117.5 kg)   SpO2 99%   BMI 34.17 kg/m  General Appearance: Well nourished, in no apparent distress.  Eyes: PERRLA, EOMs, conjunctiva no swelling or erythema, normal fundi and vessels.  Sinuses: No Frontal/maxillary tenderness  ENT/Mouth: Ext aud canals clear, normal light reflex with TMs without erythema, bulging. Good dentition. No erythema, swelling, or exudate on post pharynx. Tonsils not swollen or erythematous. Hearing normal.  Neck: Supple, thyroid normal. No bruits  Respiratory: Respiratory effort normal, BS equal bilaterally without rales, rhonchi, wheezing or stridor.  Cardio: RRR without murmurs, rubs or gallops. Brisk peripheral pulses without edema.  Chest: symmetric, with normal excursions and percussion.  Abdomen: Soft, nontender, obese,  no guarding, rebound, hernias, masses, or organomegaly.  Lymphatics: Non tender without  lymphadenopathy.  Genitourinary: defer Musculoskeletal: Full ROM all peripheral extremities,5/5 strength, and normal gait.  Skin: Warm, dry without rashes, lesions, ecchymosis. Neuro: Cranial nerves intact, reflexes equal bilaterally. Normal muscle tone, no cerebellar symptoms. Sensation intact.  Psych: Awake and oriented X 3, normal affect, Insight and Judgment appropriate.   EKG: WNL no changes.  Vicie Mutters 2:18 PM [facility]

## 2017-01-14 LAB — URINALYSIS, ROUTINE W REFLEX MICROSCOPIC
BILIRUBIN URINE: NEGATIVE
GLUCOSE, UA: NEGATIVE
Hgb urine dipstick: NEGATIVE
KETONES UR: NEGATIVE
Leukocytes, UA: NEGATIVE
Nitrite: NEGATIVE
Protein, ur: NEGATIVE
SPECIFIC GRAVITY, URINE: 1.027 (ref 1.001–1.035)
pH: 5.5 (ref 5.0–8.0)

## 2017-01-14 LAB — FERRITIN: FERRITIN: 306 ng/mL (ref 20–380)

## 2017-01-14 LAB — MICROALBUMIN / CREATININE URINE RATIO
Creatinine, Urine: 256 mg/dL (ref 20–370)
MICROALB UR: 0.7 mg/dL
Microalb Creat Ratio: 3 mcg/mg creat (ref <30)

## 2017-01-14 LAB — TESTOSTERONE: Testosterone: 721 ng/dL (ref 250–827)

## 2017-01-14 LAB — PSA: PSA: 1.1 ng/mL (ref <=4.0)

## 2017-01-14 LAB — VITAMIN D 25 HYDROXY (VIT D DEFICIENCY, FRACTURES): VIT D 25 HYDROXY: 29 ng/mL — AB (ref 30–100)

## 2017-01-14 NOTE — Progress Notes (Signed)
Pt aware of lab results & voiced understanding of those results.

## 2017-05-20 DIAGNOSIS — K5790 Diverticulosis of intestine, part unspecified, without perforation or abscess without bleeding: Secondary | ICD-10-CM | POA: Insufficient documentation

## 2017-05-20 DIAGNOSIS — K602 Anal fissure, unspecified: Secondary | ICD-10-CM | POA: Diagnosis not present

## 2017-07-19 ENCOUNTER — Encounter (HOSPITAL_COMMUNITY): Payer: Self-pay | Admitting: Emergency Medicine

## 2017-07-19 ENCOUNTER — Emergency Department (HOSPITAL_COMMUNITY)
Admission: EM | Admit: 2017-07-19 | Discharge: 2017-07-19 | Disposition: A | Discharge status: Home / Self Care | Payer: 59 | Attending: Emergency Medicine | Admitting: Emergency Medicine

## 2017-07-19 ENCOUNTER — Emergency Department (HOSPITAL_COMMUNITY): Payer: 59

## 2017-07-19 DIAGNOSIS — R002 Palpitations: Secondary | ICD-10-CM | POA: Diagnosis not present

## 2017-07-19 DIAGNOSIS — J45909 Unspecified asthma, uncomplicated: Secondary | ICD-10-CM | POA: Diagnosis not present

## 2017-07-19 DIAGNOSIS — Z79899 Other long term (current) drug therapy: Secondary | ICD-10-CM | POA: Insufficient documentation

## 2017-07-19 DIAGNOSIS — I493 Ventricular premature depolarization: Secondary | ICD-10-CM | POA: Diagnosis not present

## 2017-07-19 DIAGNOSIS — I10 Essential (primary) hypertension: Secondary | ICD-10-CM | POA: Diagnosis not present

## 2017-07-19 DIAGNOSIS — E785 Hyperlipidemia, unspecified: Secondary | ICD-10-CM | POA: Insufficient documentation

## 2017-07-19 LAB — BASIC METABOLIC PANEL
Anion gap: 10 (ref 5–15)
BUN: 7 mg/dL (ref 6–20)
CALCIUM: 9.1 mg/dL (ref 8.9–10.3)
CHLORIDE: 108 mmol/L (ref 101–111)
CO2: 23 mmol/L (ref 22–32)
CREATININE: 0.93 mg/dL (ref 0.61–1.24)
GFR calc Af Amer: >60 mL/min (ref >60)
GFR calc non Af Amer: >60 mL/min (ref >60)
GLUCOSE: 116 mg/dL — AB (ref 65–99)
Potassium: 4.7 mmol/L (ref 3.5–5.1)
Sodium: 141 mmol/L (ref 135–145)

## 2017-07-19 LAB — CBC
HCT: 46.4 % (ref 39.0–52.0)
Hemoglobin: 15.9 g/dL (ref 13.0–17.0)
MCH: 28.8 pg (ref 26.0–34.0)
MCHC: 34.3 g/dL (ref 30.0–36.0)
MCV: 84.1 fL (ref 78.0–100.0)
PLATELETS: 257 10*3/uL (ref 150–400)
RBC: 5.52 MIL/uL (ref 4.22–5.81)
RDW: 12.9 % (ref 11.5–15.5)
WBC: 5.4 10*3/uL (ref 4.0–10.5)

## 2017-07-19 LAB — I-STAT TROPONIN, ED: Troponin i, poc: 0 ng/mL (ref 0.00–0.08)

## 2017-07-19 NOTE — ED Triage Notes (Signed)
Pt feeling palpitations since last Thursday and states he will like to be check for this, denies any cp or SOB.

## 2017-07-19 NOTE — Discharge Instructions (Signed)
Return for rapid heart rate lasting more than 20 minutes. Return for any new or worse symptoms. Make an appointment to follow-up with cardiology.

## 2017-07-19 NOTE — ED Provider Notes (Addendum)
[facility] DEPT Provider Note   CSN: [id number] Arrival date & time: 07/19/17  4401     History   Chief Complaint Chief Complaint  Patient presents with  . Palpitations    irregular HR    HPI David Fuller is a 56 y.o. male.  Patient with feeling of palpitations over the past 2 weeks. States occasionally heart rate got rapid but doesn't think it ever went over 100. No chest pain no shortness of breath not feeling like he was not pass out.      Past Medical History:  Diagnosis Date  . Allergy   . Asthma   . Diverticulitis   . Hemorrhoid   . Hyperlipidemia   . Hypertension   . Kidney stones   . Swollen lymph nodes    abd, groin,     Patient Active Problem List   Diagnosis Date Noted  . Essential hypertension 09/17/2015  . Morbid obesity ([place]) 09/17/2015  . History of nephrolithiasis 09/17/2015  . Diverticulitis 09/17/2015  . Hyperlipidemia 08/24/2013  . Seasonal allergies 08/24/2013  . Asthma 08/24/2013  . Obstructive sleep apnea 10/16/2010    Past Surgical History:  Procedure Laterality Date  . VASECTOMY  MID 90S       Home Medications    Prior to Admission medications   Medication Sig Start Date End Date Taking? Authorizing Provider  fexofenadine (ALLEGRA) 180 MG tablet Take 180 mg by mouth daily.    [provider]  fluticasone (FLONASE) 50 MCG/ACT nasal spray Place 2 sprays into both nostrils as needed for allergies. 06/17/16   Forcucci, Courtney, PA-C  montelukast (SINGULAIR) 10 MG tablet Take 1 tablet (10 mg total) by mouth at bedtime. 01/13/17   Vicie Mutters, PA-C    Family History Family History  Problem Relation Age of Onset  . Diabetes Father   . Heart disease Father   . Heart failure Father   . Cancer Brother        Melanoma- left arm  . Atrial fibrillation Mother     Social History Social History  Substance Use Topics  . Smoking status: Never Smoker  . Smokeless tobacco: Never Used  . Alcohol use 0.0  oz/week    1 - 2 Cans of beer per week     Comment: 2OZ A WEEK     Allergies   Shellfish allergy and Iodine   Review of Systems Review of Systems  Constitutional: Negative for fever.  HENT: Negative for congestion.   Eyes: Negative for visual disturbance.  Respiratory: Negative for shortness of breath.   Cardiovascular: Positive for palpitations. Negative for chest pain and leg swelling.  Gastrointestinal: Negative for abdominal pain.  Musculoskeletal: Negative for myalgias.  Skin: Negative for rash.  Neurological: Negative for syncope and light-headedness.  Hematological: Does not bruise/bleed easily.  Psychiatric/Behavioral: Negative for confusion.     Physical Exam Updated Vital Signs BP (!) 147/78 (BP Location: Right Arm)   Pulse 95   Temp 97.8 F (36.6 C) (Oral)   Resp 18   Ht 1.854 m (6\' 1" )   Wt 117.5 kg (259 lb)   SpO2 100%   BMI 34.17 kg/m   Physical Exam  Constitutional: He is oriented to person, place, and time. He appears well-developed and well-nourished. No distress.  HENT:  Head: Normocephalic and atraumatic.  Mouth/Throat: Oropharynx is clear and moist.  Eyes: Pupils are equal, round, and reactive to light. Conjunctivae and EOM are normal.  Neck: Normal range of motion. Neck supple.  Cardiovascular: Normal rate and regular rhythm.   Pulmonary/Chest: Effort normal and breath sounds normal.  Abdominal: Soft. Bowel sounds are normal. There is no tenderness.  Musculoskeletal: Normal range of motion. He exhibits no edema.  Neurological: He is alert and oriented to person, place, and time. No cranial nerve deficit or sensory deficit. He exhibits normal muscle tone.  Skin: Skin is warm.  Nursing note and vitals reviewed.    ED Treatments / Results  Labs (all labs ordered are listed, but only abnormal results are displayed) Labs Reviewed  BASIC METABOLIC PANEL - Abnormal; Notable for the following:       Result Value   Glucose, Bld 116 (*)    All  other components within normal limits  CBC  I-STAT TROPONIN, ED    EKG  EKG Interpretation  Date/Time:  Sunday July 19 2017 06:21:39 EDT Ventricular Rate:  93 PR Interval:  192 QRS Duration: 96 QT Interval:  362 QTC Calculation: 450 R Axis:   112 Text Interpretation:  Sinus rhythm with occasional Premature ventricular complexes Right axis deviation Incomplete right bundle branch block Abnormal ECG No previous ECGs available Confirmed by Fredia Sorrow [phone]) on 07/19/2017 7:29:28 AM       Radiology Dg Chest 2 View  Result Date: 07/19/2017 CLINICAL DATA:  Palpitations since last Thursday. History of hypertension and asthma. EXAM: CHEST  2 VIEW COMPARISON:  07/08/2015 FINDINGS: The heart size and mediastinal contours are within normal limits. Both lungs are clear. The visualized skeletal structures are unremarkable. IMPRESSION: No active cardiopulmonary disease. Electronically Signed   By: Lucienne Capers M.D.   On: 07/19/2017 06:39    Procedures Procedures (including critical care time)  Medications Ordered in ED Medications - No data to display   Initial Impression / Assessment and Plan / ED Course  I have reviewed the triage vital signs and the nursing notes.  Pertinent labs & imaging results that were available during my care of the patient were reviewed by me and considered in my medical decision making (see chart for details).     The patient presents with concerns for palpitations. Denies chest pain or shortness of breath. Patient's EKG shows a single premature ventricular contraction. Heart monitor shows occasional PVCs but rare. Chest x-ray negative troponin negative no evidence of anemia.  Electrolytes are still pending. If no significant abnormalities would recommend follow-up with cardiology will give referral. Cardiac monitoring may be appropriate.  Electrolytes without any significant abnormalities.   Final Clinical Impressions(s) / ED Diagnoses    Final diagnoses:  Heart palpitations  PVC (premature ventricular contraction)    New Prescriptions New Prescriptions   No medications on file     Fredia Sorrow, MD 07/19/17 [place], [place], MD 07/19/17 [phone]

## 2017-07-26 DIAGNOSIS — R0602 Shortness of breath: Secondary | ICD-10-CM | POA: Diagnosis not present

## 2017-07-26 DIAGNOSIS — R062 Wheezing: Secondary | ICD-10-CM | POA: Diagnosis not present

## 2017-07-26 DIAGNOSIS — J209 Acute bronchitis, unspecified: Secondary | ICD-10-CM | POA: Diagnosis not present

## 2017-07-27 NOTE — Progress Notes (Deleted)
Assessment and Plan:  Asthma, unspecified asthma severity, uncomplicated Better with singulair, albuterol, but has cleaned out humidifer from house and doing better.   Hyperlipidemia -continue medications, check lipids, decrease fatty foods, increase activity.  - Lipid panel - EKG 12-Lead   Essential hypertension - continue medications, DASH diet, exercise and monitor at home. Call if greater than 130/80.  - CBC with Differential/Platelet - BASIC METABOLIC PANEL WITH GFR - TSH - Hepatic function panel   Obesity Obesity with co morbidities- long discussion about weight loss, diet, and exercise - Find out which nutritionist is in network and i can send referral.    Medication management - Magnesium  Vitamin D deficiency - Vit D  25 hydroxy (rtn osteoporosis monitoring)  . Discussed med's effects and SE's. Screening labs and tests as requested with regular follow-up as recommended. Future Appointments Date Time Provider [place]  07/28/2017 3:30 PM Vicie Mutters, PA-C [facility] None  09/04/2017 8:15 AM Josue Hector, MD [facility]  01/14/2018 2:00 PM Vicie Mutters, PA-C [facility] None      HPI 56 y.o. WM with history of HTN, chol, vitamin D def presents for follow up.   His blood pressure has been controlled at home, today their BP is   He does workout, on tread climber. He denies chest pain, shortness of breath, dizziness.  He is not on cholesterol medication and denies myalgias. His cholesterol is at goal. The cholesterol last visit was:   Lab Results  Component Value Date   CHOL 211 (H) 01/13/2017   HDL 47 01/13/2017   LDLCALC 146 (H) 01/13/2017   TRIG 89 01/13/2017   CHOLHDL 4.5 01/13/2017   Last A1C in the office was:  Lab Results  Component Value Date   HGBA1C 5.6 09/17/2015   Patient is on Vitamin D supplement.   Lab Results  Component Value Date   VD25OH 29 (L) 01/13/2017     BMI is There is no height or weight on file to  calculate BMI., he is working on diet and exercise, he would like to see a nutritionist, and his work will pay for it.  Wt Readings from Last 3 Encounters:  07/19/17 259 lb (117.5 kg)  01/13/17 259 lb (117.5 kg)  06/17/16 263 lb (119.3 kg)   He has asthma/allergies and is on singulair , allegra, and has albuterol which helps.   Current Medications:  Current Outpatient Prescriptions on File Prior to Visit  Medication Sig Dispense Refill  . albuterol (PROVENTIL HFA;VENTOLIN HFA) 108 (90 Base) MCG/ACT inhaler Inhale 1 puff into the lungs every 6 (six) hours as needed for wheezing or shortness of breath.    . Cholecalciferol (VITAMIN D3 PO) Take 1 capsule by mouth daily.    . fluticasone (FLONASE) 50 MCG/ACT nasal spray Place 2 sprays into both nostrils as needed for allergies. (Patient not taking: Reported on 07/19/2017) 16 g 3  . loratadine (CLARITIN) 10 MG tablet Take 10 mg by mouth at bedtime.    . montelukast (SINGULAIR) 10 MG tablet Take 1 tablet (10 mg total) by mouth at bedtime. 90 tablet 2   No current facility-administered medications on file prior to visit.    Review of Systems  Constitutional: Negative.   HENT: Negative.   Respiratory: Negative.   Cardiovascular: Negative.   Gastrointestinal: Negative.   Genitourinary: Negative.   Musculoskeletal: Negative.   Skin: Negative.   Neurological: Negative.   Psychiatric/Behavioral: Negative.     Physical Exam: Estimated body mass index is  34.17 kg/m as calculated from the following:   Height as of 07/19/17: 6\' 1"  (1.854 m).   Weight as of 07/19/17: 259 lb (117.5 kg). There were no vitals taken for this visit. General Appearance: Well nourished, in no apparent distress.  Eyes: PERRLA, EOMs, conjunctiva no swelling or erythema, normal fundi and vessels.  Sinuses: No Frontal/maxillary tenderness  ENT/Mouth: Ext aud canals clear, normal light reflex with TMs without erythema, bulging. Good dentition. No erythema, swelling, or exudate  on post pharynx. Tonsils not swollen or erythematous. Hearing normal.  Neck: Supple, thyroid normal. No bruits  Respiratory: Respiratory effort normal, BS equal bilaterally without rales, rhonchi, wheezing or stridor.  Cardio: RRR without murmurs, rubs or gallops. Brisk peripheral pulses without edema.  Chest: symmetric, with normal excursions and percussion.  Abdomen: Soft, nontender, obese,  no guarding, rebound, hernias, masses, or organomegaly. .  Lymphatics: Non tender without lymphadenopathy.  Musculoskeletal: Full ROM all peripheral extremities,5/5 strength, and normal gait.  Skin: Warm, dry without rashes, lesions, ecchymosis. Neuro: Cranial nerves intact, reflexes equal bilaterally. Normal muscle tone, no cerebellar symptoms. Sensation intact.  Psych: Awake and oriented X 3, normal affect, Insight and Judgment appropriate.    Vicie Mutters 7:38 AM [facility]

## 2017-07-28 ENCOUNTER — Ambulatory Visit: Payer: Self-pay | Admitting: Physician Assistant

## 2017-08-08 NOTE — Progress Notes (Signed)
Cardiology Office Note    Date:  08/10/2017  ID:  David Fuller, DOB June 04, 1961, MRN [id number] PCP:  [name], MD  Cardiologist:  New, reviewed with Dr. Acie Fredrickson (Dr. Acie Fredrickson treated the patient's dad remotely, passed away in 2004-02-14)   Chief Complaint: palpitations  History of Present Illness:  David Fuller is a 56 y.o. male with history of asthma, HTN, HLD, obesity, suspected OSA, seasonal allergies, diverticulitis who is being seen today for the evaluation of palpitations at the request of Dr. Rogene Houston (EDP).  He has no prior cardiac history. He was previously told he had suspected sleep apnea due to an overnight pulse ox test in the past but never had a formal sleep study due to some technical issues with scheduling with pulmonology. In June of this year he noticed sensation of palpitations following a temporary increase in caffeine intake (6 drinks per day). He quit caffeine cold [place] and these resolved. In early September he noticed about 3 days of recurrent palpitations described as skips/flips that felt like a fullness in his throat. This began shortly after he had worked outside in the extreme heat - he thinks he had lost a lot of fluid in sweat. Due to persistent symptoms he was seen in the ED 07/19/17 where he was found to have rare PVCs. Troponin was negative. CBC wnl, BMET showed K 4.7, Cr 0.93, glucose 116. Remote MG/TSH 02/13/17 wnl and LDL 146. He was advised to establish care with cardiology. He continues to have occasional palpitations most days of the week. He does not believe he's had any recent tachypalpitations. He does snore. He's been actively trying to lose weight -30lb over the last year. He wants to be healthier to be around for his 2.5-yea-old grandson. He also walks daily and rides his bike every other day. He's not had any recent chest pain, dyspnea, LEE, syncope. Palpitations improve with physical exercise.    Past Medical History:  Diagnosis Date    . Allergy   . Asthma   . Diverticulitis   . Essential hypertension   . Hemorrhoid   . Hyperlipidemia   . Kidney stones   . Obesity   . Obstructive sleep apnea 10/16/2010   Qualifier: Diagnosis of  By: Gwenette Greet MD, Armando Reichert  Not on CPAP    . Seasonal allergies 08/24/2013  . Swollen lymph nodes    abd, groin,     Past Surgical History:  Procedure Laterality Date  . VASECTOMY  MID 90S    Current Medications: Current Meds  Medication Sig  . albuterol (PROVENTIL HFA;VENTOLIN HFA) 108 (90 Base) MCG/ACT inhaler Inhale 1 puff into the lungs every 6 (six) hours as needed for wheezing or shortness of breath.  . Cholecalciferol (VITAMIN D3 PO) Take 1 capsule by mouth daily.  . fluticasone (FLONASE) 50 MCG/ACT nasal spray Place 2 sprays into both nostrils as needed for allergies.  . montelukast (SINGULAIR) 10 MG tablet Take 1 tablet (10 mg total) by mouth at bedtime.     Allergies:   Shellfish allergy and Iodine   Social History   Social History  . Marital status: Married    Spouse name: N/A  . Number of children: N/A  . Years of education: N/A   Social History Main Topics  . Smoking status: Never Smoker  . Smokeless tobacco: Former Systems developer    Types: Chew  . Alcohol use 0.0 oz/week    1 - 2 Cans of beer per week  Comment: 2OZ A WEEK  . Drug use: No  . Sexual activity: Yes   Other Topics Concern  . None   Social History Narrative  . None     Family History:  Family History  Problem Relation Age of Onset  . Diabetes Father   . Heart disease Father   . Heart failure Father   . Cancer Brother        Melanoma- left arm  . Atrial fibrillation Mother   . Stroke Mother     ROS:   Please see the history of present illness.  All other systems are reviewed and otherwise negative.    PHYSICAL EXAM:   VS:  BP 126/90   Pulse 90   Ht 6\' 1"  (1.854 m)   Wt 250 lb 12.8 oz (113.8 kg)   SpO2 97%   BMI 33.09 kg/m   BMI: Body mass index is 33.09 kg/m. GEN: Well  nourished, well developed WM in no acute distress  HEENT: normocephalic, atraumatic Neck: no JVD, carotid bruits, or masses Cardiac: RRR; no murmurs, rubs, or gallops, no edema  Respiratory:  clear to auscultation bilaterally, normal work of breathing GI: soft, nontender, nondistended, + BS MS: no deformity or atrophy  Skin: warm and dry, no rash Neuro:  Alert and Oriented x 3, Strength and sensation are intact, follows commands Psych: euthymic mood, full affect  Wt Readings from Last 3 Encounters:  08/10/17 250 lb 12.8 oz (113.8 kg)  07/19/17 259 lb (117.5 kg)  01/13/17 259 lb (117.5 kg)      Studies/Labs Reviewed:   EKG:  EKG was ordered today and personally reviewed by me and demonstrates NSR 90bpm, 1st degree AVB, occ PVCs  Recent Labs: 01/13/2017: ALT 21; Magnesium 1.8; TSH 1.40 07/19/2017: BUN 7; Creatinine, Ser 0.93; Hemoglobin 15.9; Platelets 257; Potassium 4.7; Sodium 141   Lipid Panel    Component Value Date/Time   CHOL 211 (H) 01/13/2017 1445   TRIG 89 01/13/2017 1445   HDL 47 01/13/2017 1445   CHOLHDL 4.5 01/13/2017 1445   VLDL 18 01/13/2017 1445   LDLCALC 146 (H) 01/13/2017 1445    Additional studies/ records that were reviewed today include: Summarized above.   ASSESSMENT & PLAN:   This patient's case was discussed in depth with Dr. Acie Fredrickson. The plan below was formulated per our discussion.  1. Palpitations - history c/w PVCs which were picked up in the ER recently and on EKG today. Will check TSH and Mg for completeness. Obtain echo to r/o structural disease given new onset PVCs. Obtain 48 hour holter to quantify burden. Warning sx reviewed. I am hopeful these will represent only a benign annoyance. 2. Essential HTN - diastolic slightly elevated; will follow for now. Anticipate this would improve if he does in fact have OSA. He also seems committed to long-term healthy weight loss. 3. Hyperlipidemia - followed by primary care. 4. Snoring/suspected OSA - he  had planned to wait until f/u with PCP to obtain a referral to someone other than pulmonology to further evaluate this. Will obtain sleep study and refer to Dr. Radford Pax if OSA is confirmed, given cardiac implications with increased risk of arrhythmias.  Disposition: F/u with Dr. Acie Fredrickson in 3 months.   Medication Adjustments/Labs and Tests Ordered: Current medicines are reviewed at length with the patient today.  Concerns regarding medicines are outlined above. Medication changes, Labs and Tests ordered today are summarized above and listed in the Patient Instructions accessible in Encounters.   Signed,  Charlie Pitter, PA-C  08/10/2017 12:35 PM    [facility] [place], [place], [place]  [address] Phone: [phone]; Fax: [phone]

## 2017-08-10 ENCOUNTER — Encounter: Payer: Self-pay | Admitting: Physician Assistant

## 2017-08-10 ENCOUNTER — Ambulatory Visit (INDEPENDENT_AMBULATORY_CARE_PROVIDER_SITE_OTHER): Payer: 59 | Admitting: Physician Assistant

## 2017-08-10 VITALS — BP 126/90 | HR 90 | Ht 73.0 in | Wt 250.8 lb

## 2017-08-10 DIAGNOSIS — R002 Palpitations: Secondary | ICD-10-CM | POA: Diagnosis not present

## 2017-08-10 DIAGNOSIS — I493 Ventricular premature depolarization: Secondary | ICD-10-CM | POA: Diagnosis not present

## 2017-08-10 DIAGNOSIS — E785 Hyperlipidemia, unspecified: Secondary | ICD-10-CM | POA: Diagnosis not present

## 2017-08-10 DIAGNOSIS — I10 Essential (primary) hypertension: Secondary | ICD-10-CM | POA: Diagnosis not present

## 2017-08-10 DIAGNOSIS — R0683 Snoring: Secondary | ICD-10-CM | POA: Diagnosis not present

## 2017-08-10 NOTE — Patient Instructions (Addendum)
Medication Instructions:  Your physician recommends that you continue on your current medications as directed. Please refer to the Current Medication list given to you today.   Labwork: TODAY:  MAGNESIUM & TSH  Testing/Procedures: Your physician has requested that you have an echocardiogram. Echocardiography is a painless test that uses sound waves to create images of your heart. It provides your doctor with information about the size and shape of your heart and how well your heart's chambers and valves are working. This procedure takes approximately one hour. There are no restrictions for this procedure.  Your physician has recommended that you wear a holter monitor. Holter monitors are medical devices that record the heart's electrical activity. Doctors most often use these monitors to diagnose arrhythmias. Arrhythmias are problems with the speed or rhythm of the heartbeat. The monitor is a small, portable device. You can wear one while you do your normal daily activities. This is usually used to diagnose what is causing palpitations/syncope (passing out).  Your physician has recommended that you have a sleep study. This test records several body functions during sleep, including: brain activity, eye movement, oxygen and carbon dioxide blood levels, heart rate and rhythm, breathing rate and rhythm, the flow of air through your mouth and nose, snoring, body muscle movements, and chest and belly movement.    Follow-Up: Your physician recommends that you schedule a follow-up appointment in: 3 MONTHS WITH DR. Acie Fredrickson   Any Other Special Instructions Will Be Listed Below (If Applicable).   Echocardiogram An echocardiogram, or echocardiography, uses sound waves (ultrasound) to produce an image of your heart. The echocardiogram is simple, painless, obtained within a short period of time, and offers valuable information to your health care provider. The images from an echocardiogram can provide  information such as:  Evidence of coronary artery disease (CAD).  Heart size.  Heart muscle function.  Heart valve function.  Aneurysm detection.  Evidence of a past heart attack.  Fluid buildup around the heart.  Heart muscle thickening.  Assess heart valve function.  Tell a health care provider about:  Any allergies you have.  All medicines you are taking, including vitamins, herbs, eye drops, creams, and over-the-counter medicines.  Any problems you or family members have had with anesthetic medicines.  Any blood disorders you have.  Any surgeries you have had.  Any medical conditions you have.  Whether you are pregnant or may be pregnant. What happens before the procedure? No special preparation is needed. Eat and drink normally. What happens during the procedure?  In order to produce an image of your heart, gel will be applied to your chest and a wand-like tool (transducer) will be moved over your chest. The gel will help transmit the sound waves from the transducer. The sound waves will harmlessly bounce off your heart to allow the heart images to be captured in real-time motion. These images will then be recorded.  You may need an IV to receive a medicine that improves the quality of the pictures. What happens after the procedure? You may return to your normal schedule including diet, activities, and medicines, unless your health care provider tells you otherwise. This information is not intended to replace advice given to you by your health care provider. Make sure you discuss any questions you have with your health care provider. Document Released: 10/24/2000 Document Revised: 06/14/2016 Document Reviewed: 07/04/2013 Elsevier Interactive Patient Education  2017 Elsevier Inc.     Holter Monitoring A Holter monitor is a small device  that is used to detect abnormal heart rhythms. It clips to your clothing and is connected by wires to flat, sticky disks  (electrodes) that attach to your chest. It is worn continuously for 24-48 hours. Follow these instructions at home:  Wear your Holter monitor at all times, even while exercising and sleeping, for as long as directed by your health care provider.  Make sure that the Holter monitor is safely clipped to your clothing or close to your body as recommended by your health care provider.  Do not get the monitor or wires wet.  Do not put body lotion or moisturizer on your chest.  Keep your skin clean.  Keep a diary of your daily activities, such as walking and doing chores. If you feel that your heartbeat is abnormal or that your heart is fluttering or skipping a beat: ? Record what you are doing when it happens. ? Record what time of day the symptoms occur.  Return your Holter monitor as directed by your health care provider.  Keep all follow-up visits as directed by your health care provider. This is important. Get help right away if:  You feel lightheaded or you faint.  You have trouble breathing.  You feel pain in your chest, upper arm, or jaw.  You feel sick to your stomach and your skin is pale, cool, or damp.  You heartbeat feels unusual or abnormal. This information is not intended to replace advice given to you by your health care provider. Make sure you discuss any questions you have with your health care provider. Document Released: 07/25/2004 Document Revised: 04/03/2016 Document Reviewed: 06/05/2014 Elsevier Interactive Patient Education  2018 [employer].   Sleep Studies A sleep study (polysomnogram) is a series of tests done while you are sleeping. It can show how well you sleep. This can help your health care provider diagnose a sleep disorder and show how severe your sleep disorder is. A sleep study may lead to treatment that will help you sleep better and prevent other medical problems caused by poor sleep. If you have a sleep disorder, you may also be at risk  for:  Sleep-related accidents.  High blood pressure.  Heart disease.  Stroke.  Other medical conditions.  Sleep disorders are common. Your health care provider may suspect a sleep disorder if you:  Have loud snoring most nights.  Have brief periods when you stop breathing at night.  Feel sleepy on most days.  Fall asleep suddenly during the day.  Have trouble falling asleep or staying asleep.  Feel like you need to move your legs when trying to fall asleep.  Have dreams that seem very real shortly after falling asleep.  Feel like you cannot move when you first wake up.  Which tests will I need to have? Most sleep studies last all night and include these tests:  Recordings of your brain activity.  Recordings of your eye movements.  Recording of your heart rate and rhythm.  Blood pressure readings.  Readings of the amount of oxygen in your blood.  Measurements of your chest and belly movement as you breathe during sleep.  If you have signs of the sleep disorder called sleep apnea during your test, you may get a mask to wear for the second half of the night.  The mask provides continuous positive airway pressure (CPAP). This may improve sleep apnea significantly.  You will then have all tests done again with the mask in place to see if your measurements  and recordings change.  How are sleep studies done? Most sleep studies are done over one full night of sleep.  You will arrive at the study center in the evening and can go home in the morning.  Bring your pajamas and toothbrush.  Do not have caffeine on the day of your sleep study.  Your health care provider will let you know if you need to stop taking any of your regular medicines before the test.  To do the tests included in a polysomnogram, you will have:  Round, sticky patches with sensors attached to recording wires (electrodes) placed on your scalp, face, chest, and limbs.  Wires from all the  electrodes and sensors run from your bed to a computer. The wires can be taken off and put back on if you need to get out of bed to go to the bathroom.  A sensor placed over your nose to measure airflow.  A finger clip put on one finger to measure your blood oxygen level.  A belt around your belly and a belt around your chest to measure breathing movements.  Where are sleep studies done? Sleep studies are done at sleep centers. A sleep center may be inside a hospital, office, or clinic. The room where you have the study may look like a hospital room or a hotel room. The health care providers doing the study may come in and out of the room during the study. Most of the time, they will be in another room monitoring your test. How is information from sleep studies helpful? A polysomnogram can be used along with your medical history and a physical exam to diagnose conditions, such as:  Sleep apnea.  Restless legs syndrome.  Sleep-related seizure disorders.  Sleep-related movement disorders.  A medical doctor who specializes in sleep will evaluate your sleep study. The specialist will share the results with your primary health care provider. Treatments based on your sleep study may include:  Improving your sleep habits (sleep hygiene).  Wearing a CPAP mask.  Wearing an oral device at night to improve breathing and reduce snoring.  Taking medicine for: ? Restless legs syndrome. ? Sleep-related seizure disorder. ? Sleep-related movement disorder.  This information is not intended to replace advice given to you by your health care provider. Make sure you discuss any questions you have with your health care provider. Document Released: 05/03/2003 Document Revised: 06/22/2016 Document Reviewed: 01/02/2014 Elsevier Interactive Patient Education  [employer].   If you need a refill on your cardiac medications before your next appointment, please call your pharmacy.

## 2017-08-11 ENCOUNTER — Ambulatory Visit (INDEPENDENT_AMBULATORY_CARE_PROVIDER_SITE_OTHER): Payer: 59

## 2017-08-11 DIAGNOSIS — R002 Palpitations: Secondary | ICD-10-CM

## 2017-08-11 LAB — MAGNESIUM: MAGNESIUM: 2.2 mg/dL (ref 1.6–2.3)

## 2017-08-11 LAB — TSH: TSH: 2.28 u[IU]/mL (ref 0.450–4.500)

## 2017-08-13 ENCOUNTER — Telehealth: Payer: Self-pay | Admitting: *Deleted

## 2017-08-13 NOTE — Telephone Encounter (Signed)
-----   Message from Jeanann Lewandowsky, [place] sent at 08/10/2017 12:29 PM EDT ----- PT NEEDS A SLEEP STUDY

## 2017-08-13 NOTE — Telephone Encounter (Signed)
Informed patient of upcoming sleep study and patient understanding was verbalized. Patient understands his sleep study is scheduled for Tuesday September 29 2017. Patient understands his sleep study will be done at [facility] sleep lab. Patient understands he will receive a sleep packet in a week or so. Patient understands to call if he does not receive the sleep packet in a timely manner. Patient agrees with treatment and thanked me for call.

## 2017-08-14 ENCOUNTER — Other Ambulatory Visit: Payer: Self-pay

## 2017-08-14 ENCOUNTER — Encounter: Payer: Self-pay | Admitting: Physician Assistant

## 2017-08-14 ENCOUNTER — Ambulatory Visit (HOSPITAL_COMMUNITY): Payer: 59 | Attending: Cardiovascular Disease

## 2017-08-14 DIAGNOSIS — R002 Palpitations: Secondary | ICD-10-CM | POA: Diagnosis not present

## 2017-08-14 DIAGNOSIS — E669 Obesity, unspecified: Secondary | ICD-10-CM | POA: Insufficient documentation

## 2017-08-14 DIAGNOSIS — E785 Hyperlipidemia, unspecified: Secondary | ICD-10-CM | POA: Insufficient documentation

## 2017-08-14 DIAGNOSIS — I10 Essential (primary) hypertension: Secondary | ICD-10-CM | POA: Insufficient documentation

## 2017-08-14 DIAGNOSIS — G4733 Obstructive sleep apnea (adult) (pediatric): Secondary | ICD-10-CM | POA: Insufficient documentation

## 2017-08-14 DIAGNOSIS — J45909 Unspecified asthma, uncomplicated: Secondary | ICD-10-CM | POA: Diagnosis not present

## 2017-08-16 ENCOUNTER — Other Ambulatory Visit: Payer: Self-pay

## 2017-08-16 ENCOUNTER — Emergency Department (HOSPITAL_COMMUNITY): Payer: 59

## 2017-08-16 ENCOUNTER — Encounter (HOSPITAL_COMMUNITY): Payer: Self-pay | Admitting: *Deleted

## 2017-08-16 ENCOUNTER — Emergency Department (HOSPITAL_COMMUNITY)
Admission: EM | Admit: 2017-08-16 | Discharge: 2017-08-16 | Disposition: A | Discharge status: Home / Self Care | Payer: 59 | Attending: Emergency Medicine | Admitting: Emergency Medicine

## 2017-08-16 DIAGNOSIS — Z79899 Other long term (current) drug therapy: Secondary | ICD-10-CM | POA: Insufficient documentation

## 2017-08-16 DIAGNOSIS — J45909 Unspecified asthma, uncomplicated: Secondary | ICD-10-CM | POA: Diagnosis not present

## 2017-08-16 DIAGNOSIS — R000 Tachycardia, unspecified: Secondary | ICD-10-CM | POA: Diagnosis not present

## 2017-08-16 DIAGNOSIS — R42 Dizziness and giddiness: Secondary | ICD-10-CM | POA: Diagnosis not present

## 2017-08-16 DIAGNOSIS — I10 Essential (primary) hypertension: Secondary | ICD-10-CM | POA: Diagnosis not present

## 2017-08-16 DIAGNOSIS — Z87891 Personal history of nicotine dependence: Secondary | ICD-10-CM | POA: Insufficient documentation

## 2017-08-16 DIAGNOSIS — R002 Palpitations: Secondary | ICD-10-CM

## 2017-08-16 LAB — BASIC METABOLIC PANEL
Anion gap: 11 (ref 5–15)
BUN: 16 mg/dL (ref 6–20)
CHLORIDE: 104 mmol/L (ref 101–111)
CO2: 20 mmol/L — ABNORMAL LOW (ref 22–32)
Calcium: 9.4 mg/dL (ref 8.9–10.3)
Creatinine, Ser: 0.87 mg/dL (ref 0.61–1.24)
Glucose, Bld: 138 mg/dL — ABNORMAL HIGH (ref 65–99)
POTASSIUM: 3.6 mmol/L (ref 3.5–5.1)
SODIUM: 135 mmol/L (ref 135–145)

## 2017-08-16 LAB — CBC
HEMATOCRIT: 46.4 % (ref 39.0–52.0)
Hemoglobin: 16.5 g/dL (ref 13.0–17.0)
MCH: 29.9 pg (ref 26.0–34.0)
MCHC: 35.6 g/dL (ref 30.0–36.0)
MCV: 84.2 fL (ref 78.0–100.0)
PLATELETS: 259 10*3/uL (ref 150–400)
RBC: 5.51 MIL/uL (ref 4.22–5.81)
RDW: 12.8 % (ref 11.5–15.5)
WBC: 9.7 10*3/uL (ref 4.0–10.5)

## 2017-08-16 LAB — TROPONIN I: Troponin I: <0.03 ng/mL (ref <0.03)

## 2017-08-16 LAB — CBG MONITORING, ED: GLUCOSE-CAPILLARY: 150 mg/dL — AB (ref 65–99)

## 2017-08-16 MED ORDER — METOPROLOL TARTRATE 50 MG PO TABS: 25.0000 mg | ORAL_TABLET | Freq: Two times a day (BID) | ORAL | 0 refills | Status: DC

## 2017-08-16 MED ORDER — METOPROLOL TARTRATE 25 MG PO TABS
25.0000 mg | ORAL_TABLET | Freq: Once | ORAL | Status: AC
  Administered 2017-08-16: 25 mg via ORAL
  Filled 2017-08-16: qty 1

## 2017-08-16 MED ORDER — METOPROLOL TARTRATE 5 MG/5ML IV SOLN: 5.0000 mg | Freq: Once | INTRAVENOUS | Status: DC

## 2017-08-16 NOTE — ED Triage Notes (Signed)
The pt is c/o palpitatiois since this afternoon with periods of dizziness hx of the same  No dizziness now.  Alert oriented skin warm and dry

## 2017-08-16 NOTE — ED Notes (Signed)
Pt states palpitations come and go there is no certain pattern but today he felt a little dizzy.

## 2017-08-16 NOTE — ED Provider Notes (Addendum)
[facility] DEPT Provider Note   CSN: [id number] Arrival date & time: 08/16/17  2008     History   Chief Complaint Chief Complaint  Patient presents with  . Palpitations    HPI David Fuller is a 56 y.o. male.  HPI  56 year old male presents today complaining of palpitations. He states he first noted palpitations in mid June after he had had a lot of caffeine. He started drinking caffeine. One month ago he had similar palpitations and was seen in the ED. He was noted to have some PVCs have follow-up with cardiologist. He was followed up with cardiology this week and had a Holter monitor for 2 days. He had an echo on Friday she states showed an enlarged aorta and some diastolic dysfunction. He has not been started a medications. He continues to feel like he is having some palpitations and is concerned that he could pass out. He felt somewhat lightheaded today otherwise he has been well. He denies any headache, head injury, chest pain, dyspnea, nausea, vomiting, fever, or chills.  Past Medical History:  Diagnosis Date  . Allergy   . Asthma   . Diverticulitis   . Essential hypertension   . Hemorrhoid   . Hyperlipidemia   . Kidney stones   . Mild dilation of ascending aorta (HCC)   . Obesity   . Obstructive sleep apnea 10/16/2010   Qualifier: Diagnosis of  By: Gwenette Greet MD, Armando Reichert  Not on CPAP    . Seasonal allergies 08/24/2013  . Swollen lymph nodes    abd, groin,     Patient Active Problem List   Diagnosis Date Noted  . Essential hypertension 09/17/2015  . Morbid obesity ([facility]) 09/17/2015  . History of nephrolithiasis 09/17/2015  . Diverticulitis 09/17/2015  . Hyperlipidemia 08/24/2013  . Seasonal allergies 08/24/2013  . Asthma 08/24/2013  . Obstructive sleep apnea 10/16/2010    Past Surgical History:  Procedure Laterality Date  . VASECTOMY  MID 90S       Home Medications    Prior to Admission medications   Medication Sig Start Date End Date Taking?  Authorizing Provider  albuterol (PROVENTIL HFA;VENTOLIN HFA) 108 (90 Base) MCG/ACT inhaler Inhale 1 puff into the lungs every 6 (six) hours as needed for wheezing or shortness of breath.    [provider]  Cholecalciferol (VITAMIN D3 PO) Take 1 capsule by mouth daily.    [provider]  fluticasone (FLONASE) 50 MCG/ACT nasal spray Place 2 sprays into both nostrils as needed for allergies. 06/17/16   Forcucci, Courtney, PA-C  montelukast (SINGULAIR) 10 MG tablet Take 1 tablet (10 mg total) by mouth at bedtime. 01/13/17   Vicie Mutters, PA-C    Family History Family History  Problem Relation Age of Onset  . Diabetes Father   . Heart disease Father   . Heart failure Father   . Cancer Brother        Melanoma- left arm  . Atrial fibrillation Mother   . Stroke Mother     Social History Social History  Substance Use Topics  . Smoking status: Never Smoker  . Smokeless tobacco: Former Systems developer    Types: Chew  . Alcohol use 0.0 oz/week    1 - 2 Cans of beer per week     Comment: 2OZ A WEEK     Allergies   Shellfish allergy and Iodine   Review of Systems Review of Systems  All other systems reviewed and are negative.    Physical  Exam Updated Vital Signs BP (!) 146/88   Pulse 94   Temp 97.7 F (36.5 C) (Oral)   Resp (!) 23   SpO2 98%   Physical Exam  Constitutional: He is oriented to person, place, and time. He appears well-developed and well-nourished.  HENT:  Head: Normocephalic and atraumatic.  Right Ear: External ear normal.  Left Ear: External ear normal.  Nose: Nose normal.  Mouth/Throat: Oropharynx is clear and moist.  Eyes: Pupils are equal, round, and reactive to light. Conjunctivae and EOM are normal.  Neck: Normal range of motion.  Cardiovascular: Normal rate, regular rhythm, normal heart sounds and intact distal pulses.   Pulmonary/Chest: Effort normal and breath sounds normal.  Abdominal: Soft. Bowel sounds are normal.  Musculoskeletal:  Normal range of motion. He exhibits no edema.  Neurological: He is alert and oriented to person, place, and time.  Skin: Skin is warm. Capillary refill takes less than 2 seconds.  Psychiatric: He has a normal mood and affect.  Nursing note and vitals reviewed.    ED Treatments / Results  Labs (all labs ordered are listed, but only abnormal results are displayed) Labs Reviewed  BASIC METABOLIC PANEL - Abnormal; Notable for the following:       Result Value   CO2 20 (*)    Glucose, Bld 138 (*)    All other components within normal limits  CBG MONITORING, ED - Abnormal; Notable for the following:    Glucose-Capillary 150 (*)    All other components within normal limits  CBC  TROPONIN I    EKG  EKG Interpretation  Date/Time:  Sunday August 16 2017 20:14:21 EDT Ventricular Rate:  109 PR Interval:  184 QRS Duration: 92 QT Interval:  326 QTC Calculation: 439 R Axis:   102 Text Interpretation:  Sinus tachycardia with occasional Premature ventricular complexes Possible Left atrial enlargement Rightward axis Incomplete right bundle branch block Borderline ECG Confirmed by Pattricia Boss [phone]) on 08/16/2017 9:17:58 PM       Radiology Dg Chest 2 View  Result Date: 08/16/2017 CLINICAL DATA:  Tachycardia and lightheadedness tonight, postprandial. EXAM: CHEST  2 VIEW COMPARISON:  07/19/2017 FINDINGS: The lungs are clear. The pulmonary vasculature is normal. Heart size is normal. Hilar and mediastinal contours are unremarkable. There is no pleural effusion. IMPRESSION: No active cardiopulmonary disease. Electronically Signed   By: Andreas Newport M.D.   On: 08/16/2017 21:14    Procedures Procedures (including critical care time)  Medications Ordered in ED Medications  metoprolol tartrate (LOPRESSOR) injection 5 mg (not administered)     Initial Impression / Assessment and Plan / ED Course  I have reviewed the triage vital signs and the nursing notes.  Pertinent labs &  imaging results that were available during my care of the patient were reviewed by me and considered in my medical decision making (see chart for details).     Plan to assess with laboratories. He is given IV metoprolol.  10:32 PM Patient did not receive IV metoprolol. His labs are back and are normal. He is been hemodynamically stable on monitor. We'll give by mouth Lopressor and discharged home to follow-up with cardiology. Final Clinical Impressions(s) / ED Diagnoses   Final diagnoses:  Palpitations    New Prescriptions New Prescriptions   METOPROLOL TARTRATE (LOPRESSOR) 50 MG TABLET    Take 0.5 tablets (25 mg total) by mouth 2 (two) times daily.     Pattricia Boss, MD 08/16/17 [id number]    Pattricia Boss, MD  08/16/17 [id number]  

## 2017-08-16 NOTE — ED Notes (Signed)
PT states understanding of care given, follow up care, and medication prescribed. PT ambulated from ED to car with a steady gait. 

## 2017-08-16 NOTE — Discharge Instructions (Signed)
Please take lopressor and follow up with your cardiologist as scheduled.

## 2017-08-18 ENCOUNTER — Telehealth: Payer: Self-pay | Admitting: Cardiovascular Disease

## 2017-08-18 NOTE — Telephone Encounter (Signed)
F/u message  Pt states he is returning RN call .please call back to discuss

## 2017-08-18 NOTE — Telephone Encounter (Signed)
Spoke with patient to review monitor results and Dr. Elmarie Shiley advice. He states he was prescribed metoprolol 25 mg BID but decreased the dose on his own to 12.5 mg BID because of the fatigue. He asked if the metoprolol would also help with the findings on the echo and we reviewed those in detail. I answered questions to his satisfaction and scheduled an appointment for him with Dr. Acie Fredrickson on 11/6. He states he is working on losing 50 lb and is aware he can continue to exercise and monitor symptoms. He thanked me for the call.

## 2017-08-19 ENCOUNTER — Encounter: Payer: Self-pay | Admitting: Physician Assistant

## 2017-08-19 ENCOUNTER — Ambulatory Visit (INDEPENDENT_AMBULATORY_CARE_PROVIDER_SITE_OTHER): Payer: 59 | Admitting: Physician Assistant

## 2017-08-19 VITALS — BP 124/82 | HR 78 | Temp 97.7°F | Resp 14 | Ht 73.0 in | Wt 244.0 lb

## 2017-08-19 DIAGNOSIS — F419 Anxiety disorder, unspecified: Secondary | ICD-10-CM | POA: Diagnosis not present

## 2017-08-19 DIAGNOSIS — Z23 Encounter for immunization: Secondary | ICD-10-CM | POA: Diagnosis not present

## 2017-08-19 DIAGNOSIS — E785 Hyperlipidemia, unspecified: Secondary | ICD-10-CM

## 2017-08-19 DIAGNOSIS — I493 Ventricular premature depolarization: Secondary | ICD-10-CM

## 2017-08-19 DIAGNOSIS — Z79899 Other long term (current) drug therapy: Secondary | ICD-10-CM | POA: Diagnosis not present

## 2017-08-19 DIAGNOSIS — I10 Essential (primary) hypertension: Secondary | ICD-10-CM

## 2017-08-19 DIAGNOSIS — E559 Vitamin D deficiency, unspecified: Secondary | ICD-10-CM | POA: Diagnosis not present

## 2017-08-19 LAB — BASIC METABOLIC PANEL WITH GFR
BUN: 14 mg/dL (ref 7–25)
CO2: 25 mmol/L (ref 20–32)
CREATININE: 1.03 mg/dL (ref 0.70–1.33)
Calcium: 9.4 mg/dL (ref 8.6–10.3)
Chloride: 105 mmol/L (ref 98–110)
GFR, EST AFRICAN AMERICAN: 94 mL/min/{1.73_m2} (ref >=60)
GFR, Est Non African American: 81 mL/min/{1.73_m2} (ref >=60)
Glucose, Bld: 100 mg/dL — ABNORMAL HIGH (ref 65–99)
Potassium: 4.1 mmol/L (ref 3.5–5.3)
Sodium: 139 mmol/L (ref 135–146)

## 2017-08-19 LAB — CBC WITH DIFFERENTIAL/PLATELET
BASOS ABS: 32 {cells}/uL (ref 0–200)
BASOS PCT: 0.4 %
Eosinophils Absolute: 88 cells/uL (ref 15–500)
Eosinophils Relative: 1.1 %
HCT: 47.1 % (ref 38.5–50.0)
HEMOGLOBIN: 16.4 g/dL (ref 13.2–17.1)
Lymphs Abs: 1424 cells/uL (ref 850–3900)
MCH: 29.9 pg (ref 27.0–33.0)
MCHC: 34.8 g/dL (ref 32.0–36.0)
MCV: 85.8 fL (ref 80.0–100.0)
MPV: 10 fL (ref 7.5–12.5)
Monocytes Relative: 8.1 %
Neutro Abs: 5808 cells/uL (ref 1500–7800)
Neutrophils Relative %: 72.6 %
Platelets: 320 10*3/uL (ref 140–400)
RBC: 5.49 10*6/uL (ref 4.20–5.80)
RDW: 13.2 % (ref 11.0–15.0)
Total Lymphocyte: 17.8 %
WBC mixed population: 648 cells/uL (ref 200–950)
WBC: 8 10*3/uL (ref 3.8–10.8)

## 2017-08-19 LAB — HEPATIC FUNCTION PANEL
AG RATIO: 1.7 (calc) (ref 1.0–2.5)
ALBUMIN MSPROF: 4.3 g/dL (ref 3.6–5.1)
ALT: 31 U/L (ref 9–46)
AST: 23 U/L (ref 10–35)
Alkaline phosphatase (APISO): 67 U/L (ref 40–115)
Bilirubin, Direct: 0.2 mg/dL (ref 0.0–0.2)
GLOBULIN: 2.6 g/dL (ref 1.9–3.7)
Indirect Bilirubin: 1.2 mg/dL (calc) (ref 0.2–1.2)
TOTAL PROTEIN: 6.9 g/dL (ref 6.1–8.1)
Total Bilirubin: 1.4 mg/dL — ABNORMAL HIGH (ref 0.2–1.2)

## 2017-08-19 LAB — TSH: TSH: 2.05 mIU/L (ref 0.40–4.50)

## 2017-08-19 LAB — LIPID PANEL
CHOL/HDL RATIO: 4.2 (calc) (ref <5.0)
CHOLESTEROL: 201 mg/dL — AB (ref <200)
HDL: 48 mg/dL (ref >40)
LDL CHOLESTEROL (CALC): 138 mg/dL — AB
Non-HDL Cholesterol (Calc): 153 mg/dL (calc) — ABNORMAL HIGH (ref <130)
TRIGLYCERIDES: 55 mg/dL (ref <150)

## 2017-08-19 LAB — MAGNESIUM: Magnesium: 2 mg/dL (ref 1.5–2.5)

## 2017-08-19 MED ORDER — ALPRAZOLAM 0.5 MG PO TABS: 0.5000 mg | ORAL_TABLET | Freq: Every evening | ORAL | 0 refills | Status: DC | PRN

## 2017-08-19 NOTE — Patient Instructions (Addendum)
  Vitamin D goal is between 60-80  Please make sure that you are taking your Vitamin D as directed.   It is very important as a natural anti-inflammatory   helping hair, skin, and nails, as well as reducing stroke and heart attack risk.   It helps your bones and helps with mood.  We want you on at least 5000 IU daily  It also decreases numerous cancer risks so please take it as directed.   Low Vit D is associated with a 200-300% higher risk for CANCER   and 200-300% higher risk for HEART   ATTACK  &  STROKE.    .....................................[name] Kitchen  It is also associated with higher death rate at younger ages,   autoimmune diseases like Rheumatoid arthritis, Lupus, Multiple Sclerosis.     Also many other serious conditions, like depression, Alzheimer's  Dementia, infertility, muscle aches, fatigue, fibromyalgia - just to name a few.  +++++++++++++++++++  Can get liquid vitamin D from [employer] here in [place] at  [facility] alternatives [address] [place], [place] [address] Or you can try earth [employer]   New guidelines suggest the benzodiazepines are best short term, with prolonged use they lead to physical and psychological dependence. In addition, evidence suggest that for insomnia the effectiveness wanes in 4 weeks and the risks out weight their benefits. Use of these agents have been associated with dementia, falls, motor vehicle accidents and physical addiction. Decreasing these medication have been proven to show improvements in cognition, alertness, decrease of falls and daytime sedation.   We will start a slow taper, symptoms of withdrawal include, insomnia, anxiety, irritability, sweating and stomach or intestinal symptoms like diarrhea or nausea.

## 2017-08-19 NOTE — Progress Notes (Signed)
Assessment and Plan:  Hyperlipidemia -continue medications, check lipids, decrease fatty foods, increase activity.  - Lipid panel - EKG 12-Lead   Essential hypertension - continue medications, DASH diet, exercise and monitor at home. Call if greater than 130/80.  - CBC with Differential/Platelet - BASIC METABOLIC PANEL WITH GFR - TSH - Hepatic function panel   morbid Obesity Obesity with co morbidities- long discussion about weight loss, diet, and exercise - Find out which nutritionist is in network and i can send referral.    Medication management - Magnesium  Vitamin D deficiency - Vit D  25 hydroxy (rtn osteoporosis monitoring)  Needs flu shot -     FLU VACCINE MDCK QUAD W/Preservative  Anxiety -     ALPRAZolam (XANAX) 0.5 MG tablet; Take 1 tablet (0.5 mg total) by mouth at bedtime as needed for anxiety or sleep. - will do for sleep, if needs long term will do other options  PVC (premature ventricular contraction) Check labs, follow up cardio, getting sleep study Nov 21st, continue weight loss and BB  . Discussed med's effects and SE's. Screening labs and tests as requested with regular follow-up as recommended. Future Appointments Date Time Provider [place]  08/19/2017 9:45 AM Vicie Mutters, PA-C [facility] None  09/15/2017 1:40 PM Nahser, Wonda Cheng, MD [facility]  09/29/2017 8:00 PM [facility] ROOM 6 [facility]  01/14/2018 2:00 PM Vicie Mutters, PA-C [facility] None      HPI 56 y.o. WM with history of HTN, chol, vitamin D def presents for follow up.   His blood pressure has been controlled at home, today their BP is BP: 124/82 He does workout, on tread climber/walking. He denies chest pain, shortness of breath, dizziness.  He has had palpitations, following with Dr. Sondra Come, had echo and holter monitor, only PVCs.  He is not on cholesterol medication.[name] Kitchen His cholesterol is at goal. The cholesterol last visit was:   Lab Results   Component Value Date   CHOL 211 (H) 01/13/2017   HDL 47 01/13/2017   LDLCALC 146 (H) 01/13/2017   TRIG 89 01/13/2017   CHOLHDL 4.5 01/13/2017   Last A1C in the office was:  Lab Results  Component Value Date   HGBA1C 5.6 09/17/2015   Patient is NOT on Vitamin D supplement.   Lab Results  Component Value Date   VD25OH 29 (L) 01/13/2017     BMI is Body mass index is 32.19 kg/m., he is working on diet and exercise. He has a sleep study scheduled with [facility] in Nov. Has had increased stress with daughter going through divorce.  Wt Readings from Last 3 Encounters:  08/19/17 244 lb (110.7 kg)  08/10/17 250 lb 12.8 oz (113.8 kg)  07/19/17 259 lb (117.5 kg)   He has asthma/allergies and is on singulair , allegra, and has albuterol which helps.   Current Medications:  Current Outpatient Prescriptions on File Prior to Visit  Medication Sig Dispense Refill  . albuterol (PROVENTIL HFA;VENTOLIN HFA) 108 (90 Base) MCG/ACT inhaler Inhale 1 puff into the lungs every 6 (six) hours as needed for wheezing or shortness of breath.    . Cholecalciferol (VITAMIN D3 PO) Take 1 capsule by mouth daily.    . fluticasone (FLONASE) 50 MCG/ACT nasal spray Place 2 sprays into both nostrils as needed for allergies. 16 g 3  . metoprolol tartrate (LOPRESSOR) 50 MG tablet Take 0.5 tablets (25 mg total) by mouth 2 (two) times daily. 30 tablet 0  . montelukast (SINGULAIR) 10  MG tablet Take 1 tablet (10 mg total) by mouth at bedtime. 90 tablet 2   No current facility-administered medications on file prior to visit.    Review of Systems  Constitutional: Negative.   HENT: Negative.   Respiratory: Negative.   Cardiovascular: Negative.   Gastrointestinal: Negative.   Genitourinary: Negative.   Musculoskeletal: Negative.   Skin: Negative.   Neurological: Negative.   Psychiatric/Behavioral: Negative.     Physical Exam: Estimated body mass index is 32.19 kg/m as calculated from the following:   Height  as of this encounter: 6\' 1"  (1.854 m).   Weight as of this encounter: 244 lb (110.7 kg). BP 124/82   Pulse 78   Temp 97.7 F (36.5 C)   Resp 14   Ht 6\' 1"  (1.854 m)   Wt 244 lb (110.7 kg)   SpO2 98%   BMI 32.19 kg/m  General Appearance: Well nourished, in no apparent distress.  Eyes: PERRLA, EOMs, conjunctiva no swelling or erythema, normal fundi and vessels.  Sinuses: No Frontal/maxillary tenderness  ENT/Mouth: Ext aud canals clear, normal light reflex with TMs without erythema, bulging. Good dentition. No erythema, swelling, or exudate on post pharynx. Tonsils not swollen or erythematous. Hearing normal.  Neck: Supple, thyroid normal. No bruits  Respiratory: Respiratory effort normal, BS equal bilaterally without rales, rhonchi, wheezing or stridor.  Cardio: RRR without murmurs, rubs or gallops. Brisk peripheral pulses without edema.  Chest: symmetric, with normal excursions and percussion.  Abdomen: Soft, nontender, obese,  no guarding, rebound, hernias, masses, or organomegaly. .  Lymphatics: Non tender without lymphadenopathy.  Musculoskeletal: Full ROM all peripheral extremities,5/5 strength, and normal gait.  Skin: Warm, dry without rashes, lesions, ecchymosis. Neuro: Cranial nerves intact, reflexes equal bilaterally. Normal muscle tone, no cerebellar symptoms. Sensation intact.  Psych: Awake and oriented X 3, normal affect, Insight and Judgment appropriate.    Vicie Mutters 9:42 AM [facility]

## 2017-08-21 NOTE — Progress Notes (Signed)
Pt aware of lab results & voiced understanding of those results.

## 2017-09-04 ENCOUNTER — Ambulatory Visit: Payer: 59 | Admitting: Cardiovascular Disease

## 2017-09-15 ENCOUNTER — Ambulatory Visit (INDEPENDENT_AMBULATORY_CARE_PROVIDER_SITE_OTHER): Payer: 59 | Admitting: Cardiovascular Disease

## 2017-09-15 ENCOUNTER — Encounter: Payer: Self-pay | Admitting: Cardiovascular Disease

## 2017-09-15 VITALS — BP 122/80 | HR 86 | Ht 73.0 in | Wt 251.8 lb

## 2017-09-15 DIAGNOSIS — I493 Ventricular premature depolarization: Secondary | ICD-10-CM | POA: Diagnosis not present

## 2017-09-15 DIAGNOSIS — I10 Essential (primary) hypertension: Secondary | ICD-10-CM

## 2017-09-15 MED ORDER — METOPROLOL TARTRATE 50 MG PO TABS: 25.0000 mg | ORAL_TABLET | Freq: Two times a day (BID) | ORAL | 3 refills | Status: DC

## 2017-09-15 NOTE — Patient Instructions (Signed)

## 2017-09-15 NOTE — Progress Notes (Signed)
Cardiology Office Note    Date:  09/15/2017  ID:  GUINN Fuller, DOB 1960-12-09, MRN [id number] PCP:  [name], MD  Cardiologist:  [facility], Dr. Acie Fredrickson    Chief Complaint: palpitations  History of Present Illness:     Previous notes per David Copa, PA   David Fuller  is a 56 y.o. male with history of asthma, HTN, HLD, obesity, suspected OSA, seasonal allergies, diverticulitis who is being seen today for the evaluation of palpitations at the request of Dr. Rogene Fuller (EDP).  He has no prior cardiac history. He was previously told he had suspected sleep apnea due to an overnight pulse ox test in the past but never had a formal sleep study due to some technical issues with scheduling with pulmonology. In June of this year he noticed sensation of palpitations following a temporary increase in caffeine intake (6 drinks per day). He quit caffeine cold David Fuller and these resolved. In early September he noticed about 3 days of recurrent palpitations described as skips/flips that felt like a fullness in his throat. This began shortly after he had worked outside in the extreme heat - he thinks he had lost a lot of fluid in sweat. Due to persistent symptoms he was seen in the ED 07/19/17 where he was found to have rare PVCs. Troponin was negative. CBC wnl, BMET showed K 4.7, Cr 0.93, glucose 116. Remote MG/TSH 01/2017 wnl and LDL 146. He was advised to establish care with cardiology. He continues to have occasional palpitations most days of the week. He does not believe he's had any recent tachypalpitations. He does snore. He's been actively trying to lose weight -30lb over the last year. He wants to be healthier to be around for his 2.5-yea-old grandson. He also walks daily and rides his bike every other day. He's not had any recent chest pain, dyspnea, LEE, syncope. Palpitations improve with physical exercise.  Nov. 6, 2018:  Doing better  Still having palpitations Worse with  caffiene. Is under lots of stress .   Echocardiogram from October 5 shows normal left ventricular systolic function with EF 65-70%.  He has grade 1 diastolic dysfunction.  There is very minimal ascending aortic dilatation.  Normal pulmonary artery pressures. Tries to watch his salt .   Occasional hot dog.  Has lost 32 lbs over the past year .   Walks , rides his bike   Past Medical History:  Diagnosis Date  . Allergy   . Asthma   . Diverticulitis   . Essential hypertension   . Hemorrhoid   . Hyperlipidemia   . Kidney stones   . Mild dilation of ascending aorta (HCC)   . Obesity   . Obstructive sleep apnea 10/16/2010   Qualifier: Diagnosis of  By: David Greet MD, David Fuller  Not on CPAP    . Seasonal allergies 08/24/2013  . Swollen lymph nodes    abd, groin,     Past Surgical History:  Procedure Laterality Date  . VASECTOMY  MID 90S    Current Medications: Current Meds  Medication Sig  . albuterol (PROVENTIL HFA;VENTOLIN HFA) 108 (90 Base) MCG/ACT inhaler Inhale 1 puff into the lungs every 6 (six) hours as needed for wheezing or shortness of breath.  . ALPRAZolam (XANAX) 0.5 MG tablet Take 1 tablet (0.5 mg total) by mouth at bedtime as needed for anxiety or sleep.  . Cholecalciferol (VITAMIN D3 PO) Take 1 capsule by mouth daily.  . fluticasone (FLONASE) 50 MCG/ACT nasal  spray Place 2 sprays into both nostrils as needed for allergies.  . metoprolol tartrate (LOPRESSOR) 50 MG tablet Take 0.5 tablets (25 mg total) by mouth 2 (two) times daily.  . montelukast (SINGULAIR) 10 MG tablet Take 1 tablet (10 mg total) by mouth at bedtime.     Allergies:   Shellfish allergy and Iodine   Social History   Socioeconomic History  . Marital status: Married    Spouse name: None  . Number of children: None  . Years of education: None  . Highest education level: None  Social Needs  . Financial resource strain: None  . Food insecurity - worry: None  . Food insecurity - inability: None  .  Transportation needs - medical: None  . Transportation needs - non-medical: None  Occupational History  . None  Tobacco Use  . Smoking status: Never Smoker  . Smokeless tobacco: Former Systems developer    Types: Chew  Substance and Sexual Activity  . Alcohol use: Yes    Alcohol/week: 0.0 oz    Types: 1 - 2 Cans of beer per week    Comment: 2OZ A WEEK  . Drug use: No  . Sexual activity: Yes  Other Topics Concern  . None  Social History Narrative  . None     Family History:  Family History  Problem Relation Age of Onset  . Diabetes Father   . Heart disease Father   . Heart failure Father   . Cancer Brother        Melanoma- left arm  . Atrial fibrillation Mother   . Stroke Mother     ROS:   Please see the history of present illness.  All other systems are reviewed and otherwise negative.   Physical Exam: Blood pressure 122/80, pulse 86, height 6\' 1"  (1.854 m), weight 251 lb 12.8 oz (114.2 kg), SpO2 97 %.  GEN:  Well nourished, well developed in no acute distress HEENT: Normal NECK: No JVD; No carotid bruits LYMPHATICS: No lymphadenopathy CARDIAC: RR, no murmurs, rubs, gallops RESPIRATORY:  Clear to auscultation without rales, wheezing or rhonchi  ABDOMEN: Soft, non-tender, non-distended MUSCULOSKELETAL:  No edema; No deformity  SKIN: Warm and dry NEUROLOGIC:  Alert and oriented x 3   Wt Readings from Last 3 Encounters:  09/15/17 251 lb 12.8 oz (114.2 kg)  08/19/17 244 lb (110.7 kg)  08/10/17 250 lb 12.8 oz (113.8 kg)      Studies/Labs Reviewed:   EKG:   Recent Labs: 08/19/2017: ALT 31; BUN 14; Creat 1.03; Hemoglobin 16.4; Magnesium 2.0; Platelets 320; Potassium 4.1; Sodium 139; TSH 2.05   Lipid Panel    Component Value Date/Time   CHOL 201 (H) 08/19/2017 1006   TRIG 55 08/19/2017 1006   HDL 48 08/19/2017 1006   CHOLHDL 4.2 08/19/2017 1006   VLDL 18 01/13/2017 1445   LDLCALC 146 (H) 01/13/2017 1445    Additional studies/ records that were reviewed today  include: Summarized above.   ASSESSMENT & PLAN:    1. Palpitations -continue to have occasional palpitations.  These are certainly benign PVCs.  He has echocardiogram shows normal left ventricular systolic function.  2.  3. Essential HTN -blood pressure stable. 4.  5. Hyperlipidemia -continue to follow with his primary medical doctor 6.  7. Snoring/suspected OSA -lost 32 pounds.  Further recommendations per his medical doctor 8. .  Will see him in  1 year     Medication Adjustments/Labs and Tests Ordered: Current medicines are reviewed at length  with the patient today.  Concerns regarding medicines are outlined above. Medication changes, Labs and Tests ordered today are summarized above and listed in the Patient Instructions accessible in Encounters.   Signed, Mertie Moores, MD  09/15/2017 2:19 PM    [facility] [place], [place], [place]  [address] Phone: [phone]; Fax: [phone]

## 2017-09-29 ENCOUNTER — Ambulatory Visit (HOSPITAL_BASED_OUTPATIENT_CLINIC_OR_DEPARTMENT_OTHER): Payer: 59 | Attending: Physician Assistant | Admitting: Cardiology

## 2017-09-29 DIAGNOSIS — R0683 Snoring: Secondary | ICD-10-CM

## 2017-09-29 DIAGNOSIS — R002 Palpitations: Secondary | ICD-10-CM

## 2017-09-29 DIAGNOSIS — I493 Ventricular premature depolarization: Secondary | ICD-10-CM | POA: Insufficient documentation

## 2017-09-29 DIAGNOSIS — G4733 Obstructive sleep apnea (adult) (pediatric): Secondary | ICD-10-CM | POA: Diagnosis not present

## 2017-10-06 DIAGNOSIS — C44519 Basal cell carcinoma of skin of other part of trunk: Secondary | ICD-10-CM | POA: Diagnosis not present

## 2017-10-06 DIAGNOSIS — C44619 Basal cell carcinoma of skin of left upper limb, including shoulder: Secondary | ICD-10-CM | POA: Diagnosis not present

## 2017-10-06 DIAGNOSIS — L821 Other seborrheic keratosis: Secondary | ICD-10-CM | POA: Diagnosis not present

## 2017-10-06 DIAGNOSIS — D229 Melanocytic nevi, unspecified: Secondary | ICD-10-CM | POA: Diagnosis not present

## 2017-10-06 DIAGNOSIS — L814 Other melanin hyperpigmentation: Secondary | ICD-10-CM | POA: Diagnosis not present

## 2017-10-26 NOTE — Procedures (Signed)
   Patient Name: David Fuller, David Fuller Date: 09/29/2017 Gender: Male D.O.B: Oct 21, 1961 Age (years): 56 Referring Provider: [name] Height (inches): 73 Interpreting Physician: Fransico Him MD, ABSM Weight (lbs): 246 RPSGT: Madelon Lips BMI: 32 MRN: [id number] Neck Size: 17.00  CLINICAL INFORMATION Sleep Study Type: NPSG  Indication for sleep study: Snoring  SLEEP STUDY TECHNIQUE As per the AASM Manual for the Scoring of Sleep and Associated Events v2.3 (April 2016) with a hypopnea requiring 4% desaturations.  The channels recorded and monitored were frontal, central and occipital EEG, electrooculogram (EOG), submentalis EMG (chin), nasal and oral airflow, thoracic and abdominal wall motion, anterior tibialis EMG, snore microphone, electrocardiogram, and pulse oximetry.  MEDICATIONS Medications self-administered by patient taken the night of the study : N/A  SLEEP ARCHITECTURE The study was initiated at 9:51:41 PM and ended at 4:13:12 AM.  Sleep onset time was 89.3 minutes and the sleep efficiency was 51.2%. The total sleep time was 195.5 minutes.  Stage REM latency was 115.5 minutes.  The patient spent 23.53% of the night in stage N1 sleep, 62.66% in stage N2 sleep, 0.00% in stage N3 and 13.81% in REM.  Alpha intrusion was absent.  Supine sleep was 11.51%.  RESPIRATORY PARAMETERS The overall apnea/hypopnea index (AHI) was 23.0 per hour. There were 11 total apneas, including 9 obstructive, 2 central and 0 mixed apneas. There were 64 hypopneas and 18 RERAs.  The AHI during Stage REM sleep was 51.1 per hour.  AHI while supine was 56.0 per hour.  The mean oxygen saturation was 94.18%. The minimum SpO2 during sleep was 86.00%.  loud snoring was noted during this study.  CARDIAC DATA The 2 lead EKG demonstrated sinus rhythm. The mean heart rate was 61.83 beats per minute. Other EKG findings include: PVCs.  LEG MOVEMENT DATA The total PLMS were 0 with a  resulting PLMS index of 0.00. Associated arousal with leg movement index was 0.0 .  IMPRESSIONS - Moderate obstructive sleep apnea occurred during this study (AHI = 23.0/h). - No significant central sleep apnea occurred during this study (CAI = 0.6/h). - Mild oxygen desaturation was noted during this study (Min O2 = 86.00%). - The patient snored with loud snoring volume. - EKG findings include PVCs. - Clinically significant periodic limb movements did not occur during sleep. No significant associated arousals.  DIAGNOSIS - Obstructive Sleep Apnea (327.23 [G47.33 ICD-10])  RECOMMENDATIONS - Therapeutic CPAP titration to determine optimal pressure required to alleviate sleep disordered breathing. - Positional therapy avoiding supine position during sleep. - Avoid alcohol, sedatives and other CNS depressants that may worsen sleep apnea and disru  [place], American Board of Sleep Medicine  ELECTRONICALLY SIGNED ON:  10/26/2017, 8:56 PM [place] PH: [phone]   FX: [phone] [place]

## 2017-10-27 NOTE — Progress Notes (Signed)
Please make sure pt has f/u with Dr. Radford Pax to establish care for sleep apnea. Jakeline Dave PA-C

## 2017-10-29 ENCOUNTER — Telehealth: Payer: Self-pay | Admitting: *Deleted

## 2017-10-29 DIAGNOSIS — G4733 Obstructive sleep apnea (adult) (pediatric): Secondary | ICD-10-CM

## 2017-10-29 NOTE — Telephone Encounter (Signed)
IMPRESSIONS - Moderate obstructive sleep apnea occurred during this study (AHI = 23.0/h). - No significant central sleep apnea occurred during this study (CAI = 0.6/h). - Mild oxygen desaturation was noted during this study (Min O2 = 86.00%).   RECOMMENDATIONS - Therapeutic CPAP titration to determine optimal pressure required to alleviate sleep disordered breathing. - Positional therapy avoiding supine position during sleep. - Avoid alcohol, sedatives and other CNS depressants that may worsen sleep apnea and disru  Traci Turner Diplomate, American Board of Sleep Medicine

## 2017-10-29 NOTE — Telephone Encounter (Signed)
Message  Received: 2 days ago  Message Contents  Jeanann Lewandowsky, RMA  Freada Bergeron, CMA        Pt had his sleep study 09/29/17, per [name], pt needs a f/u appt with Dr. Radford Pax to establish care.   Thanks!

## 2017-10-29 NOTE — Telephone Encounter (Signed)
Return call: Patient called back to say he needs to think about his decision but to make him the appointment at the lab for late January or early February.

## 2017-10-29 NOTE — Telephone Encounter (Signed)
Informed patient of sleep study results and patient understanding was verbalized. Patient understands he stopped breathing 23/hr. Patient understands Dr Radford Pax has recommended a CPAP Titration to treat his sleep apnea. Patient understands he will need to return back to the sleep lab for one more night but patient does not want to go back to the lab. Patient states he was not able to sleep at the sleep lab. Patient does not think he will be able to wear a CPAP. Patient would like to know what other options he might have instead or getting a CPAP. Patient would like to have an appointment with Dr Radford Pax to discuss his options.

## 2017-10-29 NOTE — Telephone Encounter (Signed)
I would like him to try the CPAP first.  Please place an order for Lunesta 2mg  (#1 tablet with no refills) - he should take this at sleep lab when he goes to bed

## 2017-10-29 NOTE — Telephone Encounter (Signed)
LMTCB on [phone].

## 2017-10-29 NOTE — Telephone Encounter (Signed)
Left message to call back  

## 2017-10-30 MED ORDER — ESZOPICLONE 2 MG PO TABS: 2.0000 mg | ORAL_TABLET | Freq: Once | ORAL | 0 refills | Status: DC

## 2017-10-30 NOTE — Telephone Encounter (Signed)
Called patient and left message that lunesta 2 mg has been sent to pharmacy to be taken on the day of sleep study.   Per [name], she will schedule patient for sleep study but patient stated he would like time to think about having another test done.

## 2017-11-08 DIAGNOSIS — K045 Chronic apical periodontitis: Secondary | ICD-10-CM | POA: Diagnosis not present

## 2017-11-08 DIAGNOSIS — T180XXA Foreign body in mouth, initial encounter: Secondary | ICD-10-CM | POA: Diagnosis not present

## 2017-11-09 ENCOUNTER — Other Ambulatory Visit: Payer: Self-pay | Admitting: Physician Assistant

## 2017-11-09 DIAGNOSIS — F419 Anxiety disorder, unspecified: Secondary | ICD-10-CM

## 2017-11-23 DIAGNOSIS — Z85828 Personal history of other malignant neoplasm of skin: Secondary | ICD-10-CM | POA: Diagnosis not present

## 2017-11-23 DIAGNOSIS — L905 Scar conditions and fibrosis of skin: Secondary | ICD-10-CM | POA: Diagnosis not present

## 2017-11-25 NOTE — Telephone Encounter (Signed)
Split night study ordered 

## 2017-11-28 DIAGNOSIS — J9801 Acute bronchospasm: Secondary | ICD-10-CM | POA: Diagnosis not present

## 2017-11-28 DIAGNOSIS — R0602 Shortness of breath: Secondary | ICD-10-CM | POA: Diagnosis not present

## 2017-11-28 DIAGNOSIS — J45909 Unspecified asthma, uncomplicated: Secondary | ICD-10-CM | POA: Diagnosis not present

## 2017-12-09 ENCOUNTER — Telehealth: Payer: Self-pay | Admitting: Cardiology

## 2017-12-09 NOTE — Telephone Encounter (Signed)
New message     *STAT* If patient is at the pharmacy, call can be transferred to refill team.   1. Which medications need to be refilled? (please list name of each medication and dose if known) eszopiclone (LUNESTA) 2 MG TABS tablet 2. Which pharmacy/location (including street and city if local pharmacy) is medication to be sent to? [facility]  3. Do they need a 30 day or 90 day supply? For sleep study

## 2017-12-14 ENCOUNTER — Ambulatory Visit (HOSPITAL_BASED_OUTPATIENT_CLINIC_OR_DEPARTMENT_OTHER): Payer: 59 | Attending: Cardiology | Admitting: Cardiology

## 2017-12-14 VITALS — Ht 73.0 in | Wt 246.0 lb

## 2017-12-14 DIAGNOSIS — R0683 Snoring: Secondary | ICD-10-CM | POA: Insufficient documentation

## 2017-12-14 DIAGNOSIS — Z6832 Body mass index (BMI) 32.0-32.9, adult: Secondary | ICD-10-CM | POA: Diagnosis not present

## 2017-12-14 DIAGNOSIS — G4733 Obstructive sleep apnea (adult) (pediatric): Secondary | ICD-10-CM | POA: Diagnosis not present

## 2017-12-14 NOTE — Telephone Encounter (Signed)
Follow up   Patient is calling back about medication being sent to pharmacy. Please call to discuss.

## 2017-12-15 NOTE — Telephone Encounter (Signed)
Patient informed that David Fuller order was to picked up at the office prior to sleep study. Order had to be signed by MD, unable to send electrically. Patient stated he had his sleep study last night. I apologized to patient for the confusion. Informed patient that he would be contacted by [name] once the results are back. Patient verbalized understanding and thanked me for the call.

## 2017-12-16 NOTE — Procedures (Signed)
NAME: David Fuller DATE OF BIRTH:  01-27-1961 MEDICAL RECORD NUMBER [id number]  LOCATION: [facility]  PHYSICIAN: Traci Turner  DATE OF STUDY: 12/14/2017  SLEEP STUDY TYPE: Positive Airway Pressure Titration               REFERRING PHYSICIAN: Sueanne Margarita, MD   Gender: Male D.O.B: 12-23-1960 Age (years): 42 Referring Provider: Fransico Him MD, ABSM Height (inches): 73 Interpreting Physician: Fransico Him MD, ABSM Weight (lbs): 246 RPSGT: Jonna Coup BMI: 32 MRN: [id number] Neck Size: 17.00  CLINICAL INFORMATION The patient is referred for a BiPAP titration to treat sleep apnea.  SLEEP STUDY TECHNIQUE As per the AASM Manual for the Scoring of Sleep and Associated Events v2.3 (April 2016) with a hypopnea requiring 4% desaturations.  The channels recorded and monitored were frontal, central and occipital EEG, electrooculogram (EOG), submentalis EMG (chin), nasal and oral airflow, thoracic and abdominal wall motion, anterior tibialis EMG, snore microphone, electrocardiogram, and pulse oximetry. Bilevel positive airway pressure (BPAP) was initiated at the beginning of the study and titrated to treat sleep-disordered breathing.  MEDICATIONS Medications self-administered by patient taken the night of the study : N/A  RESPIRATORY PARAMETERS Optimal IPAP Pressure (cm): 17         AHI at Optimal Pressure (/hr) 0.0 Optimal EPAP Pressure (cm):13         Overall Minimal O2 (%):86.00             Minimal O2 at Optimal Pressure (%): 93.0  SLEEP ARCHITECTURE Start Time:9:33:59 PM             Stop Time:4:00:50 AM             Total Time (min):386.9            Total Sleep Time (min):324.0 Sleep Latency (min):16.7         Sleep Efficiency (%):83.8        REM Latency (min):163.0        WASO (min):   46.1 Stage N1 (%): 4.94      Stage N2 (%): 81.17    Stage N3 (%): 0.00      Stage R (%):13.89 Supine (%):54.28         Arousal Index (/hr):18.7             CARDIAC DATA The 2 lead EKG demonstrated sinus rhythm. The mean heart rate was 63.55 beats per minute. Other EKG findings include: None.  LEG MOVEMENT DATA The total Periodic Limb Movements of Sleep (PLMS) were 24. The PLMS index was 4.44. A PLMS index of <15 is considered normal in adults.  IMPRESSIONS - An optimal PAP pressure was selected for this patient ( 17/13 cm of water) - Central sleep apnea was not noted during this titration (CAI = 0.0/h). - Moderete oxygen desaturations were observed during this titration (min O2 = 86.00%). - The patient snored with moderate snoring volume. - No cardiac abnormalities were observed during this study. - Clinically significant periodic limb movements were not noted during this study. Arousals associated with PLMs were rare.  DIAGNOSIS - Obstructive Sleep Apnea (327.23 [G47.33 ICD-10])  RECOMMENDATIONS - Trial of BiPAP therapy on 17/13 cm H2O with a Medium size Fisher&Paykel Full Face Mask Simplus mask and heated humidification. - Avoid alcohol, sedatives and other CNS depressants that may worsen sleep apnea and disrupt normal sleep architecture. - Sleep hygiene should be reviewed to assess factors that may improve sleep quality. - Weight management and  regular exercise should be initiated or continued. - Return to Sleep Center for re-evaluation after 10 weeks of therapy  [place], [place] of Sleep Medicine  ELECTRONICALLY SIGNED ON:  12/16/2017, 8:51 PM [place] PH: [phone]   FX: [phone] [place]

## 2017-12-16 NOTE — Progress Notes (Signed)
   NAME: David Fuller DATE OF BIRTH:  1961-01-30 MEDICAL RECORD NUMBER [id number]  LOCATION: [facility]  PHYSICIAN: Traci Turner  DATE OF STUDY: 12/14/2017  SLEEP STUDY TYPE: Positive Airway Pressure Titration               REFERRING PHYSICIAN: Sueanne Margarita, MD   Gender: Male D.O.B: [birth year] Age (years): 18 Referring Provider: Fransico Him MD, ABSM Height (inches): 73 Interpreting Physician: Fransico Him MD, ABSM Weight (lbs): 246 RPSGT: Jonna Coup BMI: 32 MRN: [id number] Neck Size: 17.00  CLINICAL INFORMATION The patient is referred for a BiPAP titration to treat sleep apnea.  SLEEP STUDY TECHNIQUE As per the AASM Manual for the Scoring of Sleep and Associated Events v2.3 (April 2016) with a hypopnea requiring 4% desaturations.  The channels recorded and monitored were frontal, central and occipital EEG, electrooculogram (EOG), submentalis EMG (chin), nasal and oral airflow, thoracic and abdominal wall motion, anterior tibialis EMG, snore microphone, electrocardiogram, and pulse oximetry. Bilevel positive airway pressure (BPAP) was initiated at the beginning of the study and titrated to treat sleep-disordered breathing.  MEDICATIONS Medications self-administered by patient taken the night of the study : N/A  RESPIRATORY PARAMETERS Optimal IPAP Pressure (cm): 17  AHI at Optimal Pressure (/hr) 0.0 Optimal EPAP Pressure (cm):13  Overall Minimal O2 (%):86.00  Minimal O2 at Optimal Pressure (%): 93.0  SLEEP ARCHITECTURE Start Time:9:33:59 PM  Stop Time:4:00:50 AM  Total Time (min):386.9  Total Sleep Time (min):324.0 Sleep Latency (min):16.7  Sleep Efficiency (%):83.8  REM Latency (min):163.0  WASO (min): 46.1 Stage N1 (%): 4.94  Stage N2 (%): 81.17  Stage N3 (%): 0.00  Stage R (%):13.89 Supine (%):54.28  Arousal Index (/hr):18.7   CARDIAC DATA The 2 lead EKG demonstrated sinus rhythm. The mean heart rate was 63.55 beats per  minute. Other EKG findings include: None.  LEG MOVEMENT DATA The total Periodic Limb Movements of Sleep (PLMS) were 24. The PLMS index was 4.44. A PLMS index of <15 is considered normal in adults.  IMPRESSIONS - An optimal PAP pressure was selected for this patient ( 17/13 cm of water) - Central sleep apnea was not noted during this titration (CAI = 0.0/h). - Moderete oxygen desaturations were observed during this titration (min O2 = 86.00%). - The patient snored with moderate snoring volume. - No cardiac abnormalities were observed during this study. - Clinically significant periodic limb movements were not noted during this study. Arousals associated with PLMs were rare.  DIAGNOSIS - Obstructive Sleep Apnea (327.23 [G47.33 ICD-10])  RECOMMENDATIONS - Trial of BiPAP therapy on 17/13 cm H2O with a Medium size Fisher[employer]Paykel Full Face Mask Simplus mask and heated humidification. - Avoid alcohol, sedatives and other CNS depressants that may worsen sleep apnea and disrupt normal sleep architecture. - Sleep hygiene should be reviewed to assess factors that may improve sleep quality. - Weight management and regular exercise should be initiated or continued. - Return to Sleep Center for re-evaluation after 10 weeks of therapy  [place]  ELECTRONICALLY SIGNED ON:  12/16/2017, 8:51 PM [place] PH: [phone]   FX: [phone] [place]

## 2017-12-21 ENCOUNTER — Ambulatory Visit: Payer: 59 | Admitting: Adult Health

## 2017-12-21 ENCOUNTER — Encounter: Payer: Self-pay | Admitting: Adult Health

## 2017-12-21 VITALS — BP 122/80 | HR 76 | Temp 97.5°F | Ht 73.0 in | Wt 251.0 lb

## 2017-12-21 DIAGNOSIS — J00 Acute nasopharyngitis [common cold]: Secondary | ICD-10-CM | POA: Diagnosis not present

## 2017-12-21 DIAGNOSIS — F419 Anxiety disorder, unspecified: Secondary | ICD-10-CM | POA: Diagnosis not present

## 2017-12-21 MED ORDER — ALPRAZOLAM 0.5 MG PO TABS: ORAL_TABLET | ORAL | 0 refills | Status: DC

## 2017-12-21 MED ORDER — AZITHROMYCIN 250 MG PO TABS: ORAL_TABLET | ORAL | 1 refills | Status: AC

## 2017-12-21 MED ORDER — PROMETHAZINE-DM 6.25-15 MG/5ML PO SYRP: 5.0000 mL | ORAL_SOLUTION | Freq: Four times a day (QID) | ORAL | 1 refills | Status: DC | PRN

## 2017-12-21 MED ORDER — PREDNISONE 20 MG PO TABS: ORAL_TABLET | ORAL | 0 refills | Status: DC

## 2017-12-21 NOTE — Patient Instructions (Addendum)
Nasal saline flushes, mucinex/guaifenesin, flonase daily, rotate allergy medication  Take antibiotic only if not improving in 3-4 days, or if you develop fever or productive cough is progressing     HOW TO TREAT VIRAL COUGH AND COLD SYMPTOMS:  -Symptoms usually last at least 1 week with the worst symptoms being around day 4.  - colds usually start with a sore throat and end with a cough, and the cough can take 2 weeks to get better.  -No antibiotics are needed for colds, flu, sore throats, cough, bronchitis UNLESS symptoms are longer than 7 days OR if you are getting better then get drastically worse.  -There are a lot of combination medications (Dayquil, Nyquil, Vicks 44, tyelnol cold and sinus, ETC). Please look at the ingredients on the back so that you are treating the correct symptoms and not doubling up on medications/ingredients.    Medicines you can use  Nasal congestion  Little Remedies saline spray (aerosol/mist)- can try this, it is in the kids section - pseudoephedrine (Sudafed)- behind the counter, do not use if you have high blood pressure, medicine that have -D in them.  - phenylephrine (Sudafed PE) -Dextormethorphan + chlorpheniramine (Coridcidin HBP)- okay if you have high blood pressure -Oxymetazoline (Afrin) nasal spray- LIMIT to 3 days -Saline nasal spray -Neti pot (used distilled or bottled water)  Ear pain/congestion  -pseudoephedrine (sudafed) - Nasonex/flonase nasal spray  Fever  -Acetaminophen (Tyelnol) -Ibuprofen (Advil, motrin, aleve)  Sore Throat  -Acetaminophen (Tyelnol) -Ibuprofen (Advil, motrin, aleve) -Drink a lot of water -Gargle with salt water - Rest your voice (don't talk) -Throat sprays -Cough drops  Body Aches  -Acetaminophen (Tyelnol) -Ibuprofen (Advil, motrin, aleve)  Headache  -Acetaminophen (Tyelnol) -Ibuprofen (Advil, motrin, aleve) - Exedrin, Exedrin Migraine  Allergy symptoms (cough, sneeze, runny nose, itchy  eyes) -Claritin or loratadine cheapest but likely the weakest  -Zyrtec or certizine at night because it can make you sleepy -The strongest is allegra or fexafinadine  Cheapest at [employer], [employer], [employer]  Cough  -Dextromethorphan (Delsym)- medicine that has DM in it -Guafenesin (Mucinex/Robitussin) - cough drops - drink lots of water  Chest Congestion  -Guafenesin (Mucinex/Robitussin)  Red Itchy Eyes  - Naphcon-A  Upset Stomach  - Bland diet (nothing spicy, greasy, fried, and high acid foods like tomatoes, oranges, berries) -OKAY- cereal, bread, soup, crackers, rice -Eat smaller more frequent meals -reduce caffeine, no alcohol -Loperamide (Imodium-AD) if diarrhea -Prevacid for heart burn  General health when sick  -Hydration -wash your hands frequently -keep surfaces clean -change pillow cases and sheets often -Get fresh air but do not exercise strenuously -Vitamin D, double up on it - Vitamin C -Zinc

## 2017-12-21 NOTE — Progress Notes (Signed)
Assessment and Plan:  David Fuller was seen today for sinus problem and sore throat.  Diagnoses and all orders for this visit:  Acute nasopharyngitis (common cold) Benign exam- Discussed the importance of avoiding unnecessary antibiotic therapy. Suggested symptomatic OTC remedies. Nasal saline spray for congestion. Nasal steroids, allergy pill, oral steroids Follow up as needed -     predniSONE (DELTASONE) 20 MG tablet; 2 tablets daily for 3 days, 1 tablet daily for 4 days. -     promethazine-dextromethorphan (PROMETHAZINE-DM) 6.25-15 MG/5ML syrup; Take 5 mLs by mouth 4 (four) times daily as needed for cough.  Fill only if symptoms not improving or develops fever -     azithromycin (ZITHROMAX) 250 MG tablet; Take 2 tablets (500 mg) on  Day 1,  followed by 1 tablet (250 mg) once daily on Days 2 through 5.  Anxiety Uses PRN only for sleep 2-3 times weekly; discussed sleep hygiene, alternate agents -     ALPRAZolam (XANAX) 0.5 MG tablet; TAKE 1 TABLET AT BEDTIME AS NEEDED FOR ANXIETY OR SLEEP   Further disposition pending results of labs. Discussed med's effects and SE's.   Over 15 minutes of exam, counseling, chart review, and critical decision making was performed.   Future Appointments  Date Time Provider [place]  01/14/2018  2:00 PM Vicie Mutters, PA-C [facility] None    ------------------------------------------------------------------------------------------------------------------   HPI BP 122/80   Pulse 76   Temp (!) 97.5 F (36.4 C)   Ht 6\' 1"  (1.854 m)   Wt 251 lb (113.9 kg)   SpO2 97%   BMI 33.12 kg/m   57 y.o.male presents for sore throat, nasal congestion (thick green/yellow), mild headaches, productive cough. Denies CP, tightness, dyspnea, fever/chills, diaphoresis, nausea/vomiting, rashes. Denies changes in vision, weakness, balance problems. He does take singlulaire daily, flonase as needed, takes loratadine daily.   He went to urgent care 2 weeks ago  for URI - was given prednisone taper and was feeling better. He had sleep study done 1 week ago and had severe sore throat which progressed to current symptoms. He has mild intermittent "asthma" which has improved significantly with weight loss. Has PRN inhaler which he has not needed.   Past Medical History:  Diagnosis Date  . Allergy   . Asthma   . Diverticulitis   . Essential hypertension   . Hemorrhoid   . Hyperlipidemia   . Kidney stones   . Mild dilation of ascending aorta (HCC)   . Obesity   . Obstructive sleep apnea 10/16/2010   Qualifier: Diagnosis of  By: Gwenette Greet MD, Armando Reichert  Not on CPAP    . Seasonal allergies 08/24/2013  . Swollen lymph nodes    abd, groin,      Allergies  Allergen Reactions  . Shellfish Allergy Nausea Only  . Iodine Other (See Comments)    Unknown    Current Outpatient Medications on File Prior to Visit  Medication Sig  . albuterol (PROVENTIL HFA;VENTOLIN HFA) 108 (90 Base) MCG/ACT inhaler Inhale 1 puff into the lungs every 6 (six) hours as needed for wheezing or shortness of breath.  . ALPRAZolam (XANAX) 0.5 MG tablet TAKE 1 TABLET AT BEDTIME AS NEEDED FOR ANXIETY OR SLEEP  . Cholecalciferol (VITAMIN D3 PO) Take 1 capsule by mouth daily.  . fluticasone (FLONASE) 50 MCG/ACT nasal spray Place 2 sprays into both nostrils as needed for allergies.  . metoprolol tartrate (LOPRESSOR) 50 MG tablet Take 0.5 tablets (25 mg total) 2 (two) times daily by mouth.  [name] Kitchen  montelukast (SINGULAIR) 10 MG tablet Take 1 tablet (10 mg total) by mouth at bedtime.  . eszopiclone (LUNESTA) 2 MG TABS tablet Take 1 tablet (2 mg total) by mouth once for 1 dose. Please take at sleep lab (Patient not taking: Reported on 12/21/2017)   No current facility-administered medications on file prior to visit.     ROS: all negative except above.   Physical Exam:  BP 122/80   Pulse 76   Temp (!) 97.5 F (36.4 C)   Ht 6\' 1"  (1.854 m)   Wt 251 lb (113.9 kg)   SpO2 97%   BMI 33.12  kg/m   General Appearance: Well nourished, in no apparent distress. Eyes: PERRLA, EOMs, conjunctiva no swelling or erythema Sinuses: No Frontal/maxillary tenderness ENT/Mouth: Ext aud canals clear, TMs without erythema, bulging. No erythema, swelling, or exudate on post pharynx.  Tonsils not swollen or erythematous. Hearing normal.  Neck: Supple, thyroid normal.  Respiratory: Respiratory effort normal, BS equal bilaterally without rales, rhonchi, wheezing or stridor.  Cardio: RRR with no MRGs. Brisk peripheral pulses without edema.  Abdomen: Soft, + BS.  Non tender, no guarding. Lymphatics: Non tender without lymphadenopathy.  Musculoskeletal: Symmetrical strength, normal gait.  Skin: Warm, dry without rashes, lesions, ecchymosis.  Psych: Awake and oriented X 3, normal affect, Insight and Judgment appropriate.     Izora Ribas, NP 11:22 AM [facility]

## 2017-12-25 ENCOUNTER — Telehealth: Payer: Self-pay | Admitting: *Deleted

## 2017-12-25 DIAGNOSIS — K602 Anal fissure, unspecified: Secondary | ICD-10-CM | POA: Insufficient documentation

## 2017-12-25 DIAGNOSIS — G4733 Obstructive sleep apnea (adult) (pediatric): Secondary | ICD-10-CM

## 2017-12-25 NOTE — Telephone Encounter (Addendum)
Informed patient of sleep study results and patient understanding was verbalized. Patient understands he had a successful PAP titration and will be set up with a CPAP. Patient does not want a CPAP because he had a very sore throat the next morning from being so dry from the CPAP.  Patient states he would really like to meet and talk with Dr Radford Pax to discuss his other options before moving forward. He would like to discuss the possibility of an oral device. He was grateful for the call and thanked me.

## 2017-12-25 NOTE — Telephone Encounter (Signed)
-----   Message from Sueanne Margarita, MD sent at 12/16/2017  8:57 PM EST ----- Please let patient know that they had a successful PAP titration and let DME know that orders are in EPIC.  Please set up 10 week OV with me.

## 2017-12-26 NOTE — Telephone Encounter (Signed)
Please let patient know that given the severity of his OSA, an oral device is not an option.  The BiPAP is standard treatment for his degree of apnea

## 2017-12-28 NOTE — Telephone Encounter (Signed)
I am happy to schedule an OV to discuss this if patient would like

## 2017-12-28 NOTE — Telephone Encounter (Signed)
Informed the patient that Dr Theodosia Blender advice to him is.. "given the severity of his OSA, an oral device is not an option and that the BiPAP is standard treatment for his degree of apnea. The patient stated that he understood but he does not understand why Dr Radford Pax will not agree to sit down and talk to him in an office visit until he gets a CPAP. Patient has decided not to move forward with getting his CPAP. Patient thanked me for calling.

## 2017-12-29 NOTE — Telephone Encounter (Signed)
Patient is happy to come and he is scheduled for April 3 at 2 pm.

## 2018-01-12 ENCOUNTER — Encounter: Payer: Self-pay | Admitting: Physician Assistant

## 2018-01-12 NOTE — Progress Notes (Signed)
Complete Physical  Assessment and Plan: Asthma, unspecified asthma severity, uncomplicated Better with singulair, albuterol, but has cleaned out humidifer from house and doing better.   Hyperlipidemia -continue medications, check lipids, decrease fatty foods, increase activity.  - Lipid panel - EKG 12-Lead  Obstructive sleep apnea Weight loss advised Suggest follow up and get on BiPAP  Seasonal allergies Continue medications  Encounter for general adult medical examination with abnormal findings   Essential hypertension - continue medications, DASH diet, exercise and monitor at home. Call if greater than 130/80.  - CBC with Differential/Platelet - BASIC METABOLIC PANEL WITH GFR - TSH - Hepatic function panel  Morbid Obesity Obesity with co morbidities- long discussion about weight loss, diet, and exercise   History of nephrolithiasis Increase fluids   Diverticulitis of intestine without perforation or abscess without bleeding Continue fiber  Prediabetes Discussed general issues about diabetes pathophysiology and management., Educational material distributed., Suggested low cholesterol diet., Encouraged aerobic exercise., Discussed foot care., Reminded to get yearly retinal exam. - Insulin, fasting   Medication management - Magnesium  Vitamin D deficiency - Vit D  25 hydroxy (rtn osteoporosis monitoring)  Screening for prostate cancer - PSA  . Discussed med's effects and SE's. Screening labs and tests as requested with regular follow-up as recommended. Future Appointments  Date Time Provider [place]  02/10/2018  2:00 PM Sueanne Margarita, MD [facility]  01/19/2019 10:00 AM Vicie Mutters, PA-C [facility] None    HPI Patient presents for a complete physical.    His blood pressure has been controlled at home, today their BP is BP: 114/80 He does workout, on tread climber. He denies chest pain, shortness of breath, dizziness.  He is not  on cholesterol medication and denies myalgias. His cholesterol is at goal. The cholesterol last visit was:   Lab Results  Component Value Date   CHOL 201 (H) 08/19/2017   HDL 48 08/19/2017   LDLCALC 146 (H) 01/13/2017   TRIG 55 08/19/2017   CHOLHDL 4.2 08/19/2017   Last A1C in the office was:  Lab Results  Component Value Date   HGBA1C 5.6 09/17/2015   Patient is on Vitamin D supplement- has been on treatment.   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  Mom passed 2017 BMI is Body mass index is 33.12 kg/m., he is working on diet and exercise, now that he is retired he is increasing exercise- joined gym at [employer], increasing water, and going to decrease portions.  He has + sleep apnea, suppose to be on biPAP optimal pressure 17/13. However he states that he felt that his throat was very raw when he woke up that morning, it was heated humidifcaiton.  Wt Readings from Last 3 Encounters:  01/14/18 251 lb (113.9 kg)  12/21/17 251 lb (113.9 kg)  12/15/17 246 lb (111.6 kg)   He has asthma/allergies and is on allegra, and has albuterol which helps.   Current Medications:  Current Outpatient Medications on File Prior to Visit  Medication Sig Dispense Refill  . albuterol (PROVENTIL HFA;VENTOLIN HFA) 108 (90 Base) MCG/ACT inhaler Inhale 1 puff into the lungs every 6 (six) hours as needed for wheezing or shortness of  breath.    . Cholecalciferol (VITAMIN D3 PO) Take 6,000 Units by mouth daily.     . fluticasone (FLONASE) 50 MCG/ACT nasal spray Place 2 sprays into both nostrils as needed for allergies. 16 g 3  . metoprolol tartrate (LOPRESSOR) 50 MG tablet Take 0.5 tablets (25 mg total) 2 (two) times daily by mouth. 90 tablet 3  . ALPRAZolam (XANAX)  0.5 MG tablet TAKE 1 TABLET AT BEDTIME AS NEEDED FOR ANXIETY OR SLEEP (Patient not taking: Reported on 01/14/2018) 30 tablet 0  . eszopiclone (LUNESTA) 2 MG TABS tablet Take 1 tablet (2 mg total) by mouth once for 1 dose. Please take at sleep lab (Patient not taking: Reported on 12/21/2017) 1 tablet 0  . montelukast (SINGULAIR) 10 MG tablet Take 1 tablet (10 mg total) by mouth at bedtime. (Patient not taking: Reported on 01/14/2018) 90 tablet 2  . predniSONE (DELTASONE) 20 MG tablet 2 tablets daily for 3 days, 1 tablet daily for 4 days. (Patient not taking: Reported on 01/14/2018) 10 tablet 0  . promethazine-dextromethorphan (PROMETHAZINE-DM) 6.25-15 MG/5ML syrup Take 5 mLs by mouth 4 (four) times daily as needed for cough. (Patient not taking: Reported on 01/14/2018) 240 mL 1   No current facility-administered medications on file prior to visit.    Health Maintenance:  Immunization History  Administered Date(s) Administered  . DTaP 08/25/2011  . Influenza Inj Mdck Quad With Preservative 08/19/2017  . Influenza Whole 08/29/2013   Tetanus: 2012 Pneumovax: N/A Prevnar 13: N/A Flu vaccine:2017 Zostavax: N/A DEXA: N/A Colonoscopy: 08/2016 due 10 years EGD: N/A CXR 2016  Patient Care Team: [name], MD as PCP - General (Internal Medicine) Druscilla Brownie, MD as Consulting Physician (Dermatology) Richmond Campbell, MD as Consulting Physician (Gastroenterology) Monna Fam, MD as Consulting Physician (Ophthalmology)   Medical History:  Past Medical History:  Diagnosis Date  . Allergy   . Asthma   . Diverticulitis   . Diverticulitis 09/17/2015  . Essential hypertension   . Hemorrhoid   . Hyperlipidemia   . Kidney stones   . Mild dilation of ascending aorta (HCC)   . Obesity   . Obstructive sleep apnea 10/16/2010   Qualifier: Diagnosis of  By: Gwenette Greet MD, Armando Reichert  Not on CPAP    . Seasonal allergies 08/24/2013  . Swollen lymph nodes    abd, groin,    Allergies Allergies   Allergen Reactions  . Shellfish Allergy Nausea Only  . Iodine Other (See Comments)    Unknown    SURGICAL HISTORY He  has a past surgical history that includes Vasectomy (MID 90S). FAMILY HISTORY His family history includes Atrial fibrillation in his mother; Cancer in his brother; Diabetes in his father; Heart disease in his father; Heart failure in his father; Stroke in his mother. SOCIAL HISTORY He  reports that  has never smoked. He has quit using smokeless tobacco. His smokeless tobacco use included chew. He reports that he drinks alcohol. He reports that he does not use drugs.  Review of Systems  Constitutional: Negative.   HENT: Negative.   Respiratory: Negative.   Cardiovascular: Negative.   Gastrointestinal: Negative.   Genitourinary: Negative.   Musculoskeletal: Negative.   Skin: Negative.   Neurological: Negative.   Psychiatric/Behavioral: Negative.     Physical Exam: Estimated body mass index is 33.12 kg/m as calculated from the following:   Height as of this encounter: 6\' 1"  (1.854 m).   Weight as of this encounter: 251 lb (113.9 kg). BP 114/80  Pulse 85   Temp 97.9 F (36.6 C)   Ht 6\' 1"  (1.854 m)   Wt 251 lb (113.9 kg)   SpO2 98%   BMI 33.12 kg/m  General Appearance: Well nourished, in no apparent distress.  Eyes: PERRLA, EOMs, conjunctiva no swelling or erythema, normal fundi and vessels.  Sinuses: No Frontal/maxillary tenderness  ENT/Mouth: Ext aud canals clear, normal light reflex with TMs without erythema, bulging. Good dentition. No erythema, swelling, or exudate on post pharynx. Tonsils not swollen or erythematous. Hearing normal.  Neck: Supple, thyroid normal. No bruits  Respiratory: Respiratory effort normal, BS equal bilaterally without rales, rhonchi, wheezing or stridor.  Cardio: RRR without murmurs, rubs or gallops. Brisk peripheral pulses without edema.  Chest: symmetric, with normal excursions and percussion.  Abdomen: Soft, nontender,  obese,  no guarding, rebound, hernias, masses, or organomegaly.  Lymphatics: Non tender without lymphadenopathy.  Genitourinary: defer Musculoskeletal: Full ROM all peripheral extremities,5/5 strength, and normal gait.  Skin: Warm, dry without rashes, lesions, ecchymosis. Neuro: Cranial nerves intact, reflexes equal bilaterally. Normal muscle tone, no cerebellar symptoms. Sensation intact.  Psych: Awake and oriented X 3, normal affect, Insight and Judgment appropriate.   EKG: defer  Vicie Mutters 2:12 PM [facility]

## 2018-01-14 ENCOUNTER — Encounter: Payer: Self-pay | Admitting: Physician Assistant

## 2018-01-14 ENCOUNTER — Ambulatory Visit: Payer: 59 | Admitting: Physician Assistant

## 2018-01-14 VITALS — BP 114/80 | HR 85 | Temp 97.9°F | Ht 73.0 in | Wt 251.0 lb

## 2018-01-14 DIAGNOSIS — E785 Hyperlipidemia, unspecified: Secondary | ICD-10-CM | POA: Diagnosis not present

## 2018-01-14 DIAGNOSIS — J302 Other seasonal allergic rhinitis: Secondary | ICD-10-CM | POA: Diagnosis not present

## 2018-01-14 DIAGNOSIS — I10 Essential (primary) hypertension: Secondary | ICD-10-CM | POA: Diagnosis not present

## 2018-01-14 DIAGNOSIS — Z87442 Personal history of urinary calculi: Secondary | ICD-10-CM | POA: Diagnosis not present

## 2018-01-14 DIAGNOSIS — G4733 Obstructive sleep apnea (adult) (pediatric): Secondary | ICD-10-CM

## 2018-01-14 DIAGNOSIS — R7309 Other abnormal glucose: Secondary | ICD-10-CM

## 2018-01-14 DIAGNOSIS — I493 Ventricular premature depolarization: Secondary | ICD-10-CM | POA: Diagnosis not present

## 2018-01-14 DIAGNOSIS — E559 Vitamin D deficiency, unspecified: Secondary | ICD-10-CM | POA: Diagnosis not present

## 2018-01-14 DIAGNOSIS — Z130 Encounter for screening for diseases of the blood and blood-forming organs and certain disorders involving the immune mechanism: Secondary | ICD-10-CM

## 2018-01-14 DIAGNOSIS — Z79899 Other long term (current) drug therapy: Secondary | ICD-10-CM

## 2018-01-14 DIAGNOSIS — Z125 Encounter for screening for malignant neoplasm of prostate: Secondary | ICD-10-CM | POA: Diagnosis not present

## 2018-01-14 DIAGNOSIS — J45909 Unspecified asthma, uncomplicated: Secondary | ICD-10-CM

## 2018-01-14 DIAGNOSIS — Z0001 Encounter for general adult medical examination with abnormal findings: Secondary | ICD-10-CM

## 2018-01-14 NOTE — Patient Instructions (Addendum)
Read Mini Habits for weight loss  Veggies are great because you can eat a ton! They are low in calories, great to fill you up, and have a ton of vitamins, minerals, and protein.      8 Critical Weight-Loss Tips That Aren't Diet and Exercise  1. STARVE THE DISTRACTIONS  All too often when we eat, we're also multitasking: watching TV, answering emails, scrolling through social media. These habits are detrimental to having a strong, clear, healthy relationship with food, and they can hinder our ability to make dietary changes.  In order to truly focus on what you're eating, how much you're eating, why you're eating those specific foods and, most importantly, how those foods make you feel, you need to starve the distractions. That means when you eat, just eat. Focus on your food, the process it went through to end up on your plate, where it came from and how it nourishes you. With this technique, you're more likely to finish a meal feeling satiated.  2.  CONSIDER WHAT YOU'RE NOT WILLING TO DO  This might sound counterintuitive, but it can help provide a "why" when motivation is waning. Declare, in writing, what you are unwilling to do, for example "I am unwilling to be the old dad who cannot play sports with my children".  So consider what you're not willing to accept, write it down, and keep it at the ready.  3.  STOP LABELING FOOD "GOOD" AND "BAD"  You've probably heard someone say they ate something "bad." Maybe you've even said it yourself.  The trouble with 'bad' foods isn't that they'll send you to the grave after a bite or two. The trouble comes when we eat excessive portions of really calorie-dense foods meal after meal, day after day.  Instead of labeling foods as good or bad, think about which foods you can eat a lot of, and which ones you should just eat a little of. Then, plan ways to eat the foods you really like in portions that fit with your overall goals. A good example of  this would be having a slice of pizza alongside a club salad with chicken breast, avocado and a bit of dressing. This is vastly different than 3 slices of pizza, 4 breadsticks with cheese sauce and half of a liter of regular soda.  4.  BRUSH YOUR TEETH AFTER YOU EAT  Getting your mindset in order is important, but sometimes small habits can make a big difference. After eating, you still have the taste of food in their mouth, which often causes people to eat more even if they are full or engage in a nibble or two of dessert.  Brushing your teeth will remove the taste of food from your mouth, and the clean, minty freshness will serve as a cue that mealtime is over.  5.  FOCUS ON CROWDING NOT CUTTING  The most common first step during 'dieting' is to cut. We cut our portion sizes down, we cut out 'bad' foods, we cut out entire food groups. This act of cutting puts [place] and our minds into scarcity mode.  When something is off-limits, even if you're able to avoid it for a while, you could end up bingeing on it later because you've gone so long without it. So, instead of cutting, focus on crowding. If you crowd your plate and fill it up with more foods like veggies and protein, it simply allows less room for the other stuff. In other words, shift  your focus away from what you can't eat, and celebrate the foods that will help you reach your goals.  6.  TAKE TRACKING A STEP FURTHER  Track what you eat, when you ate it, how much you ate and how that food made you feel. Being completely honest with yourself and writing down every single thing that passes through your lips will help you start to notice that maybe you actually do snack, possibly take in more sugar than you thought, eat when you're bored rather than just hungry or maybe that you have a habit of snacking before bed while watching TV.  The difference from simply tracking your food intake is you're taking into account how food makes you feel, as well  as what you're doing while you're eating. This is about becoming more mindful of what, when and why you eat.  7.  PRIORITIZE GOOD SLEEP  One of the strongest risk factors for being overweight is poor sleep. When you're feeling tired, you're more likely to choose unhealthy comfort foods and to skip your workout. Additionally, sleep deprivation may slow down your metabolism. [employer]! Therefore, sleeping 7-8 hours per night can help with weight loss without having to change your diet or increase your physical activity. And if you feel you snore and still wake up tired, talk with me about sleep apnea.  8.  SET ASIDE TIME TO DISCONNECT  Just get out there. Disconnect from the electronics and connect to the elements. Not only will this help reduce stress (a major factor in weight gain) by giving your mind a break from the constant stimulation we've all become so accustomed to, but it may also reprogram your brain to connect with yourself and what you're feeling.   Dining out: help on how to choose  It is better if you can meal plan and eat at your house but sometimes life happens so here are some tips to help you make healthier choices while eating out:  1) Ask for your server to pack up 1/2 of your meal to take home for lunch or later. Portion sizes are huge so if you don't have all of it in front of you, you are less likely to eat it all.    2) Ask if they have a smaller portion or lunch portion available, this is often cheaper as well!  3) Ask to not have the bread basket brought to the table  4) Ask for the dressing on the side.   5) Look at the nutrition menu BEFORE you go to a restaurant or fast food chain and have what you will eat in mind. You can also look it up on food tracking ups like Myfitness Pal or Lost it. However the web site for the restaurant is usually the best bet.   6) Don't forget that you are the costumer, it is okay to ask them to leave things out, ask about substituting, or  ask them to cook with less oil!  Here is some more general information for you!

## 2018-01-15 LAB — HEPATIC FUNCTION PANEL
AG RATIO: 1.8 (calc) (ref 1.0–2.5)
ALKALINE PHOSPHATASE (APISO): 60 U/L (ref 40–115)
ALT: 21 U/L (ref 9–46)
AST: 21 U/L (ref 10–35)
Albumin: 4.2 g/dL (ref 3.6–5.1)
BILIRUBIN INDIRECT: 1 mg/dL (ref 0.2–1.2)
Bilirubin, Direct: 0.2 mg/dL (ref 0.0–0.2)
Globulin: 2.3 g/dL (calc) (ref 1.9–3.7)
Total Bilirubin: 1.2 mg/dL (ref 0.2–1.2)
Total Protein: 6.5 g/dL (ref 6.1–8.1)

## 2018-01-15 LAB — VITAMIN B12: VITAMIN B 12: 383 pg/mL (ref 200–1100)

## 2018-01-15 LAB — IRON, TOTAL/TOTAL IRON BINDING CAP
%SAT: 47 % (calc) (ref 15–60)
IRON: 162 ug/dL (ref 50–180)
TIBC: 343 mcg/dL (calc) (ref 250–425)

## 2018-01-15 LAB — LIPID PANEL
Cholesterol: 222 mg/dL — ABNORMAL HIGH (ref <200)
HDL: 44 mg/dL (ref >40)
LDL Cholesterol (Calc): 152 mg/dL (calc) — ABNORMAL HIGH
Non-HDL Cholesterol (Calc): 178 mg/dL (calc) — ABNORMAL HIGH (ref <130)
Total CHOL/HDL Ratio: 5 (calc) — ABNORMAL HIGH (ref <5.0)
Triglycerides: 133 mg/dL (ref <150)

## 2018-01-15 LAB — CBC WITH DIFFERENTIAL/PLATELET
BASOS ABS: 53 {cells}/uL (ref 0–200)
Basophils Relative: 0.6 %
EOS PCT: 1.9 %
Eosinophils Absolute: 167 cells/uL (ref 15–500)
HCT: 45.5 % (ref 38.5–50.0)
HEMOGLOBIN: 16.4 g/dL (ref 13.2–17.1)
Lymphs Abs: 1223 cells/uL (ref 850–3900)
MCH: 30.8 pg (ref 27.0–33.0)
MCHC: 36 g/dL (ref 32.0–36.0)
MCV: 85.4 fL (ref 80.0–100.0)
MONOS PCT: 8 %
MPV: 10 fL (ref 7.5–12.5)
NEUTROS ABS: 6653 {cells}/uL (ref 1500–7800)
Neutrophils Relative %: 75.6 %
PLATELETS: 267 10*3/uL (ref 140–400)
RBC: 5.33 10*6/uL (ref 4.20–5.80)
RDW: 13 % (ref 11.0–15.0)
Total Lymphocyte: 13.9 %
WBC mixed population: 704 cells/uL (ref 200–950)
WBC: 8.8 10*3/uL (ref 3.8–10.8)

## 2018-01-15 LAB — BASIC METABOLIC PANEL WITH GFR
BUN: 12 mg/dL (ref 7–25)
CO2: 26 mmol/L (ref 20–32)
CREATININE: 0.9 mg/dL (ref 0.70–1.33)
Calcium: 9.4 mg/dL (ref 8.6–10.3)
Chloride: 108 mmol/L (ref 98–110)
GFR, Est African American: 109 mL/min/{1.73_m2} (ref >=60)
GFR, Est Non African American: 94 mL/min/{1.73_m2} (ref >=60)
GLUCOSE: 105 mg/dL — AB (ref 65–99)
POTASSIUM: 4 mmol/L (ref 3.5–5.3)
SODIUM: 143 mmol/L (ref 135–146)

## 2018-01-15 LAB — MICROALBUMIN / CREATININE URINE RATIO
CREATININE, URINE: 245 mg/dL (ref 20–320)
MICROALB/CREAT RATIO: 3 ug/mg{creat} (ref <30)
Microalb, Ur: 0.8 mg/dL

## 2018-01-15 LAB — URINALYSIS, ROUTINE W REFLEX MICROSCOPIC
Bilirubin Urine: NEGATIVE
Glucose, UA: NEGATIVE
HGB URINE DIPSTICK: NEGATIVE
LEUKOCYTES UA: NEGATIVE
Nitrite: NEGATIVE
PROTEIN: NEGATIVE
Specific Gravity, Urine: 1.028 (ref 1.001–1.03)
pH: <=5 (ref 5.0–8.0)

## 2018-01-15 LAB — MAGNESIUM: MAGNESIUM: 1.9 mg/dL (ref 1.5–2.5)

## 2018-01-15 LAB — TSH: TSH: 1.39 mIU/L (ref 0.40–4.50)

## 2018-01-15 LAB — INSULIN, RANDOM: Insulin: 34.6 u[IU]/mL — ABNORMAL HIGH (ref 2.0–19.6)

## 2018-01-15 LAB — VITAMIN D 25 HYDROXY (VIT D DEFICIENCY, FRACTURES): VIT D 25 HYDROXY: 52 ng/mL (ref 30–100)

## 2018-01-15 LAB — PSA: PSA: 0.8 ng/mL (ref <=4.0)

## 2018-02-01 ENCOUNTER — Ambulatory Visit: Payer: 59 | Admitting: Physician Assistant

## 2018-02-01 ENCOUNTER — Encounter: Payer: Self-pay | Admitting: Physician Assistant

## 2018-02-01 VITALS — BP 110/70 | HR 72 | Ht 73.0 in | Wt 247.0 lb

## 2018-02-01 DIAGNOSIS — I10 Essential (primary) hypertension: Secondary | ICD-10-CM | POA: Diagnosis not present

## 2018-02-01 DIAGNOSIS — G4733 Obstructive sleep apnea (adult) (pediatric): Secondary | ICD-10-CM | POA: Diagnosis not present

## 2018-02-01 DIAGNOSIS — I493 Ventricular premature depolarization: Secondary | ICD-10-CM

## 2018-02-01 DIAGNOSIS — E785 Hyperlipidemia, unspecified: Secondary | ICD-10-CM | POA: Diagnosis not present

## 2018-02-01 MED ORDER — METOPROLOL TARTRATE 25 MG PO TABS: 12.5000 mg | ORAL_TABLET | Freq: Two times a day (BID) | ORAL | 5 refills | Status: DC

## 2018-02-01 NOTE — Progress Notes (Signed)
Cardiology Office Note:    Date:  02/01/2018   ID:  [name], DOB 01/25/1961, MRN [id number]  PCP:  [name], MD  Cardiologist:  Mertie Moores, MD   Referring MD: [name], MD   Chief Complaint  Patient presents with  . Follow-up  PVCs  History of Present Illness:    SACHIN FERENCZ is a 57 y.o. male with a hx of asthma, hypertension, hyperlipidemia, obesity, OSA, seasonal allergies, diverticulitis who is being seen today for follow up after cardiology consult during a hospitalization for palpitations. He has a history of palpitations in June 2018 that coincided with increased caffeine consumption. Palpitations resolved when he stopped drinking caffeine. Palpitations returned in 07/2017 and he presented to [facility] ED.  Telemetry found rare PVCs.  All other labs within normal limits. Cardiology was not consulted; labs were WNL and he was discharged from the ED. He saw [name] PA-C in follow up on 08/10/17 to establish care with cardiology. At that time, he continued to have occasional palpitations most days of the week but denies tachycardia. He is active throughout the day, walking daily and riding his bike several times weekly. He denied anginal symptoms. TSH and Magnesium were WNL.  Echocardiogram showed normal LVEF with grade 1 DD and normal pressures.  48-hour holter monitor showed frequent PVCs and lopressor was increaed to 25 mg BID.   He again presented to [facility] on 08/16/17 with palpitations. Telemetry with rare PVCs and electrolytes WNL. Cardiology was not consulted. He was given PO lopressor and discharged. He saw Dr. Acie Fredrickson in clinic on 09/15/17. At that time, he was doing well but still having palpitations. He was under a lot of stress at that time. No medication changes and he was advised to follow up in 1 year.    He recently saw Dr. Radford Pax for a sleep study and was diagnosed with OSA. He does not yet have his CPAP/BiPAP, but has an appt with Dr.  Radford Pax next week to discuss his treatment plan.   He presents today for follow up of his palpitations. He states that his stress level has been elevated due to his daughter getting a divorce. His wife is also currently sick with a respiratory illness and he has not slept in about a week. He continues to have palpitations and questions if he should stop exercising. He has been taking lopressor 25 mg (0.5 tablet of 50 mg tablets), but thought he was only taking 12.5 mg tablets. He stopped the lopressor and palpitations returned. He does think the lopressor is helping his palpitations, but he feels sluggish and not well when he sits down to rest, which may be attributed to the beta blocker. He denies chest pain, shortness of breath, dizziness, and pre-syncope. He is trying to lose weight and would like to continue exercising.    Past Medical History:  Diagnosis Date  . Allergy   . Asthma   . Diverticulitis   . Diverticulitis 09/17/2015  . Essential hypertension   . Hemorrhoid   . Hyperlipidemia   . Kidney stones   . Mild dilation of ascending aorta (HCC)   . Obesity   . Obstructive sleep apnea 10/16/2010   Qualifier: Diagnosis of  By: Gwenette Greet MD, Armando Reichert  Not on CPAP    . Seasonal allergies 08/24/2013  . Swollen lymph nodes    abd, groin,     Past Surgical History:  Procedure Laterality Date  . VASECTOMY  MID 90S  Current Medications: Current Meds  Medication Sig  . albuterol (PROVENTIL HFA;VENTOLIN HFA) 108 (90 Base) MCG/ACT inhaler Inhale 1 puff into the lungs every 6 (six) hours as needed for wheezing or shortness of breath.  . Cholecalciferol (VITAMIN D3 PO) Take 6,000 Units by mouth daily.   . fluticasone (FLONASE) 50 MCG/ACT nasal spray Place 2 sprays into both nostrils as needed for allergies.  . metoprolol tartrate (LOPRESSOR) 25 MG tablet Take 0.5 tablets (12.5 mg total) by mouth 2 (two) times daily.  . [DISCONTINUED] metoprolol tartrate (LOPRESSOR) 50 MG tablet Take 0.5  tablets (25 mg total) 2 (two) times daily by mouth.     Allergies:   Shellfish allergy and Iodine   Social History   Socioeconomic History  . Marital status: Married    Spouse name: Not on file  . Number of children: Not on file  . Years of education: Not on file  . Highest education level: Not on file  Occupational History  . Not on file  Social Needs  . Financial resource strain: Not on file  . Food insecurity:    Worry: Not on file    Inability: Not on file  . Transportation needs:    Medical: Not on file    Non-medical: Not on file  Tobacco Use  . Smoking status: Never Smoker  . Smokeless tobacco: Former Systems developer    Types: Chew  Substance and Sexual Activity  . Alcohol use: Yes    Alcohol/week: 0.0 oz    Types: 1 - 2 Cans of beer per week    Comment: 2OZ A WEEK  . Drug use: No  . Sexual activity: Yes  Lifestyle  . Physical activity:    Days per week: Not on file    Minutes per session: Not on file  . Stress: Not on file  Relationships  . Social connections:    Talks on phone: Not on file    Gets together: Not on file    Attends religious service: Not on file    Active member of club or organization: Not on file    Attends meetings of clubs or organizations: Not on file    Relationship status: Not on file  Other Topics Concern  . Not on file  Social History Narrative  . Not on file     Family History: The patient's family history includes Atrial fibrillation in his mother; Cancer in his brother; Diabetes in his father; Heart disease in his father; Heart failure in his father; Stroke in his mother.  ROS:   Please see the history of present illness.     All other systems reviewed and are negative.  EKGs/Labs/Other Studies Reviewed:    The following studies were reviewed today:  Holter 08/11/17: Frequent PVCs Increase metoprolol to 25 mg BID. His echo shows normal LV function.  wil continue to titrate his meds to control these PVCs He should see [place] back  in several months   Echo  08/14/17 Study Conclusions - Left ventricle: The cavity size was normal. Systolic function was   vigorous. The estimated ejection fraction was in the range of 65%   to 70%. Wall motion was normal; there were no regional wall   motion abnormalities. Doppler parameters are consistent with   abnormal left ventricular relaxation (grade 1 diastolic   dysfunction). Doppler parameters are consistent with   indeterminate ventricular filling pressure. - Aortic valve: Transvalvular velocity was within the normal range.   There was no stenosis. There  was trivial regurgitation. - Aorta: Ascending aortic diameter: 42 mm (S). - Ascending aorta: The ascending aorta was mildly dilated. - Mitral valve: Transvalvular velocity was within the normal range.   There was no evidence for stenosis. There was no regurgitation. - Right ventricle: The cavity size was normal. Wall thickness was   normal. Systolic function was normal. - Tricuspid valve: There was mild regurgitation. - Pulmonary arteries: Systolic pressure was within the normal   range. PA peak pressure: 24 mm Hg (S).   EKG:  EKG is not ordered today.    Recent Labs: 01/14/2018: ALT 21; BUN 12; Creat 0.90; Hemoglobin 16.4; Magnesium 1.9; Platelets 267; Potassium 4.0; Sodium 143; TSH 1.39  Recent Lipid Panel    Component Value Date/Time   CHOL 222 (H) 01/14/2018 1443   TRIG 133 01/14/2018 1443   HDL 44 01/14/2018 1443   CHOLHDL 5.0 (H) 01/14/2018 1443   VLDL 18 01/13/2017 1445   LDLCALC 152 (H) 01/14/2018 1443    Physical Exam:    VS:  BP 110/70   Pulse 72   Ht 6\' 1"  (1.854 m)   Wt 247 lb (112 kg)   SpO2 95%   BMI 32.59 kg/m     Wt Readings from Last 3 Encounters:  02/01/18 247 lb (112 kg)  01/14/18 251 lb (113.9 kg)  12/21/17 251 lb (113.9 kg)     GEN:  Well nourished, well developed in no acute distress HEENT: Normal NECK: No JVD; No carotid bruits LYMPHATICS: No lymphadenopathy CARDIAC: RRR, no  murmurs, rubs, gallops RESPIRATORY:  Clear to auscultation without rales, wheezing or rhonchi  ABDOMEN: Soft, non-tender, non-distended MUSCULOSKELETAL:  No edema; No deformity  SKIN: Warm and dry NEUROLOGIC:  Alert and oriented x 3 PSYCHIATRIC:  Normal affect   ASSESSMENT:    1. PVC's (premature ventricular contractions)   2. Essential hypertension   3. Obstructive sleep apnea   4. Hyperlipidemia, unspecified hyperlipidemia type   5. Morbid obesity ([place])    PLAN:    In order of problems listed above:  PVC's (premature ventricular contractions) He will decrease his lopressor to 12.5 mg BID and track his palpitations. He denies dizziness and pre-syncope associated with palpitations. We also discussed a trial of OTC magnesium. I advised him it was OK to continue exercising and to make sure he is properly hydrating. If he does not tolerate lopressor well, we discussed the possibility of switching to a different beta blocker.  Essential hypertension Pressures are well-controlled. 12.5 mg lopressor BID as above.  Obstructive sleep apnea Keep appt next week with Dr. Radford Pax. I suspect treatment of his OSA may help his palpiations/PVC burden.  Hyperlipidemia, unspecified hyperlipidemia type 01/14/2018: Cholesterol 222; HDL 44; LDL Cholesterol (Calc) 152; Triglycerides 133 Pt is working on diet and exercise. I advised him that if he is not at goal at his yearly follow up, we would need to start a statin.  Morbid obesity ([place]) Pt is committed to losing weight. He has lost 30 lbs already. We discussed DASH and the mediterranean diet. He will continue exercising.    Medication Adjustments/Labs and Tests Ordered: Current medicines are reviewed at length with the patient today.  Concerns regarding medicines are outlined above.    Meds ordered this encounter  Medications  . metoprolol tartrate (LOPRESSOR) 25 MG tablet    Sig: Take 0.5 tablets (12.5 mg total) by mouth 2 (two) times daily.      Dispense:  30 tablet    Refill:  5  Signed, Ledora Bottcher, [place]  02/01/2018 12:08 PM    [facility]

## 2018-02-01 NOTE — Patient Instructions (Addendum)
Medication Instructions:    START TAKING LOPRESSOR 12.5 MG TWICE A DAY     If you need a refill on your cardiac medications before your next appointment, please call your pharmacy.  Labwork: NONE ORDERED  TODAY    Testing/Procedures: NONE ORDERED  TODAY    Follow-Up:   CONTACT OFFICE BACK IN  SEPTEMBER   Any Other Special Instructions Will Be Listed Below (If Applicable).

## 2018-02-10 ENCOUNTER — Encounter: Payer: Self-pay | Admitting: Cardiology

## 2018-02-10 ENCOUNTER — Ambulatory Visit: Payer: 59 | Admitting: Cardiology

## 2018-02-10 VITALS — BP 112/78 | HR 92 | Ht 73.0 in | Wt 248.0 lb

## 2018-02-10 DIAGNOSIS — I10 Essential (primary) hypertension: Secondary | ICD-10-CM

## 2018-02-10 DIAGNOSIS — G4733 Obstructive sleep apnea (adult) (pediatric): Secondary | ICD-10-CM

## 2018-02-10 NOTE — Progress Notes (Addendum)
Cardiology Office Note:    Date:  02/10/2018   ID:  David Fuller, DOB 1961-01-20, MRN [id number]  PCP:  [name], MD  Cardiologist:  Mertie Moores, MD    Referring MD: [name], MD   Chief Complaint  Patient presents with  . Sleep Apnea  . Hypertension    History of Present Illness:    David Fuller is a 57 y.o. male with a hx of hypertension, hyperlipidemia, asthma, obesity and obstructive sleep apnea.  He was recently referred for a sleep study after presenting to Dr. Acie Fredrickson with complaints of palpitations.  He complained that he was also having problems with snoring.  Underwent PSG showing moderate sleep apnea with an AHI of 23.0/h and mild oxygen desaturations as low as 86%.  He also was having loud snoring volume.  He underwent CPAP titration, due to ongoing respiratory events could not be adequately titrated.  He was then titrated on BiPAP 17/13 cm H2O. do not follow through with his BiPAP therapy.  He says that during his sleep study he could not tolerate the BiPAP because his mouth got extremely dry.  He therefore decided not to go ahead and get the device.  He is very worried that he will not be able to tolerate his mouth being that dry and that much air blowing into his mouth.  Past Medical History:  Diagnosis Date  . Allergy   . Asthma   . Diverticulitis   . Diverticulitis 09/17/2015  . Essential hypertension   . Hemorrhoid   . Hyperlipidemia   . Kidney stones   . Mild dilation of ascending aorta (HCC)   . Obesity   . Obstructive sleep apnea 10/16/2010   Qualifier: Diagnosis of  By: Gwenette Greet MD, Armando Reichert  Not on CPAP    . Seasonal allergies 08/24/2013  . Swollen lymph nodes    abd, groin,     Past Surgical History:  Procedure Laterality Date  . VASECTOMY  MID 90S    Current Medications: Current Meds  Medication Sig  . albuterol (PROVENTIL HFA;VENTOLIN HFA) 108 (90 Base) MCG/ACT inhaler Inhale 1 puff into the lungs every 6 (six) hours  as needed for wheezing or shortness of breath.  . Cholecalciferol (VITAMIN D3 PO) Take 6,000 Units by mouth daily.   . fluticasone (FLONASE) 50 MCG/ACT nasal spray Place 2 sprays into both nostrils as needed for allergies.  . metoprolol tartrate (LOPRESSOR) 25 MG tablet Take 0.5 tablets (12.5 mg total) by mouth 2 (two) times daily.     Allergies:   Shellfish allergy and Iodine   Social History   Socioeconomic History  . Marital status: Married    Spouse name: Not on file  . Number of children: Not on file  . Years of education: Not on file  . Highest education level: Not on file  Occupational History  . Not on file  Social Needs  . Financial resource strain: Not on file  . Food insecurity:    Worry: Not on file    Inability: Not on file  . Transportation needs:    Medical: Not on file    Non-medical: Not on file  Tobacco Use  . Smoking status: Never Smoker  . Smokeless tobacco: Former Systems developer    Types: Chew  Substance and Sexual Activity  . Alcohol use: Yes    Alcohol/week: 0.0 oz    Types: 1 - 2 Cans of beer per week    Comment: 2OZ A WEEK  .  Drug use: No  . Sexual activity: Yes  Lifestyle  . Physical activity:    Days per week: Not on file    Minutes per session: Not on file  . Stress: Not on file  Relationships  . Social connections:    Talks on phone: Not on file    Gets together: Not on file    Attends religious service: Not on file    Active member of club or organization: Not on file    Attends meetings of clubs or organizations: Not on file    Relationship status: Not on file  Other Topics Concern  . Not on file  Social History Narrative  . Not on file     Family History: The patient's family history includes Atrial fibrillation in his mother; Cancer in his brother; Diabetes in his father; Heart disease in his father; Heart failure in his father; Stroke in his mother.  ROS:   Please see the history of present illness.    ROS  All other systems  reviewed and negative.   EKGs/Labs/Other Studies Reviewed:    The following studies were reviewed today: PAP download  EKG:  EKG is not ordered today.    Recent Labs: 01/14/2018: ALT 21; BUN 12; Creat 0.90; Hemoglobin 16.4; Magnesium 1.9; Platelets 267; Potassium 4.0; Sodium 143; TSH 1.39   Recent Lipid Panel    Component Value Date/Time   CHOL 222 (H) 01/14/2018 1443   TRIG 133 01/14/2018 1443   HDL 44 01/14/2018 1443   CHOLHDL 5.0 (H) 01/14/2018 1443   VLDL 18 01/13/2017 1445   LDLCALC 152 (H) 01/14/2018 1443    Physical Exam:    VS:  BP 112/78   Pulse 92   Ht 6\' 1"  (1.854 m)   Wt 248 lb (112.5 kg)   BMI 32.72 kg/m     Wt Readings from Last 3 Encounters:  02/10/18 248 lb (112.5 kg)  02/01/18 247 lb (112 kg)  01/14/18 251 lb (113.9 kg)     GEN:  Well nourished, well developed in no acute distress HEENT: Normal NECK: No JVD; No carotid bruits LYMPHATICS: No lymphadenopathy CARDIAC: RRR, no murmurs, rubs, gallops RESPIRATORY:  Clear to auscultation without rales, wheezing or rhonchi  ABDOMEN: Soft, non-tender, non-distended MUSCULOSKELETAL:  No edema; No deformity  SKIN: Warm and dry NEUROLOGIC:  Alert and oriented x 3 PSYCHIATRIC:  Normal affect   ASSESSMENT:    1. Obstructive sleep apnea   2. Essential hypertension   3. Morbid obesity ([place])    PLAN:    In order of problems listed above:  1.  OSA - PSG showed moderate obstructive sleep apnea with an AHI of 23/h mild oxygen saturations as low as 86%.  Due to ongoing respiratory events he was not able to be titrated adequately with CPAP and underwent BiPAP titration to 17/13 cm H2O.  He never started his BiPAP because he said that he could not tolerate the BiPAP at the sleep lab because his mouth got terribly dry so he decided not to start the BiPAP.  We had a long discussion today about BiPAP treatment.  I told him that given the moderate severity of his sleep apnea he would likely not benefit from an oral  device.  We did talk at length about the inspire device but he is not interested in that at this time and he would have to feel BiPAP therapy first.  He is willing to try the BiPAP and I explained to him  how to adjust the humidity to try to keep his mouth moist.  We will start him on auto BiPAP with an IPAP of 6-18 cm, EPAP of 6 cm and pressure support of 4 cm.  I will have him see me back in 10 weeks.  He will call me if he is having difficulty.  2.  HTN - pressure is well controlled on exam today.  Continue on Lopressor 12.5 mg twice daily.  3.  Obesity - I have encouraged him to get into a routine exercise program and cut back on carbs and portions.    Medication Adjustments/Labs and Tests Ordered: Current medicines are reviewed at length with the patient today.  Concerns regarding medicines are outlined above.  No orders of the defined types were placed in this encounter.  No orders of the defined types were placed in this encounter.   Signed, Fransico Him, MD  02/10/2018 2:35 PM    [place]

## 2018-02-10 NOTE — Telephone Encounter (Addendum)
Patient understands he will be contacted by [facility] to set up his bipap. Patient understands to call if [facility] does not contact him with new setup in a timely manner. Patient understands they will be called once confirmation has been received from [facility] that they have received their new machine to schedule 10 week follow up appointment.  [place] notified of new cpap order  Please add to airview Patient was grateful for the call and thanked me.

## 2018-02-10 NOTE — Telephone Encounter (Signed)
DME order  Teressa Senter, RN  Freada Bergeron, CMA        DME order placed

## 2018-02-10 NOTE — Patient Instructions (Signed)
Medication Instructions:  Your physician recommends that you continue on your current medications as directed. Please refer to the Current Medication list given to you today.  Labwork: None Ordered   Testing/Procedures: None Ordered   Follow-Up: Your physician wants you to follow-up in: 10 weeks after you have started using your BIPAP  Any Other Special Instructions Will Be Listed Below (If Applicable).  BIPAP orders have been placed. You will receive a call from the home health agency regarding setting up equipment. If you do not receive a call within the next week give [name], CPAP assistant a call at [phone].   Thank you for choosing Newton Hamilton, RN  [phone]    If you need a refill on your cardiac medications before your next appointment, please call your pharmacy.

## 2018-02-15 NOTE — Addendum Note (Signed)
Addended by: Freada Bergeron on: 02/15/2018 08:49 AM   Modules accepted: Orders

## 2018-02-15 NOTE — Telephone Encounter (Signed)
RE: BiPAP order  David Margarita, MD  David Fuller, CMA        IPAP of 18cm H2O   Fransico Him

## 2018-02-17 DIAGNOSIS — K648 Other hemorrhoids: Secondary | ICD-10-CM | POA: Diagnosis not present

## 2018-02-17 DIAGNOSIS — K644 Residual hemorrhoidal skin tags: Secondary | ICD-10-CM | POA: Diagnosis not present

## 2018-02-19 DIAGNOSIS — L905 Scar conditions and fibrosis of skin: Secondary | ICD-10-CM | POA: Diagnosis not present

## 2018-02-19 DIAGNOSIS — Z85828 Personal history of other malignant neoplasm of skin: Secondary | ICD-10-CM | POA: Diagnosis not present

## 2018-03-02 DIAGNOSIS — G4733 Obstructive sleep apnea (adult) (pediatric): Secondary | ICD-10-CM | POA: Diagnosis not present

## 2018-03-17 NOTE — Telephone Encounter (Signed)
Patient has a 10 week follow up appointment scheduled for 6/19/ 2019 at 9:20.. Patient understands she needs to keep this appointment for insurance compliance. Patient was grateful for the call and thanked me.

## 2018-04-01 DIAGNOSIS — G4733 Obstructive sleep apnea (adult) (pediatric): Secondary | ICD-10-CM | POA: Diagnosis not present

## 2018-04-28 ENCOUNTER — Encounter: Payer: Self-pay | Admitting: Cardiology

## 2018-04-28 ENCOUNTER — Ambulatory Visit: Payer: 59 | Admitting: Cardiology

## 2018-04-28 VITALS — BP 116/76 | HR 67 | Ht 73.0 in | Wt 260.0 lb

## 2018-04-28 DIAGNOSIS — I10 Essential (primary) hypertension: Secondary | ICD-10-CM

## 2018-04-28 DIAGNOSIS — G4733 Obstructive sleep apnea (adult) (pediatric): Secondary | ICD-10-CM | POA: Diagnosis not present

## 2018-04-28 NOTE — Patient Instructions (Signed)
Medication Instructions:  Your physician recommends that you continue on your current medications as directed. Please refer to the Current Medication list given to you today.  If you need a refill on your cardiac medications, please contact your pharmacy first.  Labwork: None ordered   Testing/Procedures: None ordered   Follow-Up: Your physician wants you to follow-up in: 1 year with Dr. Turner. You will receive a reminder letter in the mail two months in advance. If you don't receive a letter, please call our office to schedule the follow-up appointment.  Any Other Special Instructions Will Be Listed Below (If Applicable).   Thank you for choosing [facility]    Rena Kerrilyn Azbill, RN  [phone]  If you need a refill on your cardiac medications before your next appointment, please call your pharmacy.   

## 2018-04-28 NOTE — Progress Notes (Signed)
Cardiology Office Note:    Date:  04/28/2018   ID:  [name], DOB 1961-04-15, MRN [id number]  PCP:  [name], MD  Cardiologist:  Mertie Moores, MD    Referring MD: [name], MD   Chief Complaint  Patient presents with  . Sleep Apnea  . Hypertension    History of Present Illness:    David Fuller is a 57 y.o. male with a hx of PSG showed moderate obstructive sleep apnea with an AHI of 23/h mild oxygen saturations as low as 86%.  Due to ongoing respiratory events he was not able to be titrated adequately with CPAP and underwent BiPAP titration to 17/13 cm H2O.  He never started his BiPAP because he said that he could not tolerate the BiPAP at the sleep lab because his mouth got terribly dry so he decided not to start the BiPAP.  At last OV we had a long discussion about BiPAP treatment.  I told him that given the moderate severity of his sleep apnea he would likely not benefit from an oral device.  We did talk at length about the inspire device but he was not interested in it and he would have to fail BiPAP therapy first.  He was willing to try the BiPAP and I explained to him how to adjust the humidity to try to keep his mouth moist.  He was started on  auto BiPAP with an IPAP of 6-18 cm, EPAP of 6 cm and pressure support of 4 cm. He is now back for followup.   He is doing well with his PAP device and thinks that he has gotten used to it.  He tolerates the mask and feels the pressure is adequate.  Since going on CPAP he feels rested in the am and has no significant daytime sleepiness.  He denies any significant mouth or nasal dryness or nasal congestion.  He does not think that he snores.    Past Medical History:  Diagnosis Date  . Allergy   . Asthma   . Diverticulitis   . Diverticulitis 09/17/2015  . Essential hypertension   . Hemorrhoid   . Hyperlipidemia   . Kidney stones   . Mild dilation of ascending aorta (HCC)   . Obesity   . Obstructive sleep  apnea 10/16/2010   Qualifier: Diagnosis of  By: Gwenette Greet MD, Armando Reichert  Not on CPAP    . Seasonal allergies 08/24/2013  . Swollen lymph nodes    abd, groin,     Past Surgical History:  Procedure Laterality Date  . VASECTOMY  MID 90S    Current Medications: Current Meds  Medication Sig  . albuterol (PROVENTIL HFA;VENTOLIN HFA) 108 (90 Base) MCG/ACT inhaler Inhale 1 puff into the lungs every 6 (six) hours as needed for wheezing or shortness of breath.  . Cholecalciferol (VITAMIN D3 PO) Take 6,000 Units by mouth daily.   . fluticasone (FLONASE) 50 MCG/ACT nasal spray Place 2 sprays into both nostrils as needed for allergies.  . Magnesium 400 MG CAPS Take 1 tablet by mouth daily.  . metoprolol tartrate (LOPRESSOR) 25 MG tablet Take 0.5 tablets (12.5 mg total) by mouth 2 (two) times daily.     Allergies:   Shellfish allergy and Iodine   Social History   Socioeconomic History  . Marital status: Married    Spouse name: Not on file  . Number of children: Not on file  . Years of education: Not on file  . Highest  education level: Not on file  Occupational History  . Not on file  Social Needs  . Financial resource strain: Not on file  . Food insecurity:    Worry: Not on file    Inability: Not on file  . Transportation needs:    Medical: Not on file    Non-medical: Not on file  Tobacco Use  . Smoking status: Never Smoker  . Smokeless tobacco: Former Systems developer    Types: Chew  Substance and Sexual Activity  . Alcohol use: Yes    Alcohol/week: 0.6 - 1.2 oz    Types: 1 - 2 Cans of beer per week    Comment: 2OZ A WEEK  . Drug use: No  . Sexual activity: Yes  Lifestyle  . Physical activity:    Days per week: Not on file    Minutes per session: Not on file  . Stress: Not on file  Relationships  . Social connections:    Talks on phone: Not on file    Gets together: Not on file    Attends religious service: Not on file    Active member of club or organization: Not on file    Attends  meetings of clubs or organizations: Not on file    Relationship status: Not on file  Other Topics Concern  . Not on file  Social History Narrative  . Not on file     Family History: The patient's family history includes Atrial fibrillation in his mother; Cancer in his brother; Diabetes in his father; Heart disease in his father; Heart failure in his father; Stroke in his mother.  ROS:   Please see the history of present illness.    ROS  All other systems reviewed and negative.   EKGs/Labs/Other Studies Reviewed:    The following studies were reviewed today: PAP download  EKG:  EKG is not ordered today.   Recent Labs: 01/14/2018: ALT 21; BUN 12; Creat 0.90; Hemoglobin 16.4; Magnesium 1.9; Platelets 267; Potassium 4.0; Sodium 143; TSH 1.39   Recent Lipid Panel    Component Value Date/Time   CHOL 222 (H) 01/14/2018 1443   TRIG 133 01/14/2018 1443   HDL 44 01/14/2018 1443   CHOLHDL 5.0 (H) 01/14/2018 1443   VLDL 18 01/13/2017 1445   LDLCALC 152 (H) 01/14/2018 1443    Physical Exam:    VS:  BP 116/76   Pulse 67   Ht 6\' 1"  (1.854 m)   Wt 260 lb (117.9 kg)   SpO2 98%   BMI 34.30 kg/m     Wt Readings from Last 3 Encounters:  04/28/18 260 lb (117.9 kg)  02/10/18 248 lb (112.5 kg)  02/01/18 247 lb (112 kg)     GEN:  Well nourished, well developed in no acute distress HEENT: Normal NECK: No JVD; No carotid bruits LYMPHATICS: No lymphadenopathy CARDIAC: RRR, no murmurs, rubs, gallops RESPIRATORY:  Clear to auscultation without rales, wheezing or rhonchi  ABDOMEN: Soft, non-tender, non-distended MUSCULOSKELETAL:  No edema; No deformity  SKIN: Warm and dry NEUROLOGIC:  Alert and oriented x 3 PSYCHIATRIC:  Normal affect   ASSESSMENT:    1. Obstructive sleep apnea   2. Essential hypertension   3. Morbid obesity ([place])    PLAN:    In order of problems listed above:  1.  OSA - the patient is tolerating PAP therapy well without any problems. The PAP download was  reviewed today and showed an AHI of 0.4/hr on auto PAP with 100% compliance  in using more than 4 hours nightly.  The patient has been using and benefiting from PAP use and will continue to benefit from therapy.   2.  HTN - BP is well controlled on exam today.  He will continue on Lopressor 12.5mg  BID.  3.  Obesity - I have encouraged him to get into a routine exercise program and cut back on carbs and portions.     Medication Adjustments/Labs and Tests Ordered: Current medicines are reviewed at length with the patient today.  Concerns regarding medicines are outlined above.  No orders of the defined types were placed in this encounter.  No orders of the defined types were placed in this encounter.   Signed, Fransico Him, MD  04/28/2018 9:31 AM    [place]

## 2018-04-29 ENCOUNTER — Telehealth: Payer: Self-pay | Admitting: *Deleted

## 2018-04-29 NOTE — Telephone Encounter (Signed)
Order chin strap  ORDER SENT TO [facility]

## 2018-04-29 NOTE — Telephone Encounter (Signed)
-----   Message from Teressa Senter, RN sent at 04/28/2018  9:34 AM EDT ----- Regarding: dme order dme order placed   Rena

## 2018-04-30 DIAGNOSIS — G4733 Obstructive sleep apnea (adult) (pediatric): Secondary | ICD-10-CM | POA: Diagnosis not present

## 2018-05-02 DIAGNOSIS — G4733 Obstructive sleep apnea (adult) (pediatric): Secondary | ICD-10-CM | POA: Diagnosis not present

## 2018-05-03 ENCOUNTER — Ambulatory Visit: Payer: 59 | Admitting: Cardiovascular Disease

## 2018-05-04 DIAGNOSIS — G4733 Obstructive sleep apnea (adult) (pediatric): Secondary | ICD-10-CM | POA: Diagnosis not present

## 2018-05-15 DIAGNOSIS — Z23 Encounter for immunization: Secondary | ICD-10-CM | POA: Diagnosis not present

## 2018-05-15 DIAGNOSIS — S61411S Laceration without foreign body of right hand, sequela: Secondary | ICD-10-CM | POA: Diagnosis not present

## 2018-05-15 DIAGNOSIS — S61411A Laceration without foreign body of right hand, initial encounter: Secondary | ICD-10-CM | POA: Diagnosis not present

## 2018-05-23 DIAGNOSIS — J45909 Unspecified asthma, uncomplicated: Secondary | ICD-10-CM | POA: Diagnosis not present

## 2018-05-23 DIAGNOSIS — J40 Bronchitis, not specified as acute or chronic: Secondary | ICD-10-CM | POA: Diagnosis not present

## 2018-05-26 ENCOUNTER — Other Ambulatory Visit: Payer: Self-pay | Admitting: Internal Medicine

## 2018-05-26 DIAGNOSIS — R062 Wheezing: Secondary | ICD-10-CM | POA: Diagnosis not present

## 2018-05-26 DIAGNOSIS — J069 Acute upper respiratory infection, unspecified: Secondary | ICD-10-CM | POA: Diagnosis not present

## 2018-06-01 DIAGNOSIS — G4733 Obstructive sleep apnea (adult) (pediatric): Secondary | ICD-10-CM | POA: Diagnosis not present

## 2018-06-14 ENCOUNTER — Telehealth: Payer: Self-pay | Admitting: Cardiovascular Disease

## 2018-06-14 NOTE — Telephone Encounter (Signed)
New Message:       Pt c/o medication issue:  1. Name of Medication: metoprolol tartrate (LOPRESSOR) 25 MG tablet  2. How are you currently taking this medication (dosage and times per day)? Take 0.5 tablets (12.5 mg total) by mouth 2 (two) times daily.  3. Are you having a reaction (difficulty breathing--STAT)? No  4. What is your medication issue? Patient states this medication is causing him to have some dizziness.

## 2018-06-14 NOTE — Telephone Encounter (Signed)
His HR and BP are not low -   He may have a non-cardiac reason for his dizziness. He should ask his medical doctor about other causes. If he stops the metoprolol, I think the PVCs will worsen

## 2018-06-14 NOTE — Telephone Encounter (Addendum)
Pt states that he has been having some dizziness for a few days. Pt doesn't know if the Metoprolol is making him have these symptoms. Pt is taking Metoprolol 12.5 mg twice a day. Pt states that he didn't have any symptoms before with this medication at that lower dose. Pt states that his HR now is approximately 78 beats/minute with PVC's, BP 146/94. Pt states that he has been in a lot of stress with family ( daughter's issues). Pt states now he is going to go to the [facility] so he can help with stress. Pt was taking metoprolol 25 mg BID before and it was changed to 12.5 mg BID when he was seen by the PA on 02/01/18 due to dizziness. Pt also wants to know if he has to deal with the PVC's and if he needs to take this medication all his life. Pt is aware that these message will be send to MD for recommendations.

## 2018-06-15 ENCOUNTER — Ambulatory Visit: Payer: 59 | Admitting: Internal Medicine

## 2018-06-15 VITALS — BP 120/80 | HR 64 | Temp 97.7°F | Resp 18 | Ht 73.0 in | Wt 246.6 lb

## 2018-06-15 DIAGNOSIS — Z136 Encounter for screening for cardiovascular disorders: Secondary | ICD-10-CM

## 2018-06-15 DIAGNOSIS — R002 Palpitations: Secondary | ICD-10-CM | POA: Diagnosis not present

## 2018-06-15 DIAGNOSIS — Z79899 Other long term (current) drug therapy: Secondary | ICD-10-CM

## 2018-06-15 DIAGNOSIS — R030 Elevated blood-pressure reading, without diagnosis of hypertension: Secondary | ICD-10-CM

## 2018-06-15 MED ORDER — CITALOPRAM HYDROBROMIDE 40 MG PO TABS: ORAL_TABLET | ORAL | 1 refills | Status: DC

## 2018-06-15 MED ORDER — VERAPAMIL HCL ER 120 MG PO TBCR
EXTENDED_RELEASE_TABLET | ORAL | 1 refills | Status: DC
Start: 2018-06-15 — End: 2018-12-10

## 2018-06-15 NOTE — Telephone Encounter (Signed)
Called patient and made him aware of Dr. Elmarie Shiley recommendations below. Patient states that he went to see his PCP Dr. Melford Aase earlier today and states that he was in "trigeminy". He states that his metoprolol was d/c'd and he was instructed to start verapamil 120 mg QD. (Notes and EKG not available in Epic yet). Will forward to Dr. Acie Fredrickson to make aware.

## 2018-06-16 LAB — COMPLETE METABOLIC PANEL WITH GFR
AG RATIO: 1.9 (calc) (ref 1.0–2.5)
ALBUMIN MSPROF: 4.3 g/dL (ref 3.6–5.1)
ALKALINE PHOSPHATASE (APISO): 61 U/L (ref 40–115)
ALT: 31 U/L (ref 9–46)
AST: 28 U/L (ref 10–35)
BILIRUBIN TOTAL: 1.4 mg/dL — AB (ref 0.2–1.2)
BUN: 14 mg/dL (ref 7–25)
CHLORIDE: 106 mmol/L (ref 98–110)
CO2: 29 mmol/L (ref 20–32)
Calcium: 9.6 mg/dL (ref 8.6–10.3)
Creat: 1 mg/dL (ref 0.70–1.33)
GFR, EST AFRICAN AMERICAN: 96 mL/min/{1.73_m2} (ref >=60)
GFR, Est Non African American: 83 mL/min/{1.73_m2} (ref >=60)
GLUCOSE: 91 mg/dL (ref 65–99)
Globulin: 2.3 g/dL (calc) (ref 1.9–3.7)
POTASSIUM: 5.1 mmol/L (ref 3.5–5.3)
SODIUM: 142 mmol/L (ref 135–146)
TOTAL PROTEIN: 6.6 g/dL (ref 6.1–8.1)

## 2018-06-16 LAB — MAGNESIUM: MAGNESIUM: 2.2 mg/dL (ref 1.5–2.5)

## 2018-06-16 LAB — TSH: TSH: 2.26 m[IU]/L (ref 0.40–4.50)

## 2018-06-16 NOTE — Progress Notes (Signed)
   Subjective:    Patient ID: David Fuller, male    DOB: 09/22/1961, 57 y.o.   MRN: [id number]  HPI   This very nice 57 yo MWM with prior hx/o palpitations had an ER eval in Sept 2018 for palpitations & acute Coronary syndrome ruled out. 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 He admits a lot of home stress with a divorcing daughter and a 2 yo [place] child having moved back home with he & his wife.   Medication Sig  . albuterol (PROVENTIL HFA;VENTOLIN HFA) 108 (90 Base) MCG/ACT inhaler Inhale 1 puff into the lungs every 6 (six) hours as needed for wheezing or shortness of breath.  . Cholecalciferol (VITAMIN D3 PO) Take 6,000 Units by mouth daily.   . fluticasone (FLONASE) 50 MCG/ACT nasal spray Place 2 sprays into both nostrils as needed for allergies.  . Magnesium 400 MG CAPS Take 1 tablet by mouth daily.  . metoprolol tartrate (LOPRESSOR) 25 MG tablet Take 0.5 tablets (12.5 mg total) by mouth 2 (two) times daily. (Patient not taking: Reported on 06/15/2018)  . OVER THE COUNTER MEDICATION Takes One a Day Men's Health formula MVI 1 daily.   Allergies  Allergen Reactions  . Shellfish Allergy Nausea Only  . Iodine Other (See Comments)    Unknown Unknown Allergic to shellfish - nausea   Past Medical History:  Diagnosis Date  . Allergy   . Asthma   . Diverticulitis   . Diverticulitis 09/17/2015  . Essential hypertension   . Hemorrhoid   . Hyperlipidemia   . Kidney stones   . Mild dilation of ascending aorta (HCC)   . Obesity   . Obstructive sleep apnea 10/16/2010   Qualifier: Diagnosis of  By: Gwenette Greet MD, Armando Reichert  Not on CPAP    . Seasonal allergies 08/24/2013  . Swollen lymph nodes    abd, groin,    Review of Systems   10 point systems review negative except as above.    Objective:   Physical Exam  BP 120/80   P 64   T 97.7 F   Resp 18   Ht 6\' 1"     Wt 246 lb 9.6 oz  BMI  32.53  Postural Sitting BP 132/92  P 79       &     Standing BP 141/91   P 80  HEENT - WNL. Neck - supple.  Chest - Clear equal BS. Cor - Nl HS. RRR w/o sig MGR. PP 1(+). No edema. MS- FROM w/o deformities.  Gait Nl. Neuro -  Nl w/o focal abnormalities.     Assessment & Plan:   1. Palpitations  - verapamil (CALAN-SR) 120 MG CR tablet; Take 1 tablet daily with food for Palpitations  Dispense: 90 tablet; Refill: 1  - citalopram (CELEXA) 40 MG tablet; Take 1 tablet daily  Dispense: 90 tablet; Refill: 1  - COMPLETE METABOLIC PANEL WITH GFR - Magnesium - TSH  2. Medication management  - COMPLETE METABOLIC PANEL WITH GFR - Magnesium - TSH

## 2018-06-17 NOTE — Telephone Encounter (Signed)
Will see how he responds to the verapamil instead of the Toprol

## 2018-06-18 ENCOUNTER — Telehealth: Payer: Self-pay | Admitting: *Deleted

## 2018-06-18 NOTE — Telephone Encounter (Signed)
Patient called and reported he cannot feel any improvement in his palpations since starting Verapamil. Per Dr Melford Aase, he should restart his Metoprolol 25 mg 1/2 tablet twice a day and advised to call Dr Melford Aase, if he has any problems over the weekend.

## 2018-06-20 ENCOUNTER — Encounter: Payer: Self-pay | Admitting: Internal Medicine

## 2018-06-24 NOTE — Addendum Note (Signed)
Addended by: [name] on: 06/24/2018 10:42 PM   Modules accepted: Orders

## 2018-07-02 DIAGNOSIS — G4733 Obstructive sleep apnea (adult) (pediatric): Secondary | ICD-10-CM | POA: Diagnosis not present

## 2018-07-30 ENCOUNTER — Ambulatory Visit: Payer: Self-pay | Admitting: Physician Assistant

## 2018-08-02 DIAGNOSIS — G4733 Obstructive sleep apnea (adult) (pediatric): Secondary | ICD-10-CM | POA: Diagnosis not present

## 2018-08-11 ENCOUNTER — Other Ambulatory Visit: Payer: Self-pay | Admitting: Cardiovascular Disease

## 2018-08-11 ENCOUNTER — Ambulatory Visit: Payer: Self-pay | Admitting: Physician Assistant

## 2018-08-11 DIAGNOSIS — I493 Ventricular premature depolarization: Secondary | ICD-10-CM

## 2018-08-11 MED ORDER — METOPROLOL TARTRATE 25 MG PO TABS
12.5000 mg | ORAL_TABLET | Freq: Two times a day (BID) | ORAL | 5 refills | Status: DC
Start: 2018-08-11 — End: 2019-01-19

## 2018-08-17 DIAGNOSIS — G4733 Obstructive sleep apnea (adult) (pediatric): Secondary | ICD-10-CM | POA: Diagnosis not present

## 2018-08-23 DIAGNOSIS — D1801 Hemangioma of skin and subcutaneous tissue: Secondary | ICD-10-CM | POA: Diagnosis not present

## 2018-08-23 DIAGNOSIS — L821 Other seborrheic keratosis: Secondary | ICD-10-CM | POA: Diagnosis not present

## 2018-08-23 DIAGNOSIS — L218 Other seborrheic dermatitis: Secondary | ICD-10-CM | POA: Diagnosis not present

## 2018-09-01 DIAGNOSIS — G4733 Obstructive sleep apnea (adult) (pediatric): Secondary | ICD-10-CM | POA: Diagnosis not present

## 2018-09-06 ENCOUNTER — Ambulatory Visit: Payer: Self-pay | Admitting: Physician Assistant

## 2018-09-14 ENCOUNTER — Ambulatory Visit (INDEPENDENT_AMBULATORY_CARE_PROVIDER_SITE_OTHER): Payer: 59 | Admitting: Adult Health Nurse Practitioner

## 2018-09-14 ENCOUNTER — Encounter: Payer: Self-pay | Admitting: Adult Health Nurse Practitioner

## 2018-09-14 VITALS — BP 120/64 | HR 78 | Temp 97.4°F | Resp 16 | Ht 73.0 in | Wt 259.0 lb

## 2018-09-14 DIAGNOSIS — E785 Hyperlipidemia, unspecified: Secondary | ICD-10-CM | POA: Diagnosis not present

## 2018-09-14 DIAGNOSIS — Z23 Encounter for immunization: Secondary | ICD-10-CM

## 2018-09-14 DIAGNOSIS — Z79899 Other long term (current) drug therapy: Secondary | ICD-10-CM

## 2018-09-14 DIAGNOSIS — I493 Ventricular premature depolarization: Secondary | ICD-10-CM | POA: Diagnosis not present

## 2018-09-14 DIAGNOSIS — I10 Essential (primary) hypertension: Secondary | ICD-10-CM | POA: Diagnosis not present

## 2018-09-14 DIAGNOSIS — J302 Other seasonal allergic rhinitis: Secondary | ICD-10-CM

## 2018-09-14 DIAGNOSIS — R7303 Prediabetes: Secondary | ICD-10-CM

## 2018-09-14 DIAGNOSIS — G4733 Obstructive sleep apnea (adult) (pediatric): Secondary | ICD-10-CM

## 2018-09-14 DIAGNOSIS — K9041 Non-celiac gluten sensitivity: Secondary | ICD-10-CM

## 2018-09-14 DIAGNOSIS — F325 Major depressive disorder, single episode, in full remission: Secondary | ICD-10-CM

## 2018-09-14 DIAGNOSIS — J45909 Unspecified asthma, uncomplicated: Secondary | ICD-10-CM

## 2018-09-14 DIAGNOSIS — E559 Vitamin D deficiency, unspecified: Secondary | ICD-10-CM

## 2018-09-14 DIAGNOSIS — Z91018 Allergy to other foods: Secondary | ICD-10-CM

## 2018-09-14 NOTE — Progress Notes (Signed)
[name] Kitchen  FOLLOW UP 26month  Assessment and Plan:   David Fuller was seen today for follow-up.  Diagnoses and all orders for this visit:  Essential hypertension Controlled today -     CBC with Differential/Platelet -     COMPLETE METABOLIC PANEL WITH GFR -     Magnesium Monitor blood pressure at home; call if consistently over 130/80 Continue DASH diet.   Reminder to go to the ER if any CP, SOB, nausea, dizziness, severe HA, changes vision/speech, left arm numbness and tingling and jaw pain.  Hyperlipidemia, unspecified hyperlipidemia type -     Lipid panel Not on statin at this time, discussed possible need for this, cardiology also has discussed importance with patient. decrease fatty foods increase activity.   Prediabetes -     Hemoglobin A1c -     Insulin, random Discussed dietary and exercise modifications   PVC's (premature ventricular contractions) -     Magnesium Continue current regiment Has appointment with Cardiology in one day Would like to come off of these medications, will discuss with Dr Cathie Olden  Obstructive sleep apnea Doing well with recent switch to BiPap Continue Discussed mask hygiene  Uncomplicated asthma, unspecified asthma severity, unspecified whether persistent Doing well has rescue inhaler that is UTD, has not had to use this  Depression, major, in remission ([place]) Doing well on Celexa, hlf tablet. Reports cause of this is resolving and would like to discontinue medication. Take half tablet every other day for one week, then one tablet twice a week the discontinue.  Obesity with co morbidities BMI 34 Long discussion about weight loss, diet, and exercise Recommended diet heavy in fruits and veggies and low in animal meats, cheeses, and dairy products, appropriate calorie intake Patient will work on increasing exercise and avoiding sweets. Will follow up in 6 months  Gluten intolerance -     Allergy Panel 15, [facility] -     Celiac Disease  Comprehensive Panel with Reflexes; Future Discussed avoid foods that caused the coughing or discomfort.  Multiple food allergies -     Allergy Panel 15, [facility]  Seasonal allergies -     Uses Flonase PRN Well controlled at this time  Need for immunization against influenza -     FLU VACCINE MDCK QUAD W/Preservative  Medication management -     CBC with Differential/Platelet -     COMPLETE METABOLIC PANEL WITH GFR -     Cancel: Hepatic function panel -     Magnesium -     Lipid panel -     Hemoglobin A1c -     VITAMIN D 25 Hydroxy (Vit-D Deficiency, Fractures) -     Insulin, random -     Allergy Panel 15, [facility]   Vitamin D Def Not at goal at last visit; continue supplementation to maintain goal of 70-100. Will check today  Continue diet and meds as discussed. Further disposition pending results of labs. Discussed med's effects and SE's.   Over 30 minutes of exam, counseling, chart review, and critical decision making was performed.   Future Appointments  Date Time Provider [place]  12/16/2018  9:15 AM [facility]  01/19/2019 10:00 AM Vicie Mutters, PA-C [facility] None    ----------------------------------------------------------------------------------------------------------------------  HPI 56 y.o. male  presents for 6 month follow up on hypertension, cholesterol, diabetes, weight and vitamin D deficiency.  Has recently joined the gym and he is riding his bike 75miles a week.  Reports he stays  away from Sodas and sweet tea.  Reports he started taking Celexa 40mg  (half tablet) as instructed.  This helped and he did not increase the dose.  He reports that the stress in his life has been decreased and he would like to discussed stopping this medications.  Last visit he was started on Verapamil 120mg  daily and the metoporolol 12.5mg  BID.  Reports he has not had any palpations, chest pain or shortness of breath.  He is  following with Dr Cathie Olden and has a follow up in one day. Completed sleep study in May and this past April he was change from Cpap to Bipap.  Reports significant improvement in sleep and now feels well rested next day.  Following with Dr Radford Pax for this and next appointment is in 45months.  BMI is Body mass index is 34.17 kg/m., he has been working on diet and exercise. Down 3lbs from last visit.  Encouraged positive behavior change. Wt Readings from Last 3 Encounters:  09/15/18 256 lb (116.1 kg)  09/14/18 259 lb (117.5 kg)  06/15/18 246 lb 9.6 oz (111.9 kg)    His blood pressure has been controlled at home, today their BP is BP: 120/64  He does workout. He denies chest pain, shortness of breath, dizziness.   He is on cholesterol medication, Crestor. His cholesterol is not at goal. The cholesterol last visit was:   Lab Results  Component Value Date   CHOL 218 (H) 09/14/2018   HDL 48 09/14/2018   LDLCALC 139 (H) 09/14/2018   TRIG 179 (H) 09/14/2018   CHOLHDL 4.5 09/14/2018    He has been working on diet and exercise for prediabetes, and denies polydipsia and polyuria. Last A1C in the office was:  Lab Results  Component Value Date   HGBA1C 5.1 09/14/2018   Patient is on Vitamin D supplement.   Lab Results  Component Value Date   VD25OH 61 09/14/2018        Current Medications:  Current Outpatient Medications on File Prior to Visit  Medication Sig  . albuterol (PROVENTIL HFA;VENTOLIN HFA) 108 (90 Base) MCG/ACT inhaler Inhale 1 puff into the lungs every 6 (six) hours as needed for wheezing or shortness of breath.  . Cholecalciferol (VITAMIN D3 PO) Take 6,000 Units by mouth daily.   . fluticasone (FLONASE) 50 MCG/ACT nasal spray Place 2 sprays into both nostrils as needed for allergies.  . Magnesium 400 MG CAPS Take 1 tablet by mouth daily.  . metoprolol tartrate (LOPRESSOR) 25 MG tablet Take 0.5 tablets (12.5 mg total) by mouth 2 (two) times daily.  [name] Kitchen OVER THE COUNTER MEDICATION  Takes One a Day Men's Health formula MVI 1 daily.  . verapamil (CALAN-SR) 120 MG CR tablet Take 1 tablet daily with food for Palpitations   No current facility-administered medications on file prior to visit.      Allergies:  Allergies  Allergen Reactions  . Shellfish Allergy Nausea Only  . Iodine Other (See Comments)    Unknown Unknown Allergic to shellfish - nausea     Medical History:  Past Medical History:  Diagnosis Date  . Allergy   . Asthma   . Diverticulitis   . Diverticulitis 09/17/2015  . Essential hypertension   . Hemorrhoid   . Hyperlipidemia   . Kidney stones   . Mild dilation of ascending aorta (HCC)   . Obesity   . Obstructive sleep apnea 10/16/2010   Qualifier: Diagnosis of  By: Gwenette Greet MD, Armando Reichert  Not on CPAP    .  Seasonal allergies 08/24/2013  . Swollen lymph nodes    abd, groin,    Family history- Reviewed and unchanged Social history- Reviewed and unchanged   Review of Systems:  Review of Systems  Constitutional: Negative for chills, diaphoresis, fever, malaise/fatigue and weight loss.  HENT: Negative for congestion, ear discharge, ear pain, hearing loss, nosebleeds, sinus pain, sore throat and tinnitus.   Eyes: Negative for blurred vision, double vision, photophobia, pain, discharge and redness.  Respiratory: Negative for cough, hemoptysis, sputum production, shortness of breath, wheezing and stridor.   Cardiovascular: Negative for chest pain, palpitations, orthopnea, claudication, leg swelling and PND.  Gastrointestinal: Negative for abdominal pain, blood in stool, constipation, diarrhea, heartburn, melena, nausea and vomiting.  Genitourinary: Negative for dysuria, flank pain, frequency, hematuria and urgency.  Musculoskeletal: Negative for back pain, falls, joint pain, myalgias and neck pain.  Skin: Negative for itching and rash.  Neurological: Negative for dizziness, tingling, tremors, sensory change, speech change, focal weakness, seizures,  loss of consciousness, weakness and headaches.  Endo/Heme/Allergies: Negative for environmental allergies and polydipsia. Does not bruise/bleed easily.  Psychiatric/Behavioral: Positive for depression. Negative for hallucinations, memory loss, substance abuse and suicidal ideas. The patient is not nervous/anxious and does not have insomnia.        Reports improving and the stressors for this are resolving.      Physical Exam: BP 120/64   Pulse 78   Temp (!) 97.4 F (36.3 C)   Resp 16   Ht 6\' 1"  (1.854 m)   Wt 259 lb (117.5 kg)   SpO2 96%   BMI 34.17 kg/m  Wt Readings from Last 3 Encounters:  09/15/18 256 lb (116.1 kg)  09/14/18 259 lb (117.5 kg)  06/15/18 246 lb 9.6 oz (111.9 kg)   General Appearance: Well nourished, in no apparent distress. Eyes: PERRLA, EOMs, conjunctiva no swelling or erythema Sinuses: No Frontal/maxillary tenderness ENT/Mouth: Ext aud canals clear, TMs without erythema, bulging. No erythema, swelling, or exudate on post pharynx.  Tonsils not swollen or erythematous. Hearing normal.  Neck: Supple, thyroid normal.  Respiratory: Respiratory effort normal, BS equal bilaterally without rales, rhonchi, wheezing or stridor.  Cardio: RRR with no MRGs. Brisk peripheral pulses without edema.  Abdomen: Soft, + BS.  Non tender, no guarding, rebound, hernias, masses. Lymphatics: Non tender without lymphadenopathy.  Musculoskeletal: Full ROM, 5/5 strength, Normal gait Skin: Warm, dry without rashes, lesions, ecchymosis.  Neuro: Cranial nerves intact. No cerebellar symptoms.  Psych: Awake and oriented X 3, normal affect, Insight and Judgment appropriate.     Names of Other Physician/Practitioners you currently use: 1. [facility] here for primary care 2. Eye exam 2019 3.Dentist 2019  Patient Care Team: [name], MD as PCP - General (Internal Medicine) Nahser, Wonda Cheng, MD as PCP - Cardiology (Cardiology) Druscilla Brownie, MD as Consulting Physician (Dermatology) Richmond Campbell, MD as Consulting Physician (Gastroenterology) Monna Fam, MD as Consulting Physician (Ophthalmology) Nahser, Wonda Cheng, MD as Consulting Physician (Cardiology)    Screening Tests: Immunization History  Administered Date(s) Administered  . DTaP 08/25/2011  . Influenza Inj Mdck Quad With Preservative 08/19/2017, 09/14/2018  . Influenza Whole 08/29/2013    Preventative care: Last colonoscopy: 2017, Due 2027 CXR: 2016   Vaccinations: TD or Tdap: 2021  Influenza: 2019  Pneumococcal: N/A Prevnar13: N/A Shingles/Zostavax: Discussed    Garnet Sierras, NP 2:22 PM [facility]

## 2018-09-14 NOTE — Patient Instructions (Signed)
Celexa, since you are taking 1/2 a tablet.  You make take half a tablet every other day for one week, then every two days ( 1/2 tablet twice a week) then stop.  Will contact you with lab results  We will forward labs to Cardiology  Included information about celiac diet  Follow up in 6 months for labs and physical   Gluten-Free Diet for Celiac Disease, Adult The gluten-free diet includes all foods that do not contain gluten. Gluten is a protein that is found in wheat, rye, barley, and some other grains. Following the gluten-free diet is the only treatment for people with celiac disease. It helps to prevent damage to the intestines and improves or eliminates the symptoms of celiac disease. Following the gluten-free diet requires some planning. It can be challenging at first, but it gets easier with time and practice. There are more gluten-free options available today than ever before. If you need help finding gluten-free foods or if you have questions, talk with your diet and nutrition specialist (registered dietitian) or your health care provider. What do I need to know about a gluten-free diet?  All fruits, vegetables, and meats are safe to eat and do not contain gluten.  When grocery shopping, start by shopping in the produce, meat, and dairy sections. These sections are more likely to contain gluten-free foods. Then move to the aisles that contain packaged foods if you need to.  Read all food labels. Gluten is often added to foods. Always check the ingredient list and look for warnings, such as "may contain gluten."  Talk with your dietitian or health care provider before taking a gluten-free multivitamin or mineral supplement.  Be aware of gluten-free foods having contact with foods that contain gluten (cross-contamination). This can happen at home and with any processed foods. ? Talk with your health care provider or dietitian about how to reduce the risk of cross-contamination in your  home. ? If you have questions about how a food is processed, ask the manufacturer. What key words help to identify gluten? Foods that list any of these key words on the label usually contain gluten:  Wheat, flour, enriched flour, bromated flour, white flour, durum flour, graham flour, phosphated flour, self-rising flour, semolina, farina, barley (malt), rye, and oats.  Starch, dextrin, modified food starch, or cereal.  Thickening, fillers, or emulsifiers.  Malt flavoring, malt extract, or malt syrup.  Hydrolyzed vegetable protein.  In the [place] packaged foods that are gluten-free are required to be labeled "GF." These foods should be easy to identify and are safe to eat. In the [place] food companies are also required to list common food allergens, including wheat, on their labels. Recommended foods Grains  Amaranth, bean flours, 100% buckwheat flour, corn, millet, nut flours or nut meals, GF oats, quinoa, rice, sorghum, teff, rice wafers, pure cornmeal tortillas, popcorn, and hot cereals made from cornmeal. Hominy, rice, wild rice. Some Asian rice noodles or bean noodles. Arrowroot starch, corn bran, corn flour, corn germ, cornmeal, corn starch, potato flour, potato starch flour, and rice bran. Plain, brown, and sweet rice flours. Rice polish, soy flour, and tapioca starch. Vegetables  All plain fresh, frozen, and canned vegetables. Fruits  All plain fresh, frozen, canned, and dried fruits, and 100% fruit juices. Meats and other protein foods  All fresh beef, pork, poultry, fish, seafood, and eggs. Fish canned in water, oil, brine, or vegetable broth. Plain nuts and seeds, peanut butter. Some lunch meat and some frankfurters. Dried beans,  dried peas, and lentils. Dairy  Fresh plain, dry, evaporated, or condensed milk. Cream, butter, sour cream, whipping cream, and most yogurts. Unprocessed cheese, most processed cheeses, some cottage cheese, some cream cheeses. Beverages  Coffee,  tea, most herbal teas. Carbonated beverages and some root beers. Wine, sake, and distilled spirits, such as gin, vodka, and whiskey. Most hard ciders. Fats and oils  Butter, margarine, vegetable oil, hydrogenated butter, olive oil, shortening, lard, cream, and some mayonnaise. Some commercial salad dressings. Olives. Sweets and desserts  Sugar, honey, some syrups, molasses, jelly, and jam. Plain hard candy, marshmallows, and gumdrops. Pure cocoa powder. Plain chocolate. Custard and some pudding mixes. Gelatin desserts, sorbets, frozen ice pops, and sherbet. Cake, cookies, and other desserts prepared with allowed flours. Some commercial ice creams. Cornstarch, tapioca, and rice puddings. Seasoning and other foods  Some canned or frozen soups. Monosodium glutamate (MSG). Cider, rice, and wine vinegar. Baking soda and baking powder. Cream of tartar. Baking and nutritional yeast. Certain soy sauces made without wheat (ask your dietitian about specific brands that are allowed). Nuts, coconut, and chocolate. Salt, pepper, herbs, spices, flavoring extracts, imitation or artificial flavorings, natural flavorings, and food colorings. Some medicines and supplements. Some lip glosses and other cosmetics. Rice syrups. The items listed may not be a complete list. Talk with your dietitian about what dietary choices are best for you. Foods to avoid Grains  Barley, bran, bulgur, couscous, cracked wheat, [place], farro, graham, malt, matzo, semolina, wheat germ, and all wheat and rye cereals including spelt and kamut. Cereals containing malt as a flavoring, such as rice cereal. Noodles, spaghetti, macaroni, most packaged rice mixes, and all mixes containing wheat, rye, barley, or triticale. Vegetables  Most creamed vegetables and most vegetables canned in sauces. Some commercially prepared vegetables and salads. Fruits  Thickened or prepared fruits and some pie fillings. Some fruit snacks and fruit  roll-ups. Meats and other protein foods  Any meat or meat alternative containing wheat, rye, barley, or gluten stabilizers. These are often marinated or packaged meats and lunch meats. Bread-containing products, such as Swiss steak, croquettes, meatballs, and meatloaf. Most tuna canned in vegetable broth and [place] with hydrolyzed vegetable protein (HVP) injected as part of the basting. Seitan. Imitation fish. Eggs in sauces made from ingredients to avoid. Dairy  Commercial chocolate milk drinks and malted milk. Some non-dairy creamers. Any cheese product containing ingredients to avoid. Beverages  Certain cereal beverages. Beer, ale, malted milk, and some root beers. Some hard ciders. Some instant flavored coffees. Some herbal teas made with barley or with barley malt added. Fats and oils  Some commercial salad dressings. Sour cream containing modified food starch. Sweets and desserts  Some toffees. Chocolate-coated nuts (may be rolled in wheat flour) and some commercial candies and candy bars. Most cakes, cookies, donuts, pastries, and other baked goods. Some commercial ice cream. Ice cream cones. Commercially prepared mixes for cakes, cookies, and other desserts. Bread pudding and other puddings thickened with flour. Products containing brown rice syrup made with barley malt enzyme. Desserts and sweets made with malt flavoring. Seasoning and other foods  Some curry powders, some dry seasoning mixes, some gravy extracts, some meat sauces, some ketchups, some prepared mustards, and horseradish. Certain soy sauces. Malt vinegar. Bouillon and bouillon cubes that contain HVP. Some chip dips, and some chewing gum. Yeast extract. Brewer's yeast. Caramel color. Some medicines and supplements. Some lip glosses and other cosmetics. The items listed may not be a complete list. Talk with your dietitian  about what dietary choices are best for you. Summary  Gluten is a protein that is found in wheat, rye,  barley, and some other grains. The gluten-free diet includes all foods that do not contain gluten.  If you need help finding gluten-free foods or if you have questions, talk with your diet and nutrition specialist (registered dietitian) or your health care provider.  Read all food labels. Gluten is often added to foods. Always check the ingredient list and look for warnings, such as "may contain gluten." This information is not intended to replace advice given to you by your health care provider. Make sure you discuss any questions you have with your health care provider. Document Released: 10/27/2005 Document Revised: 08/11/2016 Document Reviewed: 08/11/2016 Elsevier Interactive Patient Education  2018 [employer].

## 2018-09-15 ENCOUNTER — Ambulatory Visit: Payer: 59 | Admitting: Cardiovascular Disease

## 2018-09-15 ENCOUNTER — Encounter: Payer: Self-pay | Admitting: Cardiovascular Disease

## 2018-09-15 VITALS — BP 102/70 | HR 67 | Ht 73.0 in | Wt 256.0 lb

## 2018-09-15 DIAGNOSIS — I10 Essential (primary) hypertension: Secondary | ICD-10-CM

## 2018-09-15 DIAGNOSIS — I493 Ventricular premature depolarization: Secondary | ICD-10-CM | POA: Diagnosis not present

## 2018-09-15 DIAGNOSIS — E785 Hyperlipidemia, unspecified: Secondary | ICD-10-CM

## 2018-09-15 MED ORDER — ROSUVASTATIN CALCIUM 5 MG PO TABS: 5.0000 mg | ORAL_TABLET | Freq: Every day | ORAL | 3 refills | Status: DC

## 2018-09-15 NOTE — Progress Notes (Signed)
Cardiology Office Note    Date:  09/15/2018  ID:  VIRAAJ VORNDRAN, DOB 12/02/1960, MRN [id number] PCP:  [name], MD  Cardiologist:  New, Dr. Acie Fredrickson    Chief Complaint: palpitations       Previous notes per [name], PA   David Fuller  is a 57 y.o. male with history of asthma, HTN, HLD, obesity, suspected OSA, seasonal allergies, diverticulitis who is being seen today for the evaluation of palpitations at the request of Dr. Rogene Houston (EDP).  He has no prior cardiac history. He was previously told he had suspected sleep apnea due to an overnight pulse ox test in the past but never had a formal sleep study due to some technical issues with scheduling with pulmonology. In June of this year he noticed sensation of palpitations following a temporary increase in caffeine intake (6 drinks per day). He quit caffeine cold [place] and these resolved. In early September he noticed about 3 days of recurrent palpitations described as skips/flips that felt like a fullness in his throat. This began shortly after he had worked outside in the extreme heat - he thinks he had lost a lot of fluid in sweat. Due to persistent symptoms he was seen in the ED 07/19/17 where he was found to have rare PVCs. Troponin was negative. CBC wnl, BMET showed K 4.7, Cr 0.93, glucose 116. Remote MG/TSH 01/2017 wnl and LDL 146. He was advised to establish care with cardiology. He continues to have occasional palpitations most days of the week. He does not believe he's had any recent tachypalpitations. He does snore. He's been actively trying to lose weight -30lb over the last year. He wants to be healthier to be around for his 2.5-yea-old grandson. He also walks daily and rides his bike every other day. He's not had any recent chest pain, dyspnea, LEE, syncope. Palpitations improve with physical exercise.  Nov. 6, 2018:  Doing better  Still having palpitations Worse with caffiene. Is under lots of stress .    Echocardiogram from October 5 shows normal left ventricular systolic function with EF 65-70%.  He has grade 1 diastolic dysfunction.  There is very minimal ascending aortic dilatation.  Normal pulmonary artery pressures. Tries to watch his salt .   Occasional hot dog.  Has lost 32 lbs over the past year .   Walks , rides his bike   Nov. 6, 2019:  Doing well Had some PVCs Tried verapamil in place of the metoprolol  Checked his electrolytes.  OSA is well controlled.  Wt was 256 lbs   Mood seems to be OK on Celexa 20 mg a day  Exercising regularly .   Joined [employer] - goes 4 times a week  No significant arrhythmias  After workouts  Has been eating salads with chicken for the past several months   Past Medical History:  Diagnosis Date  . Allergy   . Asthma   . Diverticulitis   . Diverticulitis 09/17/2015  . Essential hypertension   . Hemorrhoid   . Hyperlipidemia   . Kidney stones   . Mild dilation of ascending aorta (HCC)   . Obesity   . Obstructive sleep apnea 10/16/2010   Qualifier: Diagnosis of  By: Gwenette Greet MD, Armando Reichert  Not on CPAP    . Seasonal allergies 08/24/2013  . Swollen lymph nodes    abd, groin,     Past Surgical History:  Procedure Laterality Date  . VASECTOMY  MID 90S  Current Medications: Current Meds  Medication Sig  . albuterol (PROVENTIL HFA;VENTOLIN HFA) 108 (90 Base) MCG/ACT inhaler Inhale 1 puff into the lungs every 6 (six) hours as needed for wheezing or shortness of breath.  . Cholecalciferol (VITAMIN D3 PO) Take 6,000 Units by mouth daily.   . citalopram (CELEXA) 40 MG tablet Take 20 mg by mouth daily.  . fluticasone (FLONASE) 50 MCG/ACT nasal spray Place 2 sprays into both nostrils as needed for allergies.  . Magnesium 400 MG CAPS Take 1 tablet by mouth daily.  . metoprolol tartrate (LOPRESSOR) 25 MG tablet Take 0.5 tablets (12.5 mg total) by mouth 2 (two) times daily.  [name] Kitchen OVER THE COUNTER MEDICATION Takes One a Day Men's Health formula MVI  1 daily.  . verapamil (CALAN-SR) 120 MG CR tablet Take 1 tablet daily with food for Palpitations     Allergies:   Shellfish allergy and Iodine   Social History   Socioeconomic History  . Marital status: Married    Spouse name: Not on file  . Number of children: Not on file  . Years of education: Not on file  . Highest education level: Not on file  Occupational History  . Not on file  Social Needs  . Financial resource strain: Not on file  . Food insecurity:    Worry: Not on file    Inability: Not on file  . Transportation needs:    Medical: Not on file    Non-medical: Not on file  Tobacco Use  . Smoking status: Never Smoker  . Smokeless tobacco: Former Systems developer    Types: Chew  Substance and Sexual Activity  . Alcohol use: Yes    Alcohol/week: 1.0 - 2.0 standard drinks    Types: 1 - 2 Cans of beer per week    Comment: 2OZ A WEEK  . Drug use: No  . Sexual activity: Yes  Lifestyle  . Physical activity:    Days per week: Not on file    Minutes per session: Not on file  . Stress: Not on file  Relationships  . Social connections:    Talks on phone: Not on file    Gets together: Not on file    Attends religious service: Not on file    Active member of club or organization: Not on file    Attends meetings of clubs or organizations: Not on file    Relationship status: Not on file  Other Topics Concern  . Not on file  Social History Narrative  . Not on file     Family History:  Family History  Problem Relation Age of Onset  . Diabetes Father   . Heart disease Father   . Heart failure Father   . Cancer Brother        Melanoma- left arm  . Atrial fibrillation Mother   . Stroke Mother     ROS:   Please see the history of present illness.  All other systems are reviewed and otherwise negative.   Physical Exam: Blood pressure 102/70, pulse 67, height 6\' 1"  (1.854 m), weight 256 lb (116.1 kg), SpO2 95 %.  GEN:  Well nourished, well developed in no acute  distress HEENT: Normal NECK: No JVD; No carotid bruits LYMPHATICS: No lymphadenopathy CARDIAC: RRR   RESPIRATORY:  Clear to auscultation without rales, wheezing or rhonchi  ABDOMEN: Soft, non-tender, non-distended MUSCULOSKELETAL:  No edema; No deformity  SKIN: Warm and dry NEUROLOGIC:  Alert and oriented x 3  Wt Readings from Last 3 Encounters:  09/15/18 256 lb (116.1 kg)  09/14/18 259 lb (117.5 kg)  06/15/18 246 lb 9.6 oz (111.9 kg)      Studies/Labs Reviewed:   EKG:   Recent Labs: 06/15/2018: TSH 2.26 09/14/2018: ALT 19; ALT 19; BUN 16; Creat 1.17; Hemoglobin 15.6; Magnesium 2.0; Platelets 271; Potassium 4.0; Sodium 139   Lipid Panel    Component Value Date/Time   CHOL 218 (H) 09/14/2018 1601   TRIG 179 (H) 09/14/2018 1601   HDL 48 09/14/2018 1601   CHOLHDL 4.5 09/14/2018 1601   VLDL 18 01/13/2017 1445   LDLCALC 139 (H) 09/14/2018 1601    Additional studies/ records that were reviewed today include: Summarized above.   ASSESSMENT & PLAN:    1. Palpitations -these have been well controlled on very low-dose metoprolol and verapamil 120 mg slow release each day.  He wants to try to come off the medications.  I think that he is doing very well on the current medications and that he should to new but if he wants to stop the verapamil or the metoprolol is given him the okay.  2. Essential HTN -blood pressure is been on the low side.  He denies any symptoms.  3. Hyperlipidemia -   Will start Crestor 5 mg a day  Check labs in 3 months  4. Snoring/suspected OSA-  Wears his CPAP    Will see him in  1 year     Medication Adjustments/Labs and Tests Ordered: Current medicines are reviewed at length with the patient today.  Concerns regarding medicines are outlined above. Medication changes, Labs and Tests ordered today are summarized above and listed in the Patient Instructions accessible in Encounters.   Signed, Mertie Moores, MD  09/15/2018 10:15 AM    [address], [place], [place]  [address] Phone: [phone]; Fax: [phone]

## 2018-09-15 NOTE — Patient Instructions (Addendum)
Medication Instructions:  Your physician has recommended you make the following change in your medication:   START Rosuvastatin (Crestor) 5 mg once daily  If you need a refill on your cardiac medications before your next appointment, please call your pharmacy.    Lab work: Your physician recommends that you return for lab work in: 3 months You will need to FAST for this appointment - nothing to eat or drink after midnight the night before except water.    Testing/Procedures: None Ordered    Follow-Up: At [facility], you and your health needs are our priority.  As part of our continuing mission to provide you with exceptional heart care, we have created designated Provider Care Teams.  These Care Teams include your primary Cardiologist (physician) and Advanced Practice Providers (APPs -  Physician Assistants and Nurse Practitioners) who all work together to provide you with the care you need, when you need it. You will need a follow up appointment in:  1 year.  Please call our office 2 months in advance to schedule this appointment.  You may see Mertie Moores, MD or one of the following Advanced Practice Providers on your designated Care Team: Richardson Dopp, PA-C [place], [place] . Daune Perch, NP

## 2018-09-16 ENCOUNTER — Encounter: Payer: Self-pay | Admitting: Adult Health Nurse Practitioner

## 2018-09-16 DIAGNOSIS — F325 Major depressive disorder, single episode, in full remission: Secondary | ICD-10-CM | POA: Insufficient documentation

## 2018-09-16 LAB — CBC WITH DIFFERENTIAL/PLATELET
Basophils Absolute: 59 cells/uL (ref 0–200)
Basophils Relative: 0.7 %
Eosinophils Absolute: 378 cells/uL (ref 15–500)
Eosinophils Relative: 4.5 %
HCT: 45 % (ref 38.5–50.0)
Hemoglobin: 15.6 g/dL (ref 13.2–17.1)
Lymphs Abs: 1680 cells/uL (ref 850–3900)
MCH: 31.1 pg (ref 27.0–33.0)
MCHC: 34.7 g/dL (ref 32.0–36.0)
MCV: 89.8 fL (ref 80.0–100.0)
MPV: 9.5 fL (ref 7.5–12.5)
Monocytes Relative: 9.7 %
Neutro Abs: 5468 {cells}/uL (ref 1500–7800)
Neutrophils Relative %: 65.1 %
Platelets: 271 Thousand/uL (ref 140–400)
RBC: 5.01 Million/uL (ref 4.20–5.80)
RDW: 13 % (ref 11.0–15.0)
Total Lymphocyte: 20 %
WBC mixed population: 815 {cells}/uL (ref 200–950)
WBC: 8.4 Thousand/uL (ref 3.8–10.8)

## 2018-09-16 LAB — LIPID PANEL
Cholesterol: 218 mg/dL — ABNORMAL HIGH (ref <200)
HDL: 48 mg/dL (ref >40)
LDL Cholesterol (Calc): 139 mg/dL — ABNORMAL HIGH
Non-HDL Cholesterol (Calc): 170 mg/dL — ABNORMAL HIGH (ref <130)
Total CHOL/HDL Ratio: 4.5 (calc) (ref <5.0)
Triglycerides: 179 mg/dL — ABNORMAL HIGH (ref <150)

## 2018-09-16 LAB — COMPLETE METABOLIC PANEL WITH GFR
AG Ratio: 2 (calc) (ref 1.0–2.5)
AST: 21 U/L (ref 10–35)
Albumin: 4.3 g/dL (ref 3.6–5.1)
Alkaline phosphatase (APISO): 62 U/L (ref 40–115)
CO2: 26 mmol/L (ref 20–32)
Chloride: 106 mmol/L (ref 98–110)
Creat: 1.17 mg/dL (ref 0.70–1.33)
GFR, Est African American: 80 mL/min/{1.73_m2} (ref >=60)
Globulin: 2.2 g/dL (calc) (ref 1.9–3.7)
Potassium: 4 mmol/L (ref 3.5–5.3)
Sodium: 139 mmol/L (ref 135–146)
Total Bilirubin: 0.8 mg/dL (ref 0.2–1.2)
Total Protein: 6.5 g/dL (ref 6.1–8.1)

## 2018-09-16 LAB — INSULIN, RANDOM: Insulin: 14.8 u[IU]/mL (ref 2.0–19.6)

## 2018-09-16 LAB — ALLERGY PANEL 15, CEREAL GROUP
Allergen Barley f6: 0.83 kU/L — ABNORMAL HIGH
Allergen, Rice, f9: 1.1 kU/L — ABNORMAL HIGH
Allergen, Rye, f5: 0.8 kU/L — ABNORMAL HIGH
Buckwheat (f11) IgE: 0.88 kU/L — ABNORMAL HIGH
CLASS: 1
CLASS: 2
CLASS: 2
CLASS: 2
Class F11: 2
Gluten f79: 0.37 kU/L — ABNORMAL HIGH

## 2018-09-16 LAB — HEPATIC FUNCTION PANEL
AG Ratio: 2 (calc) (ref 1.0–2.5)
ALT: 19 U/L (ref 9–46)
AST: 21 U/L (ref 10–35)
Albumin: 4.3 g/dL (ref 3.6–5.1)
Alkaline phosphatase (APISO): 62 U/L (ref 40–115)
Bilirubin, Direct: 0.2 mg/dL (ref 0.0–0.2)
Globulin: 2.2 g/dL (calc) (ref 1.9–3.7)
Indirect Bilirubin: 0.6 mg/dL (calc) (ref 0.2–1.2)
Total Bilirubin: 0.8 mg/dL (ref 0.2–1.2)
Total Protein: 6.5 g/dL (ref 6.1–8.1)

## 2018-09-16 LAB — COMPLETE METABOLIC PANEL WITHOUT GFR
ALT: 19 U/L (ref 9–46)
BUN: 16 mg/dL (ref 7–25)
Calcium: 9.7 mg/dL (ref 8.6–10.3)
GFR, Est Non African American: 69 mL/min/1.73m2 (ref >=60)
Glucose, Bld: 76 mg/dL (ref 65–99)

## 2018-09-16 LAB — TEST AUTHORIZATION

## 2018-09-16 LAB — CELIAC DISEASE COMPREHENSIVE PANEL WITH REFLEXES
(tTG) Ab, IgA: 1 U/mL
Immunoglobulin A: 202 mg/dL (ref 47–310)

## 2018-09-16 LAB — MAGNESIUM: Magnesium: 2 mg/dL (ref 1.5–2.5)

## 2018-09-16 LAB — VITAMIN D 25 HYDROXY (VIT D DEFICIENCY, FRACTURES): Vit D, 25-Hydroxy: 61 ng/mL (ref 30–100)

## 2018-09-16 LAB — INTERPRETATION:

## 2018-09-16 LAB — HEMOGLOBIN A1C
Hgb A1c MFr Bld: 5.1 % of total Hgb (ref <5.7)
Mean Plasma Glucose: 100 (calc)
eAG (mmol/L): 5.5 (calc)

## 2018-09-20 ENCOUNTER — Other Ambulatory Visit: Payer: Self-pay | Admitting: Internal Medicine

## 2018-10-02 DIAGNOSIS — G4733 Obstructive sleep apnea (adult) (pediatric): Secondary | ICD-10-CM | POA: Diagnosis not present

## 2018-10-08 DIAGNOSIS — J40 Bronchitis, not specified as acute or chronic: Secondary | ICD-10-CM | POA: Diagnosis not present

## 2018-10-08 DIAGNOSIS — J45909 Unspecified asthma, uncomplicated: Secondary | ICD-10-CM | POA: Diagnosis not present

## 2018-11-01 DIAGNOSIS — G4733 Obstructive sleep apnea (adult) (pediatric): Secondary | ICD-10-CM | POA: Diagnosis not present

## 2018-11-26 DIAGNOSIS — G4733 Obstructive sleep apnea (adult) (pediatric): Secondary | ICD-10-CM | POA: Diagnosis not present

## 2018-12-02 DIAGNOSIS — G4733 Obstructive sleep apnea (adult) (pediatric): Secondary | ICD-10-CM | POA: Diagnosis not present

## 2018-12-10 ENCOUNTER — Other Ambulatory Visit: Payer: Self-pay | Admitting: Internal Medicine

## 2018-12-10 DIAGNOSIS — R002 Palpitations: Secondary | ICD-10-CM

## 2018-12-16 ENCOUNTER — Other Ambulatory Visit: Payer: 59

## 2018-12-16 DIAGNOSIS — E785 Hyperlipidemia, unspecified: Secondary | ICD-10-CM

## 2018-12-16 DIAGNOSIS — I493 Ventricular premature depolarization: Secondary | ICD-10-CM | POA: Diagnosis not present

## 2018-12-16 DIAGNOSIS — I10 Essential (primary) hypertension: Secondary | ICD-10-CM | POA: Diagnosis not present

## 2018-12-16 LAB — HEPATIC FUNCTION PANEL
ALT: 25 IU/L (ref 0–44)
AST: 25 IU/L (ref 0–40)
Albumin: 4.3 g/dL (ref 3.8–4.9)
Alkaline Phosphatase: 59 IU/L (ref 39–117)
BILIRUBIN TOTAL: 0.8 mg/dL (ref 0.0–1.2)
Bilirubin, Direct: 0.2 mg/dL (ref 0.00–0.40)
Total Protein: 6.5 g/dL (ref 6.0–8.5)

## 2018-12-16 LAB — BASIC METABOLIC PANEL
BUN/Creatinine Ratio: 16 (ref 9–20)
BUN: 15 mg/dL (ref 6–24)
CO2: 23 mmol/L (ref 20–29)
Calcium: 9.5 mg/dL (ref 8.7–10.2)
Chloride: 101 mmol/L (ref 96–106)
Creatinine, Ser: 0.96 mg/dL (ref 0.76–1.27)
GFR calc non Af Amer: 87 mL/min/{1.73_m2} (ref >59)
GFR, EST AFRICAN AMERICAN: 100 mL/min/{1.73_m2} (ref >59)
Glucose: 95 mg/dL (ref 65–99)
Potassium: 4.5 mmol/L (ref 3.5–5.2)
Sodium: 140 mmol/L (ref 134–144)

## 2018-12-16 LAB — LIPID PANEL
CHOL/HDL RATIO: 3.3 ratio (ref 0.0–5.0)
CHOLESTEROL TOTAL: 165 mg/dL (ref 100–199)
HDL: 50 mg/dL (ref >39)
LDL Calculated: 103 mg/dL — ABNORMAL HIGH (ref 0–99)
Triglycerides: 60 mg/dL (ref 0–149)
VLDL Cholesterol Cal: 12 mg/dL (ref 5–40)

## 2018-12-21 ENCOUNTER — Encounter: Payer: Self-pay | Admitting: Adult Health

## 2018-12-21 ENCOUNTER — Ambulatory Visit (INDEPENDENT_AMBULATORY_CARE_PROVIDER_SITE_OTHER): Payer: 59 | Admitting: Adult Health

## 2018-12-21 VITALS — BP 130/80 | HR 79 | Temp 97.9°F | Ht 73.0 in | Wt 278.0 lb

## 2018-12-21 DIAGNOSIS — J069 Acute upper respiratory infection, unspecified: Secondary | ICD-10-CM

## 2018-12-21 DIAGNOSIS — Z20828 Contact with and (suspected) exposure to other viral communicable diseases: Secondary | ICD-10-CM

## 2018-12-21 LAB — POC INFLUENZA A&B (BINAX/QUICKVUE)
Influenza A, POC: NEGATIVE
Influenza B, POC: NEGATIVE

## 2018-12-21 MED ORDER — AZITHROMYCIN 250 MG PO TABS: ORAL_TABLET | ORAL | 1 refills | Status: AC

## 2018-12-21 MED ORDER — PROMETHAZINE-DM 6.25-15 MG/5ML PO SYRP: 5.0000 mL | ORAL_SOLUTION | Freq: Four times a day (QID) | ORAL | 1 refills | Status: DC | PRN

## 2018-12-21 MED ORDER — PREDNISONE 20 MG PO TABS: ORAL_TABLET | ORAL | 0 refills | Status: DC

## 2018-12-21 NOTE — Patient Instructions (Addendum)
Hold off on zpak unless cough is increasingly productive, have fever, shortness of breath  Recommend starting on allergy medication, will do prednisone and symptomatic treatment  Call back if wheezing more, using albuterol more than once or twice a day - will send in breo    HOW TO TREAT VIRAL COUGH AND COLD SYMPTOMS:  -Symptoms usually last at least 1 week with the worst symptoms being around day 4.  - colds usually start with a sore throat and end with a cough, and the cough can take 2 weeks to get better.  -No antibiotics are needed for colds, flu, sore throats, cough, bronchitis UNLESS symptoms are longer than 7 days OR if you are getting better then get drastically worse.  -There are a lot of combination medications (Dayquil, Nyquil, Vicks 44, tyelnol cold and sinus, ETC). Please look at the ingredients on the back so that you are treating the correct symptoms and not doubling up on medications/ingredients.    Medicines you can use  Nasal congestion  Little Remedies saline spray (aerosol/mist)- can try this, it is in the kids section - pseudoephedrine (Sudafed)- behind the counter, do not use if you have high blood pressure, medicine that have -D in them.  - phenylephrine (Sudafed PE) -Dextormethorphan + chlorpheniramine (Coridcidin HBP)- okay if you have high blood pressure -Oxymetazoline (Afrin) nasal spray- LIMIT to 3 days -Saline nasal spray -Neti pot (used distilled or bottled water)  Ear pain/congestion  -pseudoephedrine (sudafed) - Nasonex/flonase nasal spray  Fever  -Acetaminophen (Tyelnol) -Ibuprofen (Advil, motrin, aleve)  Sore Throat  -Acetaminophen (Tyelnol) -Ibuprofen (Advil, motrin, aleve) -Drink a lot of water -Gargle with salt water - Rest your voice (don't talk) -Throat sprays -Cough drops  Body Aches  -Acetaminophen (Tyelnol) -Ibuprofen (Advil, motrin, aleve)  Headache  -Acetaminophen (Tyelnol) -Ibuprofen (Advil, motrin, aleve) - Exedrin,  Exedrin Migraine  Allergy symptoms (cough, sneeze, runny nose, itchy eyes) -Claritin or loratadine cheapest but likely the weakest  -Zyrtec or certizine at night because it can make you sleepy -The strongest is allegra or fexafinadine  Cheapest at [employer], [employer], [employer]  Cough  -Dextromethorphan (Delsym)- medicine that has DM in it -Guafenesin (Mucinex/Robitussin) - cough drops - drink lots of water  Chest Congestion  -Guafenesin (Mucinex/Robitussin)  Red Itchy Eyes  - Naphcon-A  Upset Stomach  - Bland diet (nothing spicy, greasy, fried, and high acid foods like tomatoes, oranges, berries) -OKAY- cereal, bread, soup, crackers, rice -Eat smaller more frequent meals -reduce caffeine, no alcohol -Loperamide (Imodium-AD) if diarrhea -Prevacid for heart burn  General health when sick  -Hydration -wash your hands frequently -keep surfaces clean -change pillow cases and sheets often -Get fresh air but do not exercise strenuously -Vitamin D, double up on it - Vitamin C -Zinc

## 2018-12-21 NOTE — Progress Notes (Signed)
Assessment and Plan:  David Fuller was seen today for acute visit.  Diagnoses and all orders for this visit:  Upper respiratory tract infection, unspecified type/Exposure to influenza Benign exam; r/o influenza due to exposure and strong patient preference Discussed the importance of avoiding unnecessary antibiotic therapy. Suggested symptomatic OTC remedies. Nasal saline spray for congestion. Nasal steroids, allergy pill, oral steroids offered Follow up as needed. -     POC Influenza A&B(BINAX/QUICKVUE) - neg -     predniSONE (DELTASONE) 20 MG tablet; 2 tablets daily for 3 days, 1 tablet daily for 4 days. -     promethazine-dextromethorphan (PROMETHAZINE-DM) 6.25-15 MG/5ML syrup; Take 5 mLs by mouth 4 (four) times daily as needed for cough.  Printed to hold and fill only for progressive symptoms, fever, increasingly productive cough:  -     azithromycin (ZITHROMAX) 250 MG tablet; Take 2 tablets (500 mg) on  Day 1,  followed by 1 tablet (250 mg) once daily on Days 2 through 5.   Further disposition pending results of labs. Discussed med's effects and SE's.   Over 15 minutes of exam, counseling, chart review, and critical decision making was performed.   Future Appointments  Date Time Provider [place]  01/19/2019 10:00 AM Vicie Mutters, PA-C [id number] None    ------------------------------------------------------------------------------------------------------------------   HPI BP 130/80   Pulse 79   Temp 97.9 F (36.6 C)   Ht 6\' 1"  (1.854 m)   Wt 278 lb (126.1 kg)   SpO2 96%   BMI 36.68 kg/m   58 y.o.male with hx of mild intermittent asthma presents for evaluation of URI that began with some wheezing yesterday, took albuterol neb which resolved wheezing yesterday, but then woke up this AM with draining nose, cough, hoarseness, starting to have productive cough. He is concerned as he has been around his daughter who had influenza 2 weeks ago. He did have flu  vaccine  Denies headache, fever/chills, CP, dyspnea,   He has taken ibuprofen this AM for sore throat. He uses flonase for allergy symptoms and takes an unknown OTC allergy med PRN but hasn't been taking this.    Past Medical History:  Diagnosis Date  . Allergy   . Asthma   . Diverticulitis   . Diverticulitis 09/17/2015  . Essential hypertension   . Hemorrhoid   . Hyperlipidemia   . Kidney stones   . Mild dilation of ascending aorta (HCC)   . Obesity   . Obstructive sleep apnea 10/16/2010   Qualifier: Diagnosis of  By: Gwenette Greet MD, Armando Reichert  Not on CPAP    . Seasonal allergies 08/24/2013  . Swollen lymph nodes    abd, groin,      Allergies  Allergen Reactions  . Shellfish Allergy Nausea Only  . Iodine Other (See Comments)    Unknown Unknown Allergic to shellfish - nausea    Current Outpatient Medications on File Prior to Visit  Medication Sig  . albuterol (PROVENTIL HFA;VENTOLIN HFA) 108 (90 Base) MCG/ACT inhaler Inhale 1 puff into the lungs every 6 (six) hours as needed for wheezing or shortness of breath.  . Cholecalciferol (VITAMIN D3 PO) Take 6,000 Units by mouth daily.   . citalopram (CELEXA) 40 MG tablet Take 20 mg by mouth daily.  . fluticasone (FLONASE) 50 MCG/ACT nasal spray Place 2 sprays into both nostrils as needed for allergies.  . Magnesium 400 MG CAPS Take 1 tablet by mouth daily.  . metoprolol tartrate (LOPRESSOR) 25 MG tablet Take 0.5 tablets (12.5 mg  total) by mouth 2 (two) times daily.  [name] Kitchen OVER THE COUNTER MEDICATION Takes One a Day Men's Health formula MVI 1 daily.  . rosuvastatin (CRESTOR) 5 MG tablet Take 1 tablet (5 mg total) by mouth daily.  . verapamil (CALAN-SR) 120 MG CR tablet TAKE 1 TABLET DAILY WITH FOOD FOR PALPITATIONS.   No current facility-administered medications on file prior to visit.     ROS: all negative except above.   Physical Exam:  BP 130/80   Pulse 79   Temp 97.9 F (36.6 C)   Ht 6\' 1"  (1.854 m)   Wt 278 lb (126.1 kg)    SpO2 96%   BMI 36.68 kg/m   General Appearance: Well nourished, in no apparent distress. Eyes: PERRLA, conjunctiva no swelling or erythema Sinuses: No Frontal/maxillary tenderness ENT/Mouth: Ext aud canals clear, TMs without erythema, bulging. No erythema, swelling, or exudate on post pharynx.  Tonsils not swollen or erythematous. Hearing normal.  Neck: Supple, thyroid normal.  Respiratory: Respiratory effort normal, BS equal bilaterally without rales, rhonchi, wheezing or stridor.  Cardio: RRR with no MRGs. Brisk peripheral pulses without edema.  Abdomen: Soft, + BS.  Non tender, no guarding, rebound, hernias, masses. Lymphatics: Non tender without lymphadenopathy.  Musculoskeletal: normal gait.  Skin: Warm, dry without rashes, lesions, ecchymosis.  Neuro: Cranial nerves intact. Normal muscle tonePsych: Awake and oriented X 3, normal affect, Insight and Judgment appropriate.     Izora Ribas, NP 8:51 AM [facility]

## 2018-12-31 ENCOUNTER — Telehealth: Payer: Self-pay

## 2018-12-31 ENCOUNTER — Other Ambulatory Visit: Payer: Self-pay | Admitting: Adult Health Nurse Practitioner

## 2018-12-31 DIAGNOSIS — J40 Bronchitis, not specified as acute or chronic: Secondary | ICD-10-CM

## 2018-12-31 MED ORDER — PREDNISONE 10 MG (21) PO TBPK: ORAL_TABLET | ORAL | 0 refills | Status: DC

## 2018-12-31 NOTE — Telephone Encounter (Signed)
Patient notified

## 2018-12-31 NOTE — Telephone Encounter (Signed)
Sent in larger taper pack of prednisone.  Be sure to continue to take mucinex every 12 hours.  Increase water intake.

## 2018-12-31 NOTE — Telephone Encounter (Signed)
Patient recently seen for bronchitis. Finished the steroid antibiotic but still feels tightness in his chest. Breathing treatments not helping tightness. No breathing concerns. Patient wants to know if he needs another steroid prescription, said he usually gets a higher dose then what he received this time.

## 2019-01-02 DIAGNOSIS — G4733 Obstructive sleep apnea (adult) (pediatric): Secondary | ICD-10-CM | POA: Diagnosis not present

## 2019-01-06 ENCOUNTER — Ambulatory Visit: Payer: 59 | Admitting: Cardiovascular Disease

## 2019-01-18 NOTE — Progress Notes (Signed)
Complete Physical  Assessment and Plan: Asthma, unspecified asthma severity, uncomplicated Better with singulair, albuterol, but has cleaned out humidifer from house and doing better.   Hyperlipidemia -continue medications, check lipids, decrease fatty foods, increase activity.  - Lipid panel - EKG 12-Lead  Obstructive sleep apnea Weight loss advised Continue BiPAP  Seasonal allergies Continue medications  Encounter for general adult medical examination with abnormal findings   Essential hypertension - continue medications, DASH diet, exercise and monitor at home. Call if greater than 130/80.  - CBC with Differential/Platelet - BASIC METABOLIC PANEL WITH GFR - TSH - Hepatic function panel  Morbid Obesity Obesity with co morbidities- long discussion about weight loss, diet, and exercise   History of nephrolithiasis Increase fluids   Diverticulitis of intestine without perforation or abscess without bleeding Continue fiber  Prediabetes Discussed general issues about diabetes pathophysiology and management., Educational material distributed., Suggested low cholesterol diet., Encouraged aerobic exercise., Discussed foot care., Reminded to get yearly retinal exam. - Insulin, fasting   Medication management - Magnesium  Vitamin D deficiency - Vit D  25 hydroxy (rtn osteoporosis monitoring)  Screening for prostate cancer - PSA  . Discussed med's effects and SE's. Screening labs and tests as requested with regular follow-up as recommended. Future Appointments  Date Time Provider [place]  01/19/2019 10:00 AM Vicie Mutters, PA-C [facility] None  01/25/2020 10:00 AM Vicie Mutters, PA-C [facility] None    HPI Patient presents for a complete physical.    His blood pressure has been controlled at home, today their BP is 122/82 BP: 126/82 He does workout, on tread climber at gym. He denies chest pain, shortness of breath, dizziness.  He is on cholesterol  medication, crestor 5mg  and denies myalgias. His cholesterol is at goal. The cholesterol last visit was:   Lab Results  Component Value Date   CHOL 165 12/16/2018   HDL 50 12/16/2018   LDLCALC 103 (H) 12/16/2018   TRIG 60 12/16/2018   CHOLHDL 3.3 12/16/2018   Last A1C in the office was:  Lab Results  Component Value Date   HGBA1C 5.1 09/14/2018   Patient is on Vitamin D supplement- has been on treatment.   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  Mom passed 2017 BMI is Body mass index is 35.97 kg/m., he is working on diet and exercise, now that he is retired he is increasing exercise- joined gym at [employer], increasing water, and going to decrease portions.  He has + sleep apnea, suppose to be on biPAP optimal pressure 17/13.  Wt Readings from Last 3 Encounters:  01/19/19 272 lb 9.6 oz (123.7 kg)  12/21/18 278 lb (126.1 kg)  09/15/18 256 lb (116.1 kg)   He has asthma/allergies and is on allegra, and has albuterol which helps.   Current Medications:  Current Outpatient Medications on File Prior to Visit  Medication Sig Dispense Refill  . albuterol (PROVENTIL HFA;VENTOLIN HFA) 108 (90 Base) MCG/ACT inhaler Inhale 1 puff into the lungs every 6 (six) hours as needed for wheezing or shortness of breath.    . Cholecalciferol (VITAMIN D3 PO) Take 6,000 Units by mouth daily.     . citalopram (CELEXA) 40 MG tablet  Take 20 mg by mouth daily.    . fluticasone (FLONASE) 50 MCG/ACT nasal spray Place 2 sprays into both nostrils as needed for allergies. 16 g 3  . Magnesium 400 MG CAPS Take 1 tablet by mouth daily.    . metoprolol tartrate (LOPRESSOR) 25 MG tablet Take 0.5 tablets (12.5 mg total) by mouth 2 (two) times daily. 30 tablet 5  . OVER THE COUNTER  MEDICATION Takes One a Day Men's Health formula MVI 1 daily.    . verapamil (CALAN-SR) 120 MG CR tablet TAKE 1 TABLET DAILY WITH FOOD FOR PALPITATIONS. 90 tablet 0  . rosuvastatin (CRESTOR) 5 MG tablet Take 1 tablet (5 mg total) by mouth daily. 90 tablet 3   No current facility-administered medications on file prior to visit.    Health Maintenance:  Immunization History  Administered Date(s) Administered  . DTaP 08/25/2011  . Influenza Inj Mdck Quad With Preservative 08/19/2017, 09/14/2018  . Influenza Whole 08/29/2013   Tetanus: 2012 Pneumovax: N/A Prevnar 13: N/A Flu vaccine:2019 Zostavax: N/A DEXA: N/A Colonoscopy: 08/2016 due 10 years EGD: N/A CXR 2016  Patient Care Team: [name], MD as PCP - General (Internal Medicine) Nahser, Wonda Cheng, MD as PCP - Cardiology (Cardiology) Druscilla Brownie, MD as Consulting Physician (Dermatology) Richmond Campbell, MD as Consulting Physician (Gastroenterology) Monna Fam, MD as Consulting Physician (Ophthalmology) Nahser, Wonda Cheng, MD as Consulting Physician (Cardiology)   Medical History:  Past Medical History:  Diagnosis Date  . Allergy   . Asthma   . Diverticulitis   . Diverticulitis 09/17/2015  . Essential hypertension   . Hemorrhoid   . Hyperlipidemia   . Kidney stones   . Mild dilation of ascending aorta (HCC)   . Obesity   . Obstructive sleep apnea 10/16/2010   Qualifier: Diagnosis of  By: Gwenette Greet MD, Armando Reichert  Not on CPAP    . Seasonal allergies 08/24/2013  . Swollen lymph nodes    abd, groin,    Allergies Allergies  Allergen Reactions  . Shellfish Allergy Nausea Only  . Iodine Other (See Comments)    Unknown Unknown Allergic to shellfish - nausea    SURGICAL HISTORY He  has a past surgical history that includes Vasectomy (MID 90S). FAMILY HISTORY His family history includes Atrial fibrillation in his mother; Cancer in his brother; Diabetes in his father; Heart disease in his father; Heart failure  in his father; Stroke in his mother. SOCIAL HISTORY He  reports that he has never smoked. He has quit using smokeless tobacco.  His smokeless tobacco use included chew. He reports current alcohol use of about 1.0 - 2.0 standard drinks of alcohol per week. He reports that he does not use drugs.  Review of Systems  Constitutional: Negative.   HENT: Negative.   Eyes: Negative.   Respiratory: Negative.   Cardiovascular: Negative.   Gastrointestinal: Negative.   Genitourinary: Negative.   Musculoskeletal: Negative.   Skin: Negative.   Neurological: Negative.   Psychiatric/Behavioral: Negative.     Physical Exam: Estimated body mass index is 35.97 kg/m as calculated from the following:   Height as of this encounter: 6\' 1"  (1.854 m).   Weight as of this encounter: 272 lb 9.6 oz (123.7 kg). BP 126/82   Pulse 70   Temp 97.6 F (36.4 C)   Ht 6\' 1"  (1.854 m)   Wt 272 lb 9.6 oz (123.7 kg)   SpO2 97%   BMI 35.97 kg/m  General Appearance: Well nourished, in no apparent distress.  Eyes: PERRLA, EOMs, conjunctiva no swelling or erythema, normal fundi and vessels.  Sinuses: No Frontal/maxillary tenderness  ENT/Mouth: Ext aud canals clear, normal light reflex with TMs without erythema, bulging. Good dentition. No erythema, swelling, or exudate on post pharynx. Tonsils not swollen or erythematous. Hearing normal.  Neck: Supple, thyroid normal. No bruits  Respiratory: Respiratory effort normal, BS equal bilaterally without rales, rhonchi, wheezing or stridor.  Cardio: RRR without murmurs, rubs or gallops. Brisk peripheral pulses without edema.  Chest: symmetric, with normal excursions and percussion.  Abdomen: Soft, nontender, obese,  no guarding, rebound, hernias, masses, or organomegaly.  Lymphatics: Non tender without lymphadenopathy.  Genitourinary: defer Musculoskeletal: Full ROM all peripheral extremities,5/5 strength, and normal gait.  Skin: Warm, dry without rashes, lesions,  ecchymosis. Neuro: Cranial nerves intact, reflexes equal bilaterally. Normal muscle tone, no cerebellar symptoms. Sensation intact.  Psych: Awake and oriented X 3, normal affect, Insight and Judgment appropriate.   EKG: IRBBB, no ST changes.   Vicie Mutters 9:50 AM [facility]

## 2019-01-19 ENCOUNTER — Ambulatory Visit (INDEPENDENT_AMBULATORY_CARE_PROVIDER_SITE_OTHER): Payer: 59 | Admitting: Physician Assistant

## 2019-01-19 ENCOUNTER — Encounter: Payer: Self-pay | Admitting: Physician Assistant

## 2019-01-19 ENCOUNTER — Other Ambulatory Visit: Payer: Self-pay

## 2019-01-19 VITALS — BP 126/82 | HR 70 | Temp 97.6°F | Ht 73.0 in | Wt 272.6 lb

## 2019-01-19 DIAGNOSIS — J45909 Unspecified asthma, uncomplicated: Secondary | ICD-10-CM

## 2019-01-19 DIAGNOSIS — Z0000 Encounter for general adult medical examination without abnormal findings: Secondary | ICD-10-CM | POA: Diagnosis not present

## 2019-01-19 DIAGNOSIS — I493 Ventricular premature depolarization: Secondary | ICD-10-CM

## 2019-01-19 DIAGNOSIS — J302 Other seasonal allergic rhinitis: Secondary | ICD-10-CM

## 2019-01-19 DIAGNOSIS — I10 Essential (primary) hypertension: Secondary | ICD-10-CM | POA: Diagnosis not present

## 2019-01-19 DIAGNOSIS — R002 Palpitations: Secondary | ICD-10-CM

## 2019-01-19 DIAGNOSIS — Z136 Encounter for screening for cardiovascular disorders: Secondary | ICD-10-CM | POA: Diagnosis not present

## 2019-01-19 DIAGNOSIS — Z87442 Personal history of urinary calculi: Secondary | ICD-10-CM

## 2019-01-19 DIAGNOSIS — Z79899 Other long term (current) drug therapy: Secondary | ICD-10-CM | POA: Diagnosis not present

## 2019-01-19 DIAGNOSIS — E559 Vitamin D deficiency, unspecified: Secondary | ICD-10-CM

## 2019-01-19 DIAGNOSIS — Z23 Encounter for immunization: Secondary | ICD-10-CM

## 2019-01-19 DIAGNOSIS — F325 Major depressive disorder, single episode, in full remission: Secondary | ICD-10-CM

## 2019-01-19 DIAGNOSIS — Z125 Encounter for screening for malignant neoplasm of prostate: Secondary | ICD-10-CM

## 2019-01-19 DIAGNOSIS — E785 Hyperlipidemia, unspecified: Secondary | ICD-10-CM

## 2019-01-19 DIAGNOSIS — Z0001 Encounter for general adult medical examination with abnormal findings: Secondary | ICD-10-CM

## 2019-01-19 DIAGNOSIS — G4733 Obstructive sleep apnea (adult) (pediatric): Secondary | ICD-10-CM

## 2019-01-19 MED ORDER — VERAPAMIL HCL ER 120 MG PO TBCR: EXTENDED_RELEASE_TABLET | ORAL | 1 refills | Status: DC

## 2019-01-19 MED ORDER — METOPROLOL TARTRATE 25 MG PO TABS: 12.5000 mg | ORAL_TABLET | Freq: Two times a day (BID) | ORAL | 1 refills | Status: DC

## 2019-01-19 NOTE — Patient Instructions (Signed)
B12 is low end of normal, add sublingual B12. The sublingual matters more than the dose, get any dose but make sure it melts in your mouth.   Will help with energy, memory/concentration, decrease nerve pain, and help with weight loss. B12 is water soluble vitamin so you can not over dose on it,.  Can just get B complex     When it comes to diets, agreement about the perfect plan isn't easy to find, even among the experts. Experts at the [employer] developed an idea known as the Healthy Eating Plate. Just imagine a plate divided into logical, healthy portions.  The emphasis is on diet quality:  Load up on vegetables and fruits - one-half of your plate: Aim for color and variety, and remember that potatoes don't count.  Go for whole grains - one-quarter of your plate: Whole wheat, barley, wheat berries, quinoa, oats, brown rice, and foods made with them. If you want pasta, go with whole wheat pasta.  Protein power - one-quarter of your plate: Fish, chicken, beans, and nuts are all healthy, versatile protein sources. Limit red meat.  The diet, however, does go beyond the plate, offering a few other suggestions.  Use healthy plant oils, such as olive, canola, soy, corn, sunflower and peanut. Check the labels, and avoid partially hydrogenated oil, which have unhealthy trans fats.  If you're thirsty, drink water. Coffee and tea are good in moderation, but skip sugary drinks and limit milk and dairy products to one or two daily servings.  The type of carbohydrate in the diet is more important than the amount. Some sources of carbohydrates, such as vegetables, fruits, whole grains, and beans-are healthier than others.  Finally, stay active.  GENERAL HEALTH GOALS  Know what a healthy weight is for you (roughly BMI <25) and aim to maintain this  Aim for 7+ servings of fruits and vegetables daily  70-80+ fluid ounces of water or unsweet tea for healthy kidneys  Limit to  max 1 drink of alcohol per day; avoid smoking/tobacco  Limit animal fats in diet for cholesterol and heart health - choose grass fed whenever available  Avoid highly processed foods, and foods high in saturated/trans fats  Aim for low stress - take time to unwind and care for your mental health  Aim for 150 min of moderate intensity exercise weekly for heart health, and weights twice weekly for bone health  Aim for 7-9 hours of sleep daily

## 2019-01-20 LAB — URINALYSIS, ROUTINE W REFLEX MICROSCOPIC
Bilirubin Urine: NEGATIVE
Glucose, UA: NEGATIVE
Hgb urine dipstick: NEGATIVE
KETONES UR: NEGATIVE
Leukocytes,Ua: NEGATIVE
Nitrite: NEGATIVE
Protein, ur: NEGATIVE
Specific Gravity, Urine: 1.016 (ref 1.001–1.03)
pH: <=5 (ref 5.0–8.0)

## 2019-01-20 LAB — CBC WITH DIFFERENTIAL/PLATELET
Absolute Monocytes: 616 cells/uL (ref 200–950)
BASOS PCT: 0.5 %
Basophils Absolute: 28 cells/uL (ref 0–200)
Eosinophils Absolute: 341 cells/uL (ref 15–500)
Eosinophils Relative: 6.2 %
HCT: 45.9 % (ref 38.5–50.0)
Hemoglobin: 15.5 g/dL (ref 13.2–17.1)
Lymphs Abs: 1144 cells/uL (ref 850–3900)
MCH: 30.6 pg (ref 27.0–33.0)
MCHC: 33.8 g/dL (ref 32.0–36.0)
MCV: 90.7 fL (ref 80.0–100.0)
MPV: 9.7 fL (ref 7.5–12.5)
Monocytes Relative: 11.2 %
NEUTROS PCT: 61.3 %
Neutro Abs: 3372 cells/uL (ref 1500–7800)
PLATELETS: 240 10*3/uL (ref 140–400)
RBC: 5.06 10*6/uL (ref 4.20–5.80)
RDW: 13.1 % (ref 11.0–15.0)
TOTAL LYMPHOCYTE: 20.8 %
WBC: 5.5 10*3/uL (ref 3.8–10.8)

## 2019-01-20 LAB — MAGNESIUM: Magnesium: 2.1 mg/dL (ref 1.5–2.5)

## 2019-01-20 LAB — COMPLETE METABOLIC PANEL WITH GFR
AG Ratio: 2 (calc) (ref 1.0–2.5)
ALKALINE PHOSPHATASE (APISO): 54 U/L (ref 35–144)
ALT: 27 U/L (ref 9–46)
AST: 23 U/L (ref 10–35)
Albumin: 4.4 g/dL (ref 3.6–5.1)
BUN: 18 mg/dL (ref 7–25)
CO2: 28 mmol/L (ref 20–32)
Calcium: 9.7 mg/dL (ref 8.6–10.3)
Chloride: 104 mmol/L (ref 98–110)
Creat: 1.06 mg/dL (ref 0.70–1.33)
GFR, Est African American: 89 mL/min/{1.73_m2} (ref >=60)
GFR, Est Non African American: 77 mL/min/{1.73_m2} (ref >=60)
GLUCOSE: 97 mg/dL (ref 65–99)
Globulin: 2.2 g/dL (calc) (ref 1.9–3.7)
Potassium: 4.4 mmol/L (ref 3.5–5.3)
SODIUM: 140 mmol/L (ref 135–146)
Total Bilirubin: 1.1 mg/dL (ref 0.2–1.2)
Total Protein: 6.6 g/dL (ref 6.1–8.1)

## 2019-01-20 LAB — LIPID PANEL
CHOLESTEROL: 184 mg/dL (ref <200)
HDL: 54 mg/dL (ref >=40)
LDL Cholesterol (Calc): 112 mg/dL (calc) — ABNORMAL HIGH
Non-HDL Cholesterol (Calc): 130 mg/dL (calc) — ABNORMAL HIGH (ref <130)
Total CHOL/HDL Ratio: 3.4 (calc) (ref <5.0)
Triglycerides: 82 mg/dL (ref <150)

## 2019-01-20 LAB — MICROALBUMIN / CREATININE URINE RATIO
Creatinine, Urine: 95 mg/dL (ref 20–320)
Microalb Creat Ratio: 2 mcg/mg creat (ref <30)
Microalb, Ur: 0.2 mg/dL

## 2019-01-20 LAB — TSH: TSH: 2.03 mIU/L (ref 0.40–4.50)

## 2019-01-20 LAB — PSA: PSA: 1 ng/mL (ref <=4.0)

## 2019-01-20 LAB — VITAMIN D 25 HYDROXY (VIT D DEFICIENCY, FRACTURES): Vit D, 25-Hydroxy: 69 ng/mL (ref 30–100)

## 2019-04-22 ENCOUNTER — Ambulatory Visit (HOSPITAL_COMMUNITY)
Admission: EM | Admit: 2019-04-22 | Discharge: 2019-04-22 | Disposition: A | Discharge status: Home / Self Care | Payer: 59 | Attending: Family Medicine | Admitting: Family Medicine

## 2019-04-22 ENCOUNTER — Encounter (HOSPITAL_COMMUNITY): Payer: Self-pay | Admitting: Emergency Medicine

## 2019-04-22 ENCOUNTER — Other Ambulatory Visit: Payer: Self-pay

## 2019-04-22 DIAGNOSIS — R1032 Left lower quadrant pain: Secondary | ICD-10-CM

## 2019-04-22 MED ORDER — CIPROFLOXACIN HCL 500 MG PO TABS: 500.0000 mg | ORAL_TABLET | Freq: Two times a day (BID) | ORAL | 0 refills | Status: AC

## 2019-04-22 MED ORDER — METRONIDAZOLE 500 MG PO TABS: 500.0000 mg | ORAL_TABLET | Freq: Three times a day (TID) | ORAL | 0 refills | Status: AC

## 2019-04-22 NOTE — ED Provider Notes (Signed)
[place]    CSN: [id number] Arrival date & time: 04/22/19  0944      History   Chief Complaint Chief Complaint  Patient presents with  . Abdominal Pain    HPI David Fuller is a 58 y.o. male.   David Fuller presents with complaints of  LLQ abdominal pain, sharp, which started this morning at 0600. He states he noticed it when he first woke up. Pain with certain movements, improves while at rest. No pain during history. No nausea or vomiting. Has been able to eat today. States he has regular bowel movements, no change, no blood in stools. No fevers. No urinary symptoms. Last colonoscopy 1-2 years ago, he states he had a few polyps and diverticulosis. Hx of diverticulitis, per chart review last in 2015. States feels similar to diverticulitis in the past, which has always responded well to antibiotics. He did work in his yard yesterday removing bushes, with some twisting/ abnormal physical movements. No heavy lifting with this. No pain last evening. Hx of asthma, htn, hyperlipidemia.     ROS per HPI, negative if not otherwise mentioned.      Past Medical History:  Diagnosis Date  . Allergy   . Asthma   . Diverticulitis   . Diverticulitis 09/17/2015  . Essential hypertension   . Hemorrhoid   . Hyperlipidemia   . Kidney stones   . Mild dilation of ascending aorta (HCC)   . Obesity   . Obstructive sleep apnea 10/16/2010   Qualifier: Diagnosis of  By: Gwenette Greet MD, Armando Reichert  Not on CPAP    . Seasonal allergies 08/24/2013  . Swollen lymph nodes    abd, groin,     Patient Active Problem List   Diagnosis Date Noted  . Depression, major, in remission ([place]) 09/16/2018  . Need for immunization against influenza 09/14/2018  . PVC's (premature ventricular contractions) 09/15/2017  . Essential hypertension 09/17/2015  . Morbid obesity ([place]) 09/17/2015  . History of nephrolithiasis 09/17/2015  . Hyperlipidemia 08/24/2013  . Seasonal allergies 08/24/2013   . Asthma 08/24/2013  . Obstructive sleep apnea 10/16/2010    Past Surgical History:  Procedure Laterality Date  . VASECTOMY  MID 90S       Home Medications    Prior to Admission medications   Medication Sig Start Date End Date Taking? Authorizing Provider  albuterol (PROVENTIL HFA;VENTOLIN HFA) 108 (90 Base) MCG/ACT inhaler Inhale 1 puff into the lungs every 6 (six) hours as needed for wheezing or shortness of breath.    [provider]  Cholecalciferol (VITAMIN D3 PO) Take 6,000 Units by mouth daily.     [provider]  ciprofloxacin (CIPRO) 500 MG tablet Take 1 tablet (500 mg total) by mouth every 12 (twelve) hours for 7 days. 04/22/19 04/29/19  Zigmund Gottron, NP  fluticasone (FLONASE) 50 MCG/ACT nasal spray Place 2 sprays into both nostrils as needed for allergies. 06/17/16   Rolene Course, PA-C  Magnesium 400 MG CAPS Take 1 tablet by mouth daily.    [provider]  metoprolol tartrate (LOPRESSOR) 25 MG tablet Take 0.5 tablets (12.5 mg total) by mouth 2 (two) times daily. 01/19/19   Vicie Mutters, PA-C  metroNIDAZOLE (FLAGYL) 500 MG tablet Take 1 tablet (500 mg total) by mouth 3 (three) times daily for 7 days. 04/22/19 04/29/19  Zigmund Gottron, NP  OVER THE COUNTER MEDICATION Takes One a Day Men's Health formula MVI 1 daily.    [provider]  rosuvastatin (CRESTOR) 5 MG tablet Take 1 tablet (5 mg total) by mouth daily. 09/15/18 12/21/18  Nahser, Wonda Cheng, MD  verapamil (CALAN-SR) 120 MG CR tablet Take 1 pill daily 01/19/19   Vicie Mutters, PA-C    Family History Family History  Problem Relation Age of Onset  . Diabetes Father   . Heart disease Father   . Heart failure Father   . Cancer Brother        Melanoma- left arm  . Atrial fibrillation Mother   . Stroke Mother     Social History Social History   Tobacco Use  . Smoking status: Never Smoker  . Smokeless tobacco: Former Systems developer    Types: Chew  Substance Use Topics  . Alcohol  use: Yes    Alcohol/week: 1.0 - 2.0 standard drinks    Types: 1 - 2 Cans of beer per week    Comment: 2OZ A WEEK  . Drug use: No     Allergies   Shellfish allergy and Iodine   Review of Systems Review of Systems   Physical Exam Triage Vital Signs ED Triage Vitals  Enc Vitals Group     BP 04/22/19 1014 135/85     Pulse Rate 04/22/19 1014 81     Resp 04/22/19 1014 18     Temp 04/22/19 1014 98.6 F (37 C)     Temp Source 04/22/19 1014 Oral     SpO2 04/22/19 1014 97 %     Weight --      Height --      Head Circumference --      Peak Flow --      Pain Score 04/22/19 1015 4     Pain Loc --      Pain Edu? --      Excl. in [place]? --    No data found.  Updated Vital Signs BP 135/85 (BP Location: Right Arm)   Pulse 81   Temp 98.6 F (37 C) (Oral)   Resp 18   SpO2 97%    Physical Exam Constitutional:      Appearance: He is well-developed. He is obese.  Cardiovascular:     Rate and Rhythm: Normal rate and regular rhythm.  Pulmonary:     Effort: Pulmonary effort is normal.     Breath sounds: Normal breath sounds.  Abdominal:     General: Bowel sounds are normal.     Palpations: Abdomen is soft.     Tenderness: There is abdominal tenderness in the left lower quadrant. There is no right CVA tenderness, left CVA tenderness, guarding or rebound.     Hernia: No hernia is present.     Comments: No palpable hernia; pain with palpation to LLQ; no pain with ROM during exam  Skin:    General: Skin is warm and dry.  Neurological:     Mental Status: He is alert and oriented to person, place, and time.      UC Treatments / Results  Labs (all labs ordered are listed, but only abnormal results are displayed) Labs Reviewed - No data to display  EKG None  Radiology No results found.  Procedures Procedures (including critical care time)  Medications Ordered in UC Medications - No data to display  Initial Impression / Assessment and Plan / UC Course  I have  reviewed the triage vital signs and the nursing notes.  Pertinent labs & imaging results that were available during my care of the patient were reviewed by  me and considered in my medical decision making (see chart for details).     No distress. No diarrhea, vomiting or nausea. Afebrile. New onset this morning. Hx of diverticulosis and diverticulitis. Antibiotics initiated with strict return precautions. Muscle strain vs hernia additionally considered and discussed. Patient verbalized understanding and agreeable to plan.  Ambulatory out of clinic without difficulty.    Final Clinical Impressions(s) / UC Diagnoses   Final diagnoses:  Abdominal pain, left lower quadrant     Discharge Instructions     We will start antibiotics for concern for diverticulitis.  Please go to ER for further evaluation if develop increased pain, fever, nausea, vomiting, diarrhea.  May be muscular in nature as well, rest as able, ice application, tylenol as needed.     ED Prescriptions    Medication Sig Dispense Auth. Provider   metroNIDAZOLE (FLAGYL) 500 MG tablet Take 1 tablet (500 mg total) by mouth 3 (three) times daily for 7 days. 21 tablet Augusto Gamble B, NP   ciprofloxacin (CIPRO) 500 MG tablet Take 1 tablet (500 mg total) by mouth every 12 (twelve) hours for 7 days. 14 tablet Zigmund Gottron, NP     Controlled Substance Prescriptions [place] Controlled Substance Registry consulted? Not Applicable   Zigmund Gottron, NP 04/22/19 1127

## 2019-04-22 NOTE — ED Triage Notes (Signed)
Pt here for LLQ pain starting this am; pt sts hx of diverticulitis and sts feels similar

## 2019-04-22 NOTE — Discharge Instructions (Signed)
We will start antibiotics for concern for diverticulitis.  Please go to ER for further evaluation if develop increased pain, fever, nausea, vomiting, diarrhea.  May be muscular in nature as well, rest as able, ice application, tylenol as needed.

## 2019-05-10 ENCOUNTER — Other Ambulatory Visit: Payer: Self-pay | Admitting: Cardiovascular Disease

## 2019-07-15 IMAGING — DX DG CHEST 2V
2 series · 2 of 2 positions shown · non-contrast
Comparison: 07/19/2017

CLINICAL DATA: Tachycardia and lightheadedness tonight,
postprandial.

EXAM:
CHEST  2 VIEW

[w chest lat]
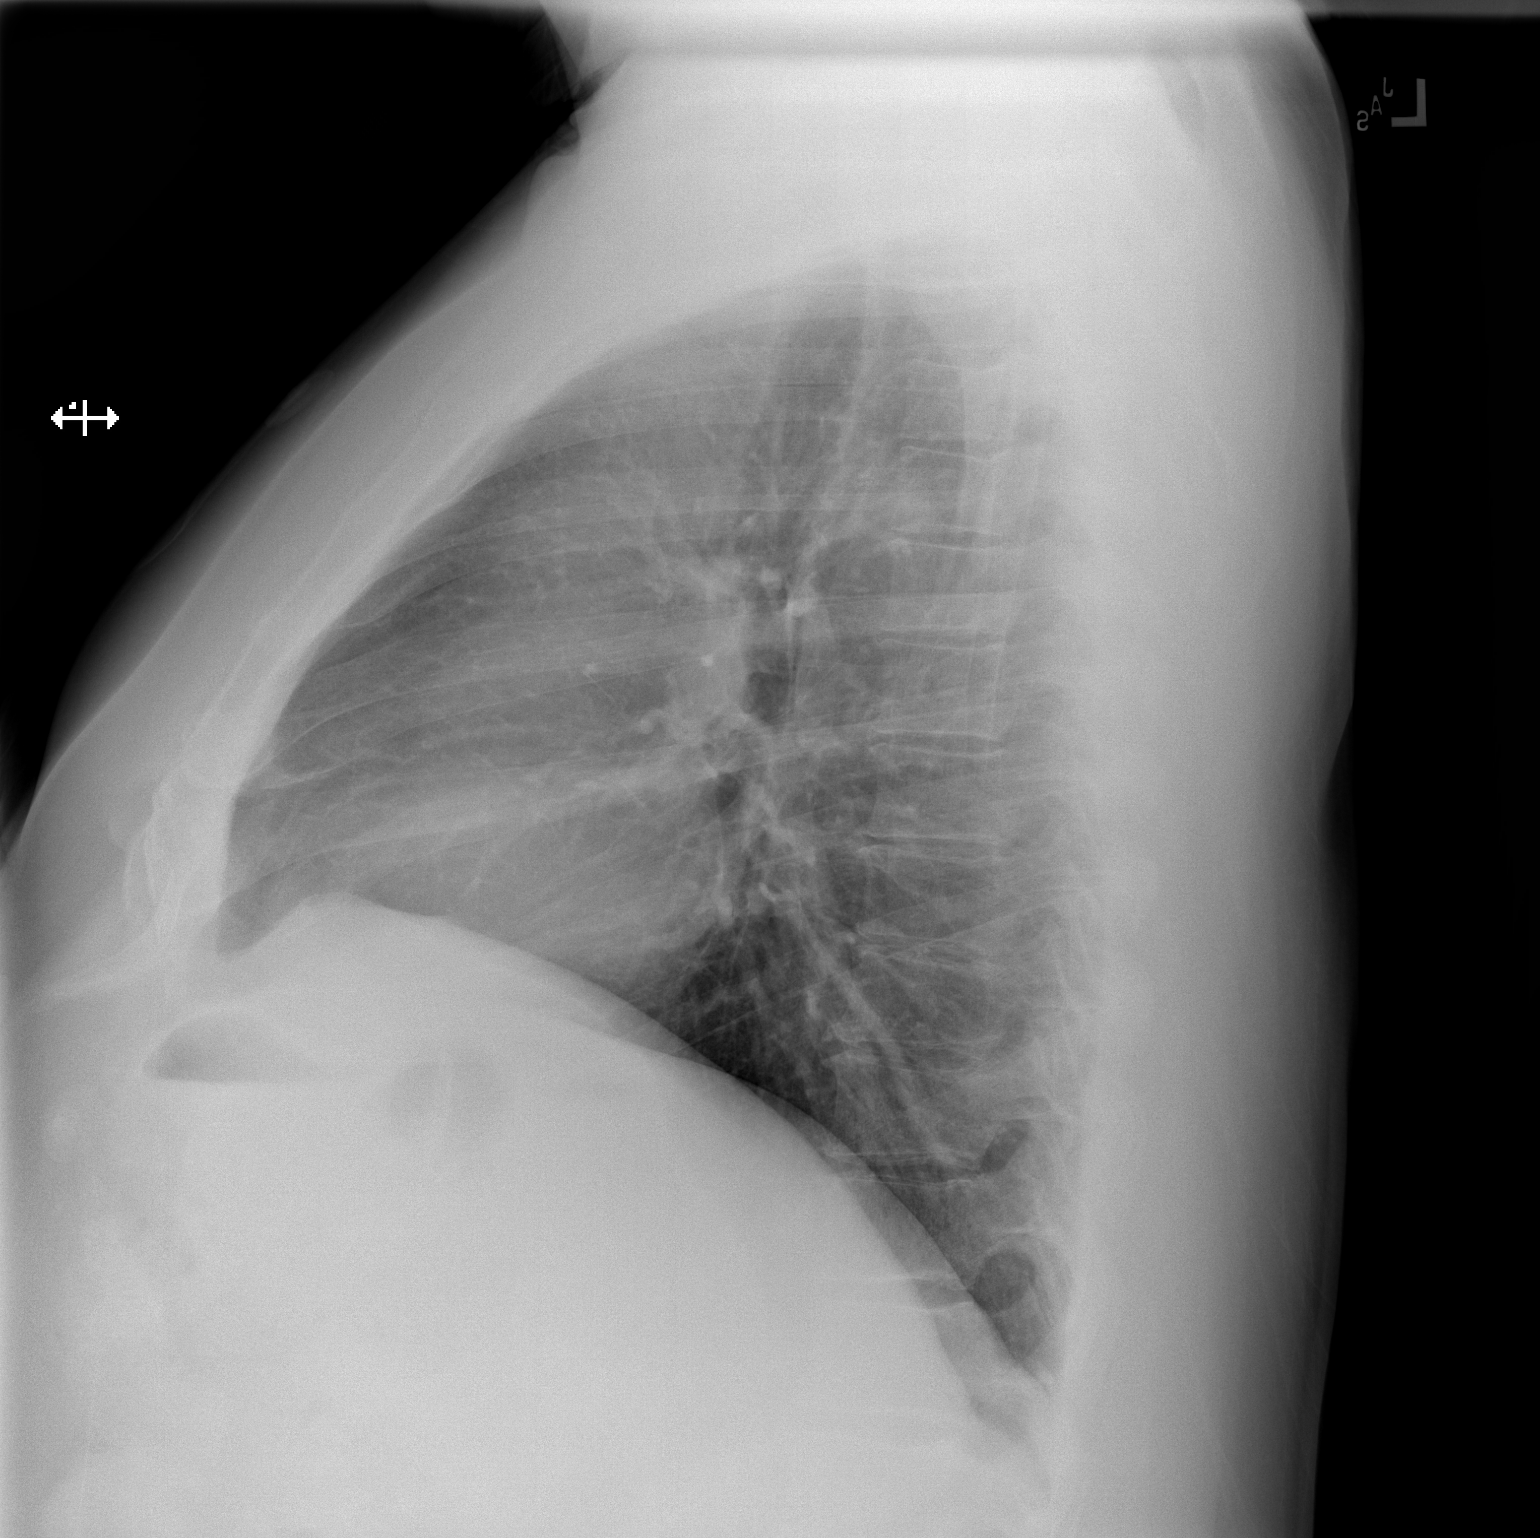

[w chest pa]
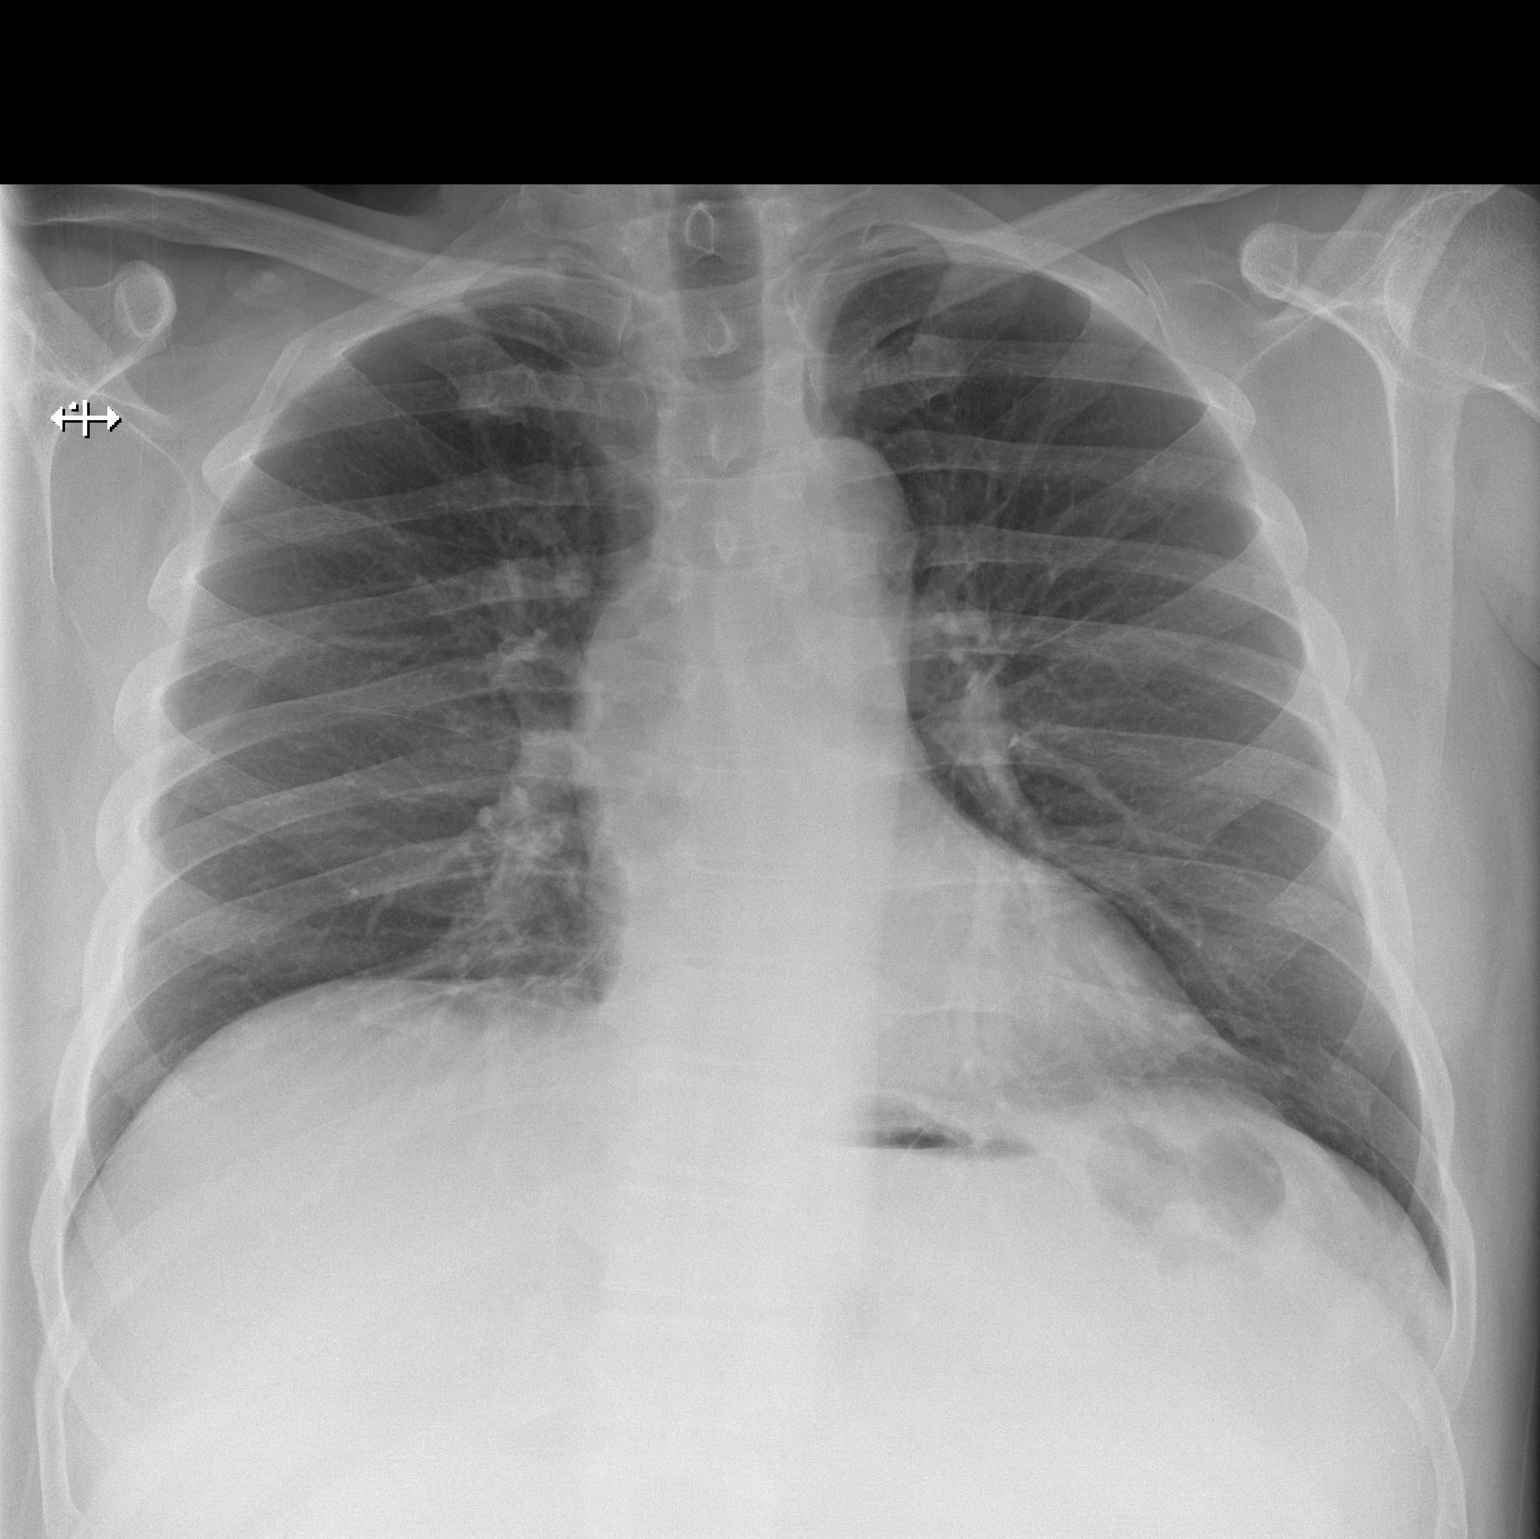

[2 of 2 positions shown; findings below may reference images not displayed]

FINDINGS: The lungs are clear. The pulmonary vasculature is normal. Heart size
is normal. Hilar and mediastinal contours are unremarkable. There is
no pleural effusion.
IMPRESSION: No active cardiopulmonary disease.

## 2019-07-20 NOTE — Progress Notes (Signed)
Assessment and Plan:  Hyperlipidemia -continue medications, check lipids, decrease fatty foods, increase activity.  - Lipid panel   Essential hypertension - continue medications, DASH diet, exercise and monitor at home. Call if greater than 130/80.  - CBC with Differential/Platelet - BASIC METABOLIC PANEL WITH GFR - TSH - Hepatic function panel   morbid Obesity Obesity with co morbidities- long discussion about weight loss, diet, and exercise   Medication management - Magnesium  Vitamin D deficiency - Vit D  25 hydroxy (rtn osteoporosis monitoring)   . Discussed med's effects and SE's. Screening labs and tests as requested with regular follow-up as recommended. Future Appointments  Date Time Provider [place]  01/25/2020 10:00 AM Vicie Mutters, PA-C [facility] None      HPI 58 y.o. WM with history of HTN, chol, vitamin D def presents for follow up.  BMI is Body mass index is 35.89 kg/m., he is working on diet and exercise. He is walking 4-5 miles a day.  Wt Readings from Last 3 Encounters:  07/25/19 272 lb (123.4 kg)  01/19/19 272 lb 9.6 oz (123.7 kg)  12/21/18 278 lb (126.1 kg)      His blood pressure has been controlled at home, today their BP is BP: 130/78 He does workout, on tread climber/walking. He denies chest pain, shortness of breath, dizziness.  He has had palpitations, following with Dr. Sondra Come, had echo and holter monitor, only PVCs.  He is not on cholesterol medication.[name] Kitchen His cholesterol is at goal. The cholesterol last visit was:   Lab Results  Component Value Date   CHOL 184 01/19/2019   HDL 54 01/19/2019   LDLCALC 112 (H) 01/19/2019   TRIG 82 01/19/2019   CHOLHDL 3.4 01/19/2019   Last A1C in the office was:  Lab Results  Component Value Date   HGBA1C 5.1 09/14/2018   Patient is on Vitamin D supplement, 6000 IU daily.    Lab Results  Component Value Date   VD25OH 69 01/19/2019     He has asthma/allergies and is on singulair ,  allegra, and has albuterol which helps.   Current Medications:  Current Outpatient Medications on File Prior to Visit  Medication Sig Dispense Refill  . albuterol (PROVENTIL HFA;VENTOLIN HFA) 108 (90 Base) MCG/ACT inhaler Inhale 1 puff into the lungs every 6 (six) hours as needed for wheezing or shortness of breath.    . Cholecalciferol (VITAMIN D3 PO) Take 6,000 Units by mouth daily.     . fluticasone (FLONASE) 50 MCG/ACT nasal spray Place 2 sprays into both nostrils as needed for allergies. 16 g 3  . Magnesium 400 MG CAPS Take 1 tablet by mouth daily.    . metoprolol tartrate (LOPRESSOR) 25 MG tablet Take 0.5 tablets (12.5 mg total) by mouth 2 (two) times daily. 90 tablet 1  . OVER THE COUNTER MEDICATION Takes One a Day Men's Health formula MVI 1 daily.    . rosuvastatin (CRESTOR) 5 MG tablet Take 1 tablet (5 mg total) by mouth daily. 30 tablet 6  . verapamil (CALAN-SR) 120 MG CR tablet Take 1 pill daily 90 tablet 1   No current facility-administered medications on file prior to visit.    Review of Systems  Constitutional: Negative.   HENT: Negative.   Respiratory: Negative.   Cardiovascular: Negative.   Gastrointestinal: Negative.   Genitourinary: Negative.   Musculoskeletal: Negative.   Skin: Negative.   Neurological: Negative.   Psychiatric/Behavioral: Negative.     Physical Exam: Estimated body mass index  is 35.89 kg/m as calculated from the following:   Height as of this encounter: 6\' 1"  (1.854 m).   Weight as of this encounter: 272 lb (123.4 kg). BP 130/78   Pulse 67   Temp 97.9 F (36.6 C)   Ht 6\' 1"  (1.854 m)   Wt 272 lb (123.4 kg)   SpO2 96%   BMI 35.89 kg/m  General Appearance: Well nourished, in no apparent distress.  Eyes: PERRLA, EOMs, conjunctiva no swelling or erythema, normal fundi and vessels.  Sinuses: No Frontal/maxillary tenderness  ENT/Mouth: Ext aud canals clear, normal light reflex with TMs without erythema, bulging. Good dentition. No erythema,  swelling, or exudate on post pharynx. Tonsils not swollen or erythematous. Hearing normal.  Neck: Supple, thyroid normal. No bruits  Respiratory: Respiratory effort normal, BS equal bilaterally without rales, rhonchi, wheezing or stridor.  Cardio: RRR without murmurs, rubs or gallops. Brisk peripheral pulses without edema.  Chest: symmetric, with normal excursions and percussion.  Abdomen: Soft, nontender, obese,  no guarding, rebound, hernias, masses, or organomegaly. .  Lymphatics: Non tender without lymphadenopathy.  Musculoskeletal: Full ROM all peripheral extremities,5/5 strength, and normal gait.  Skin: Warm, dry without rashes, lesions, ecchymosis. Neuro: Cranial nerves intact, reflexes equal bilaterally. Normal muscle tone, no cerebellar symptoms. Sensation intact.  Psych: Awake and oriented X 3, normal affect, Insight and Judgment appropriate.    Vicie Mutters 8:52 AM [facility]

## 2019-07-25 ENCOUNTER — Encounter: Payer: Self-pay | Admitting: Physician Assistant

## 2019-07-25 ENCOUNTER — Other Ambulatory Visit: Payer: Self-pay

## 2019-07-25 ENCOUNTER — Ambulatory Visit (INDEPENDENT_AMBULATORY_CARE_PROVIDER_SITE_OTHER): Payer: 59 | Admitting: Physician Assistant

## 2019-07-25 VITALS — BP 130/78 | HR 67 | Temp 97.9°F | Ht 73.0 in | Wt 272.0 lb

## 2019-07-25 DIAGNOSIS — E538 Deficiency of other specified B group vitamins: Secondary | ICD-10-CM

## 2019-07-25 DIAGNOSIS — E785 Hyperlipidemia, unspecified: Secondary | ICD-10-CM

## 2019-07-25 DIAGNOSIS — I493 Ventricular premature depolarization: Secondary | ICD-10-CM

## 2019-07-25 DIAGNOSIS — I10 Essential (primary) hypertension: Secondary | ICD-10-CM

## 2019-07-25 DIAGNOSIS — E559 Vitamin D deficiency, unspecified: Secondary | ICD-10-CM

## 2019-07-25 DIAGNOSIS — F325 Major depressive disorder, single episode, in full remission: Secondary | ICD-10-CM | POA: Diagnosis not present

## 2019-07-25 DIAGNOSIS — R002 Palpitations: Secondary | ICD-10-CM

## 2019-07-25 MED ORDER — ROSUVASTATIN CALCIUM 5 MG PO TABS: 5.0000 mg | ORAL_TABLET | Freq: Every day | ORAL | 1 refills | Status: DC

## 2019-07-25 MED ORDER — METOPROLOL TARTRATE 25 MG PO TABS: 12.5000 mg | ORAL_TABLET | Freq: Two times a day (BID) | ORAL | 1 refills | Status: DC

## 2019-07-25 MED ORDER — VERAPAMIL HCL ER 120 MG PO TBCR: EXTENDED_RELEASE_TABLET | ORAL | 1 refills | Status: DC

## 2019-07-25 NOTE — Patient Instructions (Signed)
VENOUS INSUFFICIENCY Our lower leg venous system is not the most reliable, the heart does NOT pump fluid up, there is a valve system.  The muscles of the leg squeeze and the blood moves up and a valve opens and close, then they squeeze, blood moves up and valves open and closes keeping the blood moving towards the heart.  Lots can go wrong with this valve system.  If someone is sitting or standing without movement, everyone will get swelling.  THINGS TO DO:  Do not stand or sit in one position for long periods of time. Do not sit with your legs crossed. Rest with your legs raised during the day.  Your legs have to be higher than your heart so that gravity will force the valves to open, so please really elevate your legs.   Wear elastic stockings or support hose. Do not wear other tight, encircling garments around the legs, pelvis, or waist.  [employer]  has a wide variety of well priced compression stockings. [address], [place] [address] #[phone]  Or [employer] has a good cheap selection, I like the socks, they are not as hard to get on  Walk as much as possible to increase blood flow.  Raise the foot of your bed at night with 2-inch blocks.  SEEK MEDICAL CARE IF:   The skin around your ankle starts to break down.  You have pain, redness, tenderness, or hard swelling developing in your leg over a vein.  You are uncomfortable due to leg pain.  If you ever have shortness of breath with exertion or chest pain go to the ER.      When it comes to diets, agreement about the perfect plan isn't easy to find, even among the experts. Experts at the [employer] developed an idea known as the Healthy Eating Plate. Just imagine a plate divided into logical, healthy portions.  The emphasis is on diet quality:  Load up on vegetables and fruits - one-half of your plate: Aim for color and variety, and remember that potatoes don't count.  Go for whole  grains - one-quarter of your plate: Whole wheat, barley, wheat berries, quinoa, oats, brown rice, and foods made with them. If you want pasta, go with whole wheat pasta.  Protein power - one-quarter of your plate: Fish, chicken, beans, and nuts are all healthy, versatile protein sources. Limit red meat.  The diet, however, does go beyond the plate, offering a few other suggestions.  Use healthy plant oils, such as olive, canola, soy, corn, sunflower and peanut. Check the labels, and avoid partially hydrogenated oil, which have unhealthy trans fats.  If you're thirsty, drink water. Coffee and tea are good in moderation, but skip sugary drinks and limit milk and dairy products to one or two daily servings.  The type of carbohydrate in the diet is more important than the amount. Some sources of carbohydrates, such as vegetables, fruits, whole grains, and beans-are healthier than others.  Finally, stay active.  

## 2019-07-26 LAB — COMPLETE METABOLIC PANEL WITH GFR
AG Ratio: 1.6 (calc) (ref 1.0–2.5)
ALT: 24 U/L (ref 9–46)
AST: 26 U/L (ref 10–35)
Albumin: 4.1 g/dL (ref 3.6–5.1)
Alkaline phosphatase (APISO): 68 U/L (ref 35–144)
BUN: 13 mg/dL (ref 7–25)
CO2: 24 mmol/L (ref 20–32)
Calcium: 9.3 mg/dL (ref 8.6–10.3)
Chloride: 106 mmol/L (ref 98–110)
Creat: 0.91 mg/dL (ref 0.70–1.33)
GFR, Est African American: 107 mL/min/{1.73_m2} (ref >=60)
GFR, Est Non African American: 93 mL/min/{1.73_m2} (ref >=60)
Globulin: 2.5 g/dL (calc) (ref 1.9–3.7)
Glucose, Bld: 115 mg/dL — ABNORMAL HIGH (ref 65–99)
Potassium: 4.1 mmol/L (ref 3.5–5.3)
Sodium: 138 mmol/L (ref 135–146)
Total Bilirubin: 1.1 mg/dL (ref 0.2–1.2)
Total Protein: 6.6 g/dL (ref 6.1–8.1)

## 2019-07-26 LAB — CBC WITH DIFFERENTIAL/PLATELET
Absolute Monocytes: 687 cells/uL (ref 200–950)
Basophils Absolute: 41 cells/uL (ref 0–200)
Basophils Relative: 0.6 %
Eosinophils Absolute: 340 cells/uL (ref 15–500)
Eosinophils Relative: 5 %
HCT: 45.7 % (ref 38.5–50.0)
Hemoglobin: 15.5 g/dL (ref 13.2–17.1)
Lymphs Abs: 925 cells/uL (ref 850–3900)
MCH: 29.8 pg (ref 27.0–33.0)
MCHC: 33.9 g/dL (ref 32.0–36.0)
MCV: 87.7 fL (ref 80.0–100.0)
MPV: 10.4 fL (ref 7.5–12.5)
Monocytes Relative: 10.1 %
Neutro Abs: 4808 cells/uL (ref 1500–7800)
Neutrophils Relative %: 70.7 %
Platelets: 257 10*3/uL (ref 140–400)
RBC: 5.21 10*6/uL (ref 4.20–5.80)
RDW: 13.3 % (ref 11.0–15.0)
Total Lymphocyte: 13.6 %
WBC: 6.8 10*3/uL (ref 3.8–10.8)

## 2019-07-26 LAB — LIPID PANEL
Cholesterol: 140 mg/dL (ref <200)
HDL: 44 mg/dL (ref >=40)
LDL Cholesterol (Calc): 75 mg/dL (calc)
Non-HDL Cholesterol (Calc): 96 mg/dL (calc) (ref <130)
Total CHOL/HDL Ratio: 3.2 (calc) (ref <5.0)
Triglycerides: 129 mg/dL (ref <150)

## 2019-07-26 LAB — VITAMIN B12: Vitamin B-12: 1719 pg/mL — ABNORMAL HIGH (ref 200–1100)

## 2019-07-26 LAB — TSH: TSH: 1.9 mIU/L (ref 0.40–4.50)

## 2019-07-26 LAB — VITAMIN D 25 HYDROXY (VIT D DEFICIENCY, FRACTURES): Vit D, 25-Hydroxy: 87 ng/mL (ref 30–100)

## 2019-08-19 ENCOUNTER — Ambulatory Visit: Payer: 59 | Admitting: Adult Health

## 2019-08-19 ENCOUNTER — Encounter: Payer: Self-pay | Admitting: Adult Health

## 2019-08-19 ENCOUNTER — Other Ambulatory Visit: Payer: Self-pay

## 2019-08-19 VITALS — BP 118/78 | HR 73 | Temp 97.0°F | Wt 277.0 lb

## 2019-08-19 DIAGNOSIS — J069 Acute upper respiratory infection, unspecified: Secondary | ICD-10-CM

## 2019-08-19 MED ORDER — PREDNISONE 20 MG PO TABS: ORAL_TABLET | ORAL | 0 refills | Status: DC

## 2019-08-19 MED ORDER — ALBUTEROL SULFATE (2.5 MG/3ML) 0.083% IN NEBU: 2.5000 mg | INHALATION_SOLUTION | Freq: Four times a day (QID) | RESPIRATORY_TRACT | 12 refills | Status: DC | PRN

## 2019-08-19 MED ORDER — PROMETHAZINE-DM 6.25-15 MG/5ML PO SYRP: 5.0000 mL | ORAL_SOLUTION | Freq: Four times a day (QID) | ORAL | 1 refills | Status: DC | PRN

## 2019-08-19 NOTE — Patient Instructions (Signed)
Consider adding vitamin C 500 mg  Zinc 50 mg for men 30-40 mg for women    HOW TO TREAT VIRAL COUGH AND COLD SYMPTOMS:  -Symptoms usually last at least 1 week with the worst symptoms being around day 4.  - colds usually start with a sore throat and end with a cough, and the cough can take 2 weeks to get better.  -No antibiotics are needed for colds, flu, sore throats, cough, bronchitis UNLESS symptoms are longer than 7 days OR if you are getting better then get drastically worse.  -There are a lot of combination medications (Dayquil, Nyquil, Vicks 44, tyelnol cold and sinus, ETC). Please look at the ingredients on the back so that you are treating the correct symptoms and not doubling up on medications/ingredients.    Medicines you can use  Nasal congestion  Little Remedies saline spray (aerosol/mist)- can try this, it is in the kids section - pseudoephedrine (Sudafed)- behind the counter, do not use if you have high blood pressure, medicine that have -D in them.  - phenylephrine (Sudafed PE) -Dextormethorphan + chlorpheniramine (Coridcidin HBP)- okay if you have high blood pressure -Oxymetazoline (Afrin) nasal spray- LIMIT to 3 days -Saline nasal spray -Neti pot (used distilled or bottled water)  Ear pain/congestion  -pseudoephedrine (sudafed) - Nasonex/flonase nasal spray  Fever  -Acetaminophen (Tyelnol) -Ibuprofen (Advil, motrin, aleve)  Sore Throat  -Acetaminophen (Tyelnol) -Ibuprofen (Advil, motrin, aleve) -Drink a lot of water -Gargle with salt water - Rest your voice (don't talk) -Throat sprays -Cough drops  Body Aches  -Acetaminophen (Tyelnol) -Ibuprofen (Advil, motrin, aleve)  Headache  -Acetaminophen (Tyelnol) -Ibuprofen (Advil, motrin, aleve) - Exedrin, Exedrin Migraine  Allergy symptoms (cough, sneeze, runny nose, itchy eyes) -Claritin or loratadine cheapest but likely the weakest  -Zyrtec or certizine at night because it can make you sleepy -The  strongest is allegra or fexafinadine  Cheapest at [employer], [employer], [employer]  Cough  -Dextromethorphan (Delsym)- medicine that has DM in it -Guafenesin (Mucinex/Robitussin) - cough drops - drink lots of water  Chest Congestion  -Guafenesin (Mucinex/Robitussin)  Red Itchy Eyes  - Naphcon-A  Upset Stomach  - Bland diet (nothing spicy, greasy, fried, and high acid foods like tomatoes, oranges, berries) -OKAY- cereal, bread, soup, crackers, rice -Eat smaller more frequent meals -reduce caffeine, no alcohol -Loperamide (Imodium-AD) if diarrhea -Prevacid for heart burn  General health when sick  -Hydration -wash your hands frequently -keep surfaces clean -change pillow cases and sheets often -Get fresh air but do not exercise strenuously -Vitamin D, double up on it - Vitamin C -Zinc

## 2019-08-19 NOTE — Progress Notes (Signed)
Assessment and Plan:  David Fuller was seen today for acute visit.  Diagnoses and all orders for this visit:  Viral URI with cough Vs mild allergic rhinitis flare; continue daily allergy pill Advised viral duration typically 5-7 days; day 3-4 worst symptoms Wear a mask prior to working in yard during allergy season Discussed the importance of avoiding unnecessary antibiotic therapy. Suggested symptomatic OTC remedies. Add vitamin C, zinc for immune support; continue vitamin D Nasal saline spray and nasal steroid for congestion. Oral steroids and albuterol ordered to hold and take should symptoms progressing towards asthma flare Follow up as needed if new fever/chills, increasingly productive, or symptoms persistent past 10 days -     predniSONE (DELTASONE) 20 MG tablet; 2 tablets daily for 3 days, 1 tablet daily for 4 days. -     promethazine-dextromethorphan (PROMETHAZINE-DM) 6.25-15 MG/5ML syrup; Take 5 mLs by mouth 4 (four) times daily as needed for cough. -     albuterol (PROVENTIL) (2.5 MG/3ML) 0.083% nebulizer solution; Take 3 mLs (2.5 mg total) by nebulization every 6 (six) hours as needed for wheezing or shortness of breath.  Further disposition pending results of labs. Discussed med's effects and SE's.   Over 15 minutes of exam, counseling, chart review, and critical decision making was performed.   Future Appointments  Date Time Provider [place]  01/25/2020 10:00 AM David Mutters, PA-C [facility] None    ------------------------------------------------------------------------------------------------------------------   HPI BP 118/78   Pulse 73   Temp (!) 97 F (36.1 C)   Wt 277 lb (125.6 kg)   SpO2 95%   BMI 36.55 kg/m   58 y.o.male morbid obese with hx of allergies and asthma presents for evaluation for URI sx  He reports after a day outdoor raking last weekend and he was around his grandson who had a runny nose; within 48 hours he reports he started having  running nose and post nasal drip followed by a mild sore throat which quickly resolved. Reports has had a mild intermittent cough and wheeze when walking outdoors. He denies fever/chills, HA, sinus/ear pressure, rash, myalgias. He denies changes in taste/smell. He reports symtpoms are stable and not significantly worse. Denies CP, fatigue, dyspnea.    He has tried robitussin DM, took last night and coughed up small quantity of thick secretions.   He has hx of allergies and mild intermittent asthma; he takes generic OTC allergy med daily and flonase. Doesn't have albuterol at home.   He states he feels fairly well, "I'm only here because my wife made me."   Past Medical History:  Diagnosis Date  . Allergy   . Asthma   . Diverticulitis   . Diverticulitis 09/17/2015  . Essential hypertension   . Hemorrhoid   . Hyperlipidemia   . Kidney stones   . Mild dilation of ascending aorta (HCC)   . Obesity   . Obstructive sleep apnea 10/16/2010   Qualifier: Diagnosis of  By: Gwenette Greet MD, Armando Reichert  Not on CPAP    . Seasonal allergies 08/24/2013  . Swollen lymph nodes    abd, groin,      Allergies  Allergen Reactions  . Shellfish Allergy Nausea Only  . Iodine Other (See Comments)    Unknown Unknown Allergic to shellfish - nausea    Current Outpatient Medications on File Prior to Visit  Medication Sig  . Cholecalciferol (VITAMIN D3 PO) Take 6,000 Units by mouth daily.   . fluticasone (FLONASE) 50 MCG/ACT nasal spray Place 2 sprays into both  nostrils as needed for allergies.  . Magnesium 400 MG CAPS Take 1 tablet by mouth daily.  . metoprolol tartrate (LOPRESSOR) 25 MG tablet Take 0.5 tablets (12.5 mg total) by mouth 2 (two) times daily.  [name] Kitchen OVER THE COUNTER MEDICATION Takes One a Day Men's Health formula MVI 1 daily.  . rosuvastatin (CRESTOR) 5 MG tablet Take 1 tablet (5 mg total) by mouth daily.  . verapamil (CALAN-SR) 120 MG CR tablet Take 1 pill daily   No current facility-administered  medications on file prior to visit.     ROS: all negative except above.   Physical Exam:  BP 118/78   Pulse 73   Temp (!) 97 F (36.1 C)   Wt 277 lb (125.6 kg)   SpO2 95%   BMI 36.55 kg/m   General Appearance: Well nourished, obese male, well dressed in no apparent distress. Eyes: PERRLA, EOMs, conjunctiva no swelling or erythema Sinuses: No Frontal/maxillary tenderness ENT/Mouth: Ext aud canals clear, TMs without erythema, bulging. No erythema, swelling, or exudate on post pharynx.  Tonsils not swollen or erythematous. He has a few tonsil stones bilaterally. Hearing normal.  Neck: Supple, thyroid normal.  Respiratory: Respiratory effort normal, BS equal bilaterally. Mildly coarse lung sounds throughout without specific rales, wheezing. No stridor.  Cardio: RRR with no MRGs. Brisk peripheral pulses without edema.  Abdomen: Soft, obese abdomen, + BS.  Non tender, no guarding, rebound Lymphatics: Non tender without lymphadenopathy.  Musculoskeletal: No obvious deformity, normal gait.  Skin: Warm, dry without rashes, lesions, ecchymosis.  Neuro: Cranial nerves intact. Normal muscle tone, no cerebellar symptoms. Sensation intact.  Psych: Awake and oriented X 3, normal affect, Insight and Judgment appropriate.     Izora Ribas, NP 9:54 AM [name]

## 2019-09-12 ENCOUNTER — Ambulatory Visit: Payer: 59 | Admitting: Podiatry

## 2019-09-12 ENCOUNTER — Other Ambulatory Visit: Payer: Self-pay

## 2019-09-12 VITALS — BP 172/117 | HR 78

## 2019-09-12 DIAGNOSIS — L600 Ingrowing nail: Secondary | ICD-10-CM | POA: Diagnosis not present

## 2019-09-12 MED ORDER — GENTAMICIN SULFATE 0.1 % EX CREA: 1.0000 "application " | TOPICAL_CREAM | Freq: Two times a day (BID) | CUTANEOUS | 1 refills | Status: DC

## 2019-09-12 MED ORDER — DOXYCYCLINE HYCLATE 100 MG PO TABS: 100.0000 mg | ORAL_TABLET | Freq: Two times a day (BID) | ORAL | 0 refills | Status: DC

## 2019-09-15 ENCOUNTER — Other Ambulatory Visit: Payer: Self-pay | Admitting: Internal Medicine

## 2019-09-15 NOTE — Progress Notes (Signed)
   Subjective: Patient presents today for evaluation of redness and swelling to the medial border of the right great toe that began 3 days ago. Patient is concerned for possible ingrown nail. Touching the toe increases the pain. He has not done anything for treatment. He denies any pain. Patient presents today for further treatment and evaluation.   Past Medical History:  Diagnosis Date  . Allergy   . Asthma   . Diverticulitis   . Diverticulitis 09/17/2015  . Essential hypertension   . Hemorrhoid   . Hyperlipidemia   . Kidney stones   . Mild dilation of ascending aorta (HCC)   . Obesity   . Obstructive sleep apnea 10/16/2010   Qualifier: Diagnosis of  By: Gwenette Greet MD, Armando Reichert  Not on CPAP    . Seasonal allergies 08/24/2013  . Swollen lymph nodes    abd, groin,     Objective:  General: Well developed, nourished, in no acute distress, alert and oriented x3   Dermatology: Skin is warm, dry and supple bilateral. Medial border of the right hallux appears to be erythematous with evidence of an ingrowing nail. Pain on palpation noted to the border of the nail fold. The remaining nails appear unremarkable at this time. There are no open sores, lesions.  Vascular: Dorsalis Pedis artery and Posterior Tibial artery pedal pulses palpable. No lower extremity edema noted.   Neruologic: Grossly intact via light touch bilateral.  Musculoskeletal: Muscular strength within normal limits in all groups bilateral. Normal range of motion noted to all pedal and ankle joints.   Assesement: #1 Paronychia with ingrowing nail medial border right great toe #2 Pain in toe #3 Incurvated nail  Plan of Care:  1. Patient evaluated.  2. Discussed treatment alternatives and plan of care. Explained nail avulsion procedure and post procedure course to patient. 3. Patient opted for permanent partial nail avulsion of the medial border right hallux.  4. Prior to procedure, local anesthesia infiltration utilized  using 3 ml of a 50:50 mixture of 2% plain lidocaine and 0.5% plain marcaine in a normal hallux block fashion and a betadine prep performed.  5. Partial permanent nail avulsion with chemical matrixectomy performed using [id number] applications of phenol followed by alcohol flush.  6. Light dressing applied. 7. Prescription for Doxycycline provided to patient.  8. Prescription for Gentamicin cream provided to patient to use daily with a bandage.  9. Return to clinic in 2 weeks.   Edrick Kins, DPM [facility]  Dr. Edrick Kins, [address]                                        [place], [place] [address]                Office [phone]  Fax [phone]

## 2019-10-05 ENCOUNTER — Ambulatory Visit: Payer: 59 | Admitting: Podiatry

## 2019-10-10 ENCOUNTER — Other Ambulatory Visit: Payer: Self-pay

## 2019-10-10 ENCOUNTER — Ambulatory Visit: Payer: 59 | Admitting: Podiatry

## 2019-10-10 DIAGNOSIS — L600 Ingrowing nail: Secondary | ICD-10-CM

## 2019-10-13 NOTE — Progress Notes (Signed)
   Subjective: 58 y.o. male presents today status post permanent nail avulsion procedure of the medial border of the right hallux that was performed on 09/12/2019. He states he is doing well and the nail is healing well. He denies any drainage or concerns. He has been using the Gentamicin cream and taking the Doxycycline as directed. Patient is here for further evaluation and treatment.   Past Medical History:  Diagnosis Date  . Allergy   . Asthma   . Diverticulitis   . Diverticulitis 09/17/2015  . Essential hypertension   . Hemorrhoid   . Hyperlipidemia   . Kidney stones   . Mild dilation of ascending aorta (HCC)   . Obesity   . Obstructive sleep apnea 10/16/2010   Qualifier: Diagnosis of  By: Gwenette Greet MD, Armando Reichert  Not on CPAP    . Seasonal allergies 08/24/2013  . Swollen lymph nodes    abd, groin,     Objective: Skin is warm, dry and supple. Nail and respective nail fold appears to be healing appropriately. Open wound to the associated nail fold with a granular wound base and moderate amount of fibrotic tissue. Minimal drainage noted. Mild erythema around the periungual region likely due to phenol chemical matricectomy.  Assessment: #1 postop permanent partial nail avulsion medial border right hallux  #2 open wound periungual nail fold of respective digit.   Plan of care: #1 patient was evaluated  #2 debridement of open wound was performed to the periungual border of the respective toe using a currette. Antibiotic ointment and Band-Aid was applied. #3 patient is to return to clinic on a PRN basis.   Edrick Kins, DPM [facility]  Dr. Edrick Kins, [address]                                        [place],  [address]                Office [phone]  Fax [phone]

## 2019-10-19 ENCOUNTER — Other Ambulatory Visit: Payer: Self-pay

## 2019-10-19 ENCOUNTER — Encounter: Payer: Self-pay | Admitting: Cardiovascular Disease

## 2019-10-19 ENCOUNTER — Ambulatory Visit: Payer: 59 | Admitting: Cardiovascular Disease

## 2019-10-19 VITALS — BP 126/78 | HR 83 | Ht 73.0 in | Wt 276.0 lb

## 2019-10-19 DIAGNOSIS — E785 Hyperlipidemia, unspecified: Secondary | ICD-10-CM

## 2019-10-19 DIAGNOSIS — I10 Essential (primary) hypertension: Secondary | ICD-10-CM | POA: Diagnosis not present

## 2019-10-19 NOTE — Progress Notes (Signed)
Cardiology Office Note    Date:  10/19/2019  ID:  RAYVAUGHN Fuller, DOB 22-Sep-1961, MRN [id number] PCP:  David Cowboy, MD  Cardiologist:  [place], Dr. Elease Hashimoto    Chief Complaint: palpitations       Previous notes per Ronie Spies, PA   David Fuller  is a 58 y.o. male with history of asthma, HTN, HLD, obesity, suspected OSA, seasonal allergies, diverticulitis who is being seen today for the evaluation of palpitations at the request of Dr. Deretha Emory (EDP).  He has no prior cardiac history. He was previously told he had suspected sleep apnea due to an overnight pulse ox test in the past but never had a formal sleep study due to some technical issues with scheduling with pulmonology. In June of this year he noticed sensation of palpitations following a temporary increase in caffeine intake (6 drinks per day). He quit caffeine cold [place] and these resolved. In early September he noticed about 3 days of recurrent palpitations described as skips/flips that felt like a fullness in his throat. This began shortly after he had worked outside in the extreme heat - he thinks he had lost a lot of fluid in sweat. Due to persistent symptoms he was seen in the ED 07/19/17 where he was found to have rare PVCs. Troponin was negative. CBC wnl, BMET showed K 4.7, Cr 0.93, glucose 116. Remote MG/TSH 01/2017 wnl and LDL 146. He was advised to establish care with cardiology. He continues to have occasional palpitations most days of the week. He does not believe he's had any recent tachypalpitations. He does snore. He's been actively trying to lose weight -30lb over the last year. He wants to be healthier to be around for his 2.5-yea-old grandson. He also walks daily and rides his bike every other day. He's not had any recent chest pain, dyspnea, LEE, syncope. Palpitations improve with physical exercise.  Nov. 6, 2018:  Doing better  Still having palpitations Worse with caffiene. Is under lots of stress .    Echocardiogram from October 5 shows normal left ventricular systolic function with EF 65-70%.  He has grade 1 diastolic dysfunction.  There is very minimal ascending aortic dilatation.  Normal pulmonary artery pressures. Tries to watch his salt .   Occasional hot dog.  Has lost 32 lbs over the past year .   Walks , rides his bike   Nov. 6, 2019:  Doing well Had some PVCs Tried verapamil in place of the metoprolol  Checked his electrolytes.  OSA is well controlled.  Wt was 256 lbs   Mood seems to be OK on Celexa 20 mg a day  Exercising regularly .   Joined [employer] - goes 4 times a week  No significant arrhythmias  After workouts  Has been eating salads with chicken for the past several months   Dec. 9, 2020    Will be seen today for follow-up of his premature ventricular contractions and hypertension. Wt is 276 lbs ( (+ 20 lbs from last year)  Is walking 4 miles a day .  Admits to eating too much  Palpitations are well controlled    Past Medical History:  Diagnosis Date  . Allergy   . Asthma   . Diverticulitis   . Diverticulitis 09/17/2015  . Essential hypertension   . Hemorrhoid   . Hyperlipidemia   . Kidney stones   . Mild dilation of ascending aorta (HCC)   . Obesity   . Obstructive sleep  apnea 10/16/2010   Qualifier: Diagnosis of  By: Shelle Iron MD, Maree Krabbe  Not on CPAP    . Seasonal allergies 08/24/2013  . Swollen lymph nodes    abd, groin,     Past Surgical History:  Procedure Laterality Date  . VASECTOMY  MID 90S    Current Medications: No outpatient medications have been marked as taking for the 10/19/19 encounter (Office Visit) with Zaydyn Havey, Deloris Ping, MD.     Allergies:   Shellfish allergy and Iodine   Social History   Socioeconomic History  . Marital status: Married    Spouse name: Not on file  . Number of children: Not on file  . Years of education: Not on file  . Highest education level: Not on file  Occupational History  . Not on file   Social Needs  . Financial resource strain: Not on file  . Food insecurity    Worry: Not on file    Inability: Not on file  . Transportation needs    Medical: Not on file    Non-medical: Not on file  Tobacco Use  . Smoking status: Never Smoker  . Smokeless tobacco: Former Neurosurgeon    Types: Chew  Substance and Sexual Activity  . Alcohol use: Yes    Alcohol/week: 1.0 - 2.0 standard drinks    Types: 1 - 2 Cans of beer per week    Comment: 2OZ A WEEK  . Drug use: No  . Sexual activity: Yes  Lifestyle  . Physical activity    Days per week: Not on file    Minutes per session: Not on file  . Stress: Not on file  Relationships  . Social Musician on phone: Not on file    Gets together: Not on file    Attends religious service: Not on file    Active member of club or organization: Not on file    Attends meetings of clubs or organizations: Not on file    Relationship status: Not on file  Other Topics Concern  . Not on file  Social History Narrative  . Not on file     Family History:  Family History  Problem Relation Age of Onset  . Diabetes Father   . Heart disease Father   . Heart failure Father   . Cancer Brother        Melanoma- left arm  . Atrial fibrillation Mother   . Stroke Mother     ROS:   Please see the history of present illness.  All other systems are reviewed and otherwise negative.   Physical Exam: There were no vitals taken for this visit.  GEN:  Well nourished, well developed in no acute distress HEENT: Normal NECK: No JVD; No carotid bruits LYMPHATICS: No lymphadenopathy CARDIAC: RRR , no murmurs, rubs, gallops RESPIRATORY:  Clear to auscultation without rales, wheezing or rhonchi  ABDOMEN: Soft, non-tender, non-distended MUSCULOSKELETAL:  No edema; No deformity  SKIN: Warm and dry NEUROLOGIC:  Alert and oriented x 3    Wt Readings from Last 3 Encounters:  08/19/19 277 lb (125.6 kg)  07/25/19 272 lb (123.4 kg)  01/19/19 272 lb  9.6 oz (123.7 kg)      Studies/Labs Reviewed:   EKG:   Recent Labs: 01/19/2019: Magnesium 2.1 07/25/2019: ALT 24; BUN 13; Creat 0.91; Hemoglobin 15.5; Platelets 257; Potassium 4.1; Sodium 138; TSH 1.90   Lipid Panel    Component Value Date/Time   CHOL 140 07/25/2019 0911  CHOL 165 12/16/2018 0931   TRIG 129 07/25/2019 0911   HDL 44 07/25/2019 0911   HDL 50 12/16/2018 0931   CHOLHDL 3.2 07/25/2019 0911   VLDL 18 01/13/2017 1445   LDLCALC 75 07/25/2019 0911    Additional studies/ records that were reviewed today include: Summarized above.   ASSESSMENT & PLAN:    1.  Palpitations -no symptoms of palpitations.   2.  Essential HTN -  BP looks good. He has gained 20 lbs over the past year.   Walks 4 miles a day .   Admits to not eating well   3.  Hyperlipidemia -   Lipids from Sept. 2020 look good.   4.  Snoring/suspected OSA-  Wears his CPAP    Will see him in  1 year     Medication Adjustments/Labs and Tests Ordered: Current medicines are reviewed at length with the patient today.  Concerns regarding medicines are outlined above. Medication changes, Labs and Tests ordered today are summarized above and listed in the Patient Instructions accessible in Encounters.   Signed, Kristeen Miss, MD  10/19/2019 3:15 PM    [facility] [address] [place], [place], [place]  [address] Phone: [phone]; Fax: [phone]

## 2019-10-19 NOTE — Patient Instructions (Signed)
Medication Instructions:  Your physician recommends that you continue on your current medications as directed. Please refer to the Current Medication list given to you today.  *If you need a refill on your cardiac medications before your next appointment, please call your pharmacy*  Lab Work: None Ordered   Testing/Procedures: None Ordered   Follow-Up: At [facility], you and your health needs are our priority.  As part of our continuing mission to provide you with exceptional heart care, we have created designated Provider Care Teams.  These Care Teams include your primary Cardiologist (physician) and Advanced Practice Providers (APPs -  Physician Assistants and Nurse Practitioners) who all work together to provide you with the care you need, when you need it.  Your next appointment:   1 year(s)  The format for your next appointment:   In Person  Provider:   You may see Dr. Acie Fredrickson or one of the following Advanced Practice Providers on your designated Care Team:    Richardson Dopp, PA-C  [place], [place]  Daune Perch, [place]

## 2020-01-25 ENCOUNTER — Encounter: Payer: Self-pay | Admitting: Physician Assistant

## 2020-02-07 ENCOUNTER — Other Ambulatory Visit: Payer: Self-pay | Admitting: Physician Assistant

## 2020-02-07 DIAGNOSIS — I493 Ventricular premature depolarization: Secondary | ICD-10-CM

## 2020-02-07 DIAGNOSIS — R002 Palpitations: Secondary | ICD-10-CM

## 2020-02-07 DIAGNOSIS — I10 Essential (primary) hypertension: Secondary | ICD-10-CM

## 2020-02-16 NOTE — Progress Notes (Signed)
Complete Physical  Assessment and Plan: Asthma, unspecified asthma severity, uncomplicated Better with singulair, albuterol, but has cleaned out humidifer from house and doing better.   Hyperlipidemia -continue medications, check lipids, decrease fatty foods, increase activity.  - Lipid panel - EKG 12-Lead  Obstructive sleep apnea Weight loss advised Continue BiPAP  Seasonal allergies Continue medications  Encounter for general adult medical examination with abnormal findings   Essential hypertension - continue medications, DASH diet, exercise and monitor at home. Call if greater than 130/80.  - CBC with Differential/Platelet - BASIC METABOLIC PANEL WITH GFR - TSH - Hepatic function panel  Morbid Obesity Obesity with co morbidities- long discussion about weight loss, diet, and exercise   History of nephrolithiasis Increase fluids   Diverticulitis of intestine without perforation or abscess without bleeding Continue fiber  Abnormal glucose Discussed general issues about diabetes pathophysiology and management., Educational material distributed., Suggested low cholesterol diet., Encouraged aerobic exercise., Discussed foot care., Reminded to get yearly retinal exam. - Insulin, fasting   Medication management - Magnesium  Vitamin D deficiency - Vit D  25 hydroxy (rtn osteoporosis monitoring)  Screening for prostate cancer - PSA  . Discussed med's effects and SE's. Screening labs and tests as requested with regular follow-up as recommended. No future appointments.  HPI Patient presents for a complete physical.   BMI is Body mass index is 37.43 kg/m., he is working on diet and exercise. Has sleep apnea, suppose to be on biPAP optimal pressure 17/13, wears nightly, has been on it for 2 years, wears nasal pillows. States it helps with daytime energy and feels much better.   Wt Readings from Last 3 Encounters:  02/20/20 276 lb (125.2 kg)  10/19/19 276 lb (125.2  kg)  08/19/19 277 lb (125.6 kg)   He follows with Dr. Acie Fredrickson for palpitations in cardiology. Echo Oct 2020 showed normal EF 65-70%.  He has grade 1 diastolic dysfunction.  There is very minimal ascending aortic dilatation.  Normal pulmonary artery pressures.    His blood pressure has been controlled at home, today their BP isBP: 124/72 He does workout, walks 3 miles 6 days a week and bikes 12 miles 4 nights a week. He denies chest pain, shortness of breath, dizziness.  He is on cholesterol medication, crestor 5mg  and denies myalgias. His cholesterol is at goal. The cholesterol last visit was:   Lab Results  Component Value Date   CHOL 140 07/25/2019   HDL 44 07/25/2019   LDLCALC 75 07/25/2019   TRIG 129 07/25/2019   CHOLHDL 3.2 07/25/2019   Last A1C in the office was:  Lab Results  Component Value Date   HGBA1C 5.1 09/14/2018   Patient is on Vitamin D supplement- has been on treatment.   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  Mom passed 2017 He has asthma/allergies and is on allegra, and has albuterol which helps.   Current Medications:    Current Outpatient Medications (Cardiovascular):  .  metoprolol tartrate (LOPRESSOR) 25 MG tablet, TAKE (1/2) TABLET TWICE DAILY. .  rosuvastatin (CRESTOR) 5 MG tablet, Take 1 tablet (5 mg total) by mouth daily. .  verapamil (CALAN-SR) 120 MG CR tablet, TAKE 1 TABLET  ONCE DAILY.  Current Outpatient Medications (Respiratory):  .  fluticasone (FLONASE) 50 MCG/ACT nasal spray, Place 2 sprays into both nostrils as needed for allergies.    Current Outpatient Medications (Other):  [name] Kitchen  Cholecalciferol (VITAMIN D3 PO), Take 6,000 Units by mouth daily.  .  Magnesium 400 MG CAPS, Take 1 tablet by mouth  daily. [name] Kitchen  OVER THE COUNTER MEDICATION, Takes One a Day Men's Health formula MVI 1 daily.  Health Maintenance:  Immunization History  Administered Date(s) Administered  . DTaP 08/25/2011  . Influenza Inj Mdck Quad With Preservative 08/19/2017, 09/14/2018  . Influenza Whole 08/29/2013   Tetanus: 2012 Pneumovax: N/A Prevnar 13: N/A Flu vaccine:2020 Zostavax: N/A DEXA: N/A Colonoscopy: 08/2016 due 10 years EGD: N/A CXR 2016  Patient Care Team: [name], MD as PCP - General (Internal Medicine) Nahser, Wonda Cheng, MD as PCP - Cardiology (Cardiology) Druscilla Brownie, MD as Consulting Physician (Dermatology) Richmond Campbell, MD as Consulting Physician (Gastroenterology) Monna Fam, MD as Consulting Physician (Ophthalmology)   Medical History:  Past Medical History:  Diagnosis Date  . Allergy   . Asthma   . Diverticulitis   . Diverticulitis 09/17/2015  . Essential hypertension   . Hemorrhoid   . Hyperlipidemia   . Kidney stones   . Mild dilation of ascending aorta (HCC)   . Obesity   . Obstructive sleep apnea 10/16/2010   Qualifier: Diagnosis of  By: Gwenette Greet MD, Armando Reichert  Not on CPAP    . 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   . Seasonal allergies 08/24/2013  . Swollen lymph nodes    abd, groin,    Allergies Allergies  Allergen Reactions  . Shellfish Allergy Nausea Only  . Iodine Other (See Comments)    Unknown Unknown Allergic to shellfish - nausea    SURGICAL HISTORY He  has a past surgical history that includes Vasectomy (MID 90S). FAMILY HISTORY His family history includes Atrial fibrillation in his mother; Cancer in his brother; Diabetes in his father; Heart disease in his father; Heart failure in his father; Stroke in his mother. SOCIAL HISTORY He  reports that he has never smoked. He has quit using smokeless tobacco.  His smokeless tobacco use  included chew. He reports current alcohol use of about 1.0 - 2.0 standard drinks of alcohol per week. He reports that he does not use drugs.  Review of Systems  Constitutional: Negative.   HENT: Negative.   Eyes: Negative.   Respiratory: Negative.   Cardiovascular: Negative.   Gastrointestinal: Negative.   Genitourinary: Negative.   Musculoskeletal: Negative.   Skin: Negative.   Neurological: Negative.   Psychiatric/Behavioral: Negative.     Physical Exam: Estimated body mass index is 37.43 kg/m as calculated from the following:   Height as of this encounter: 6' (1.829 m).   Weight as of this encounter: 276 lb (125.2 kg). BP 124/72   Pulse 72   Temp (!) 97.5 F (36.4 C)   Ht 6' (1.829 m)   Wt 276 lb (125.2 kg)   SpO2 98%   BMI 37.43 kg/m  General Appearance: Well nourished, in no apparent distress.  Eyes: PERRLA, EOMs, conjunctiva no swelling or erythema, normal fundi and vessels.  Sinuses: No Frontal/maxillary tenderness  ENT/Mouth: Ext aud canals clear, normal light reflex with TMs without erythema, bulging. Good dentition. No erythema, swelling, or exudate on post pharynx. Tonsils not swollen or erythematous. Hearing normal.  Neck: Supple, thyroid normal. No bruits  Respiratory: Respiratory effort normal, BS equal bilaterally without rales, rhonchi, wheezing or stridor.  Cardio: RRR without murmurs, rubs or gallops. Brisk peripheral pulses without edema.  Chest: symmetric, with normal excursions and percussion.  Abdomen: Soft, nontender, obese,  no guarding, rebound, hernias, masses, or organomegaly.  Lymphatics: Non tender without lymphadenopathy.  Genitourinary: defer Musculoskeletal: Full ROM all peripheral extremities,5/5 strength, and normal gait.  Skin: Warm, dry without rashes, lesions, ecchymosis. Neuro: Cranial nerves intact, reflexes equal bilaterally. Normal muscle tone, no cerebellar symptoms. Sensation intact.  Psych: Awake and oriented X 3, normal affect,  Insight and Judgment appropriate.   EKG: declines, follows cardiology  Vicie Mutters 10:17 AM [facility]

## 2020-02-20 ENCOUNTER — Encounter: Payer: Self-pay | Admitting: Physician Assistant

## 2020-02-20 ENCOUNTER — Ambulatory Visit (INDEPENDENT_AMBULATORY_CARE_PROVIDER_SITE_OTHER): Payer: 59 | Admitting: Physician Assistant

## 2020-02-20 ENCOUNTER — Other Ambulatory Visit: Payer: Self-pay

## 2020-02-20 VITALS — BP 124/72 | HR 72 | Temp 97.5°F | Ht 72.0 in | Wt 276.0 lb

## 2020-02-20 DIAGNOSIS — R7309 Other abnormal glucose: Secondary | ICD-10-CM

## 2020-02-20 DIAGNOSIS — G4733 Obstructive sleep apnea (adult) (pediatric): Secondary | ICD-10-CM

## 2020-02-20 DIAGNOSIS — E349 Endocrine disorder, unspecified: Secondary | ICD-10-CM

## 2020-02-20 DIAGNOSIS — Z79899 Other long term (current) drug therapy: Secondary | ICD-10-CM

## 2020-02-20 DIAGNOSIS — K602 Anal fissure, unspecified: Secondary | ICD-10-CM

## 2020-02-20 DIAGNOSIS — Z130 Encounter for screening for diseases of the blood and blood-forming organs and certain disorders involving the immune mechanism: Secondary | ICD-10-CM

## 2020-02-20 DIAGNOSIS — I10 Essential (primary) hypertension: Secondary | ICD-10-CM

## 2020-02-20 DIAGNOSIS — Z125 Encounter for screening for malignant neoplasm of prostate: Secondary | ICD-10-CM

## 2020-02-20 DIAGNOSIS — J45909 Unspecified asthma, uncomplicated: Secondary | ICD-10-CM

## 2020-02-20 DIAGNOSIS — E785 Hyperlipidemia, unspecified: Secondary | ICD-10-CM

## 2020-02-20 DIAGNOSIS — Z0001 Encounter for general adult medical examination with abnormal findings: Secondary | ICD-10-CM

## 2020-02-20 DIAGNOSIS — J302 Other seasonal allergic rhinitis: Secondary | ICD-10-CM

## 2020-02-20 DIAGNOSIS — E559 Vitamin D deficiency, unspecified: Secondary | ICD-10-CM

## 2020-02-20 DIAGNOSIS — F329 Major depressive disorder, single episode, unspecified: Secondary | ICD-10-CM

## 2020-02-20 DIAGNOSIS — I493 Ventricular premature depolarization: Secondary | ICD-10-CM

## 2020-02-20 DIAGNOSIS — E538 Deficiency of other specified B group vitamins: Secondary | ICD-10-CM

## 2020-02-20 DIAGNOSIS — Z87442 Personal history of urinary calculi: Secondary | ICD-10-CM

## 2020-02-20 DIAGNOSIS — Z0000 Encounter for general adult medical examination without abnormal findings: Secondary | ICD-10-CM

## 2020-02-20 DIAGNOSIS — F325 Major depressive disorder, single episode, in full remission: Secondary | ICD-10-CM

## 2020-02-20 DIAGNOSIS — K5790 Diverticulosis of intestine, part unspecified, without perforation or abscess without bleeding: Secondary | ICD-10-CM

## 2020-02-20 HISTORY — DX: Major depressive disorder, single episode, unspecified: F32.9

## 2020-02-20 NOTE — Patient Instructions (Signed)
  Check out  Mini habits for weight loss book  2 free apps for tracking food is myfitness pal  loseit  If you want more structured weight loss that you have to pay for, you can look into  Noom  weight watchers  General eating tips  What to Avoid . Avoid added sugars o Often added sugar can be found in processed foods such as many condiments, dry cereals, cakes, cookies, chips, crisps, crackers, candies, sweetened drinks, etc.  o Read labels and AVOID/DECREASE use of foods with the following in their ingredient list: Sugar, fructose, high fructose corn syrup, sucrose, glucose, maltose, dextrose, molasses, cane sugar, brown sugar, any type of syrup, agave nectar, etc.   . Avoid snacking in between meals- drink water or if you feel you need a snack, pick a high water content snack such as cucumbers, watermelon, or any veggie.  [name] Kitchen Avoid foods made with flour o If you are going to eat food made with flour, choose those made with whole-grains; and, minimize your consumption as much as is tolerable . Avoid processed foods o These foods are generally stocked in the middle of the grocery store.  o Focus on shopping on the perimeter of the grocery.  What to Include . Vegetables o GREEN LEAFY VEGETABLES: Kale, spinach, mustard greens, collard greens, cabbage, broccoli, etc. o OTHER: Asparagus, cauliflower, eggplant, carrots, peas, Brussel sprouts, tomatoes, bell peppers, zucchini, beets, cucumbers, etc. . Grains, seeds, and legumes o Beans: kidney beans, black eyed peas, garbanzo beans, black beans, pinto beans, etc. o Whole, unrefined grains: brown rice, barley, bulgur, oatmeal, etc. . Healthy fats  o Avoid highly processed fats such as vegetable oil o Examples of healthy fats: avocado, olives, virgin olive oil, dark chocolate (?72% Cocoa), nuts (peanuts, almonds, walnuts, cashews, pecans, etc.) o Please still do small amount of these healthy fats, they are dense in calories.  . Low - Moderate  Intake of Animal Sources of Protein o Meat sources: chicken, [place], salmon, tuna. Limit to 4 ounces of meat at one time or the size of your palm. o Consider limiting dairy sources, but when choosing dairy focus on: PLAIN [place] yogurt, cottage cheese, high-protein milk . Fruit o Choose berries

## 2020-02-21 LAB — COMPLETE METABOLIC PANEL WITH GFR
AG Ratio: 1.8 (calc) (ref 1.0–2.5)
ALT: 27 U/L (ref 9–46)
AST: 24 U/L (ref 10–35)
Albumin: 4.5 g/dL (ref 3.6–5.1)
Alkaline phosphatase (APISO): 76 U/L (ref 35–144)
BUN: 18 mg/dL (ref 7–25)
CO2: 27 mmol/L (ref 20–32)
Calcium: 9.3 mg/dL (ref 8.6–10.3)
Chloride: 106 mmol/L (ref 98–110)
Creat: 1.02 mg/dL (ref 0.70–1.33)
GFR, Est African American: 93 mL/min/{1.73_m2} (ref >=60)
GFR, Est Non African American: 80 mL/min/{1.73_m2} (ref >=60)
Globulin: 2.5 g/dL (calc) (ref 1.9–3.7)
Glucose, Bld: 101 mg/dL — ABNORMAL HIGH (ref 65–99)
Potassium: 4.7 mmol/L (ref 3.5–5.3)
Sodium: 140 mmol/L (ref 135–146)
Total Bilirubin: 0.8 mg/dL (ref 0.2–1.2)
Total Protein: 7 g/dL (ref 6.1–8.1)

## 2020-02-21 LAB — LIPID PANEL
Cholesterol: 149 mg/dL (ref <200)
HDL: 43 mg/dL (ref >=40)
LDL Cholesterol (Calc): 89 mg/dL (calc)
Non-HDL Cholesterol (Calc): 106 mg/dL (calc) (ref <130)
Total CHOL/HDL Ratio: 3.5 (calc) (ref <5.0)
Triglycerides: 78 mg/dL (ref <150)

## 2020-02-21 LAB — MICROALBUMIN / CREATININE URINE RATIO
Creatinine, Urine: 194 mg/dL (ref 20–320)
Microalb Creat Ratio: 2 mcg/mg creat (ref <30)
Microalb, Ur: 0.3 mg/dL

## 2020-02-21 LAB — CBC WITH DIFFERENTIAL/PLATELET
Absolute Monocytes: 788 cells/uL (ref 200–950)
Basophils Absolute: 51 cells/uL (ref 0–200)
Basophils Relative: 0.7 %
Eosinophils Absolute: 445 cells/uL (ref 15–500)
Eosinophils Relative: 6.1 %
HCT: 47.1 % (ref 38.5–50.0)
Hemoglobin: 16.1 g/dL (ref 13.2–17.1)
Lymphs Abs: 1161 cells/uL (ref 850–3900)
MCH: 30.3 pg (ref 27.0–33.0)
MCHC: 34.2 g/dL (ref 32.0–36.0)
MCV: 88.7 fL (ref 80.0–100.0)
MPV: 10.1 fL (ref 7.5–12.5)
Monocytes Relative: 10.8 %
Neutro Abs: 4855 cells/uL (ref 1500–7800)
Neutrophils Relative %: 66.5 %
Platelets: 266 10*3/uL (ref 140–400)
RBC: 5.31 10*6/uL (ref 4.20–5.80)
RDW: 12.8 % (ref 11.0–15.0)
Total Lymphocyte: 15.9 %
WBC: 7.3 10*3/uL (ref 3.8–10.8)

## 2020-02-21 LAB — VITAMIN D 25 HYDROXY (VIT D DEFICIENCY, FRACTURES): Vit D, 25-Hydroxy: 67 ng/mL (ref 30–100)

## 2020-02-21 LAB — URINALYSIS, ROUTINE W REFLEX MICROSCOPIC
Bilirubin Urine: NEGATIVE
Glucose, UA: NEGATIVE
Hgb urine dipstick: NEGATIVE
Ketones, ur: NEGATIVE
Leukocytes,Ua: NEGATIVE
Nitrite: NEGATIVE
Protein, ur: NEGATIVE
Specific Gravity, Urine: 1.027 (ref 1.001–1.03)
pH: <=5 (ref 5.0–8.0)

## 2020-02-21 LAB — MAGNESIUM: Magnesium: 2.1 mg/dL (ref 1.5–2.5)

## 2020-02-21 LAB — PSA: PSA: 1 ng/mL (ref <=4.0)

## 2020-02-21 LAB — TSH: TSH: 2.09 mIU/L (ref 0.40–4.50)

## 2020-03-09 ENCOUNTER — Other Ambulatory Visit: Payer: Self-pay | Admitting: Physician Assistant

## 2020-03-09 ENCOUNTER — Other Ambulatory Visit: Payer: Self-pay

## 2020-03-09 DIAGNOSIS — R002 Palpitations: Secondary | ICD-10-CM

## 2020-03-09 DIAGNOSIS — I493 Ventricular premature depolarization: Secondary | ICD-10-CM

## 2020-03-09 DIAGNOSIS — I10 Essential (primary) hypertension: Secondary | ICD-10-CM

## 2020-05-23 NOTE — Progress Notes (Deleted)
Virtual Visit via Telephone Note   This visit type was conducted due to national recommendations for restrictions regarding the COVID-19 Pandemic (e.g. social distancing) in an effort to limit this patient's exposure and mitigate transmission in our community.  Due to his co-morbid illnesses, this patient is at least at moderate risk for complications without adequate follow up.  This format is felt to be most appropriate for this patient at this time.  The patient did not have access to video technology/had technical difficulties with video requiring transitioning to audio format only (telephone).  All issues noted in this document were discussed and addressed.  No physical exam could be performed with this format.  Please refer to the patient's chart for his  consent to telehealth for [facility].  Evaluation Performed:  Follow-up visit  This visit type was conducted due to national recommendations for restrictions regarding the COVID-19 Pandemic (e.g. social distancing).  This format is felt to be most appropriate for this patient at this time.  All issues noted in this document were discussed and addressed.  No physical exam was performed (except for noted visual exam findings with Video Visits).  Please refer to the patient's chart (MyChart message for video visits and phone note for telephone visits) for the patient's consent to telehealth for [facility].  Date:  05/23/2020   ID:  David Fuller, DOB [birth year], MRN [id number]  Patient Location:  Home  Provider location:   [place]  PCP:  [name], MD  Cardiologist:  Mertie Moores, MD  Sleep Medicine:  Fransico Him, MD Electrophysiologist:  None   Chief Complaint:  OSA  History of Present Illness:    David Fuller is a 59 y.o. male who presents via audio/video conferencing for a telehealth visit today.    David Fuller is a 59 y.o. male with a hx of PSG showed moderate obstructive sleep apnea  with an AHI of 23/h mild oxygen saturations as low as 86%. Due to ongoing respiratory events he was not able to be titrated adequately with CPAP and underwent BiPAP titration to 17/13 cm H2O. He never started his BiPAP because he said that he could not tolerate the BiPAP at the sleep lab because his mouth got terribly dry so he decided not to start the BiPAP. At last OV we had a long discussion about BiPAP treatment. I told him that given the moderate severity of his sleep apnea he would likely not benefit from an oral device. We did talk at length about the inspire device but he was not interested in it and he would have to fail BiPAP therapy first. He was willing to try the BiPAP and I explained to him how to adjust the humidity to try to keep his mouth moist. He was started on  auto BiPAP with an IPAP of 6-18 cm, EPAP of 6 cm and pressure support of 4 cm. He is now back for followup.   The patient {does/does not:200015} have symptoms concerning for COVID-19 infection (fever, chills, cough, or new shortness of breath).    Prior CV studies:   The following studies were reviewed today:  ***  Past Medical History:  Diagnosis Date  . Allergy   . Asthma   . Diverticulitis   . Diverticulitis 09/17/2015  . Essential hypertension   . Hemorrhoid   . Hyperlipidemia   . Kidney stones   . Mild dilation of ascending aorta (HCC)   . Obesity   . Obstructive sleep  apnea 10/16/2010   Qualifier: Diagnosis of  By: Gwenette Greet MD, Armando Reichert  Not on CPAP    . 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   . Seasonal allergies 08/24/2013  . Swollen lymph nodes    abd, groin,    Past Surgical History:  Procedure Laterality Date  . VASECTOMY  MID 90S     No outpatient medications have been marked as taking for the 05/29/20 encounter (Appointment) with Sueanne Margarita, MD.     Allergies:   Shellfish allergy and  Iodine   Social History   Tobacco Use  . Smoking status: Never Smoker  . Smokeless tobacco: Former Systems developer    Types: Secondary school teacher  . Vaping Use: Never used  Substance Use Topics  . Alcohol use: Yes    Alcohol/week: 1.0 - 2.0 standard drink    Types: 1 - 2 Cans of beer per week    Comment: 2OZ A WEEK  . Drug use: No     Family Hx: The patient's family history includes Atrial fibrillation in his mother; Cancer in his brother; Diabetes in his father; Heart disease in his father; Heart failure in his father; Stroke in his mother.  ROS:   Please see the history of present illness.    *** All other systems reviewed and are negative.   Labs/Other Tests and Data Reviewed:    Recent Labs: 02/20/2020: ALT 27; BUN 18; Creat 1.02; Hemoglobin 16.1; Magnesium 2.1; Platelets 266; Potassium 4.7; Sodium 140; TSH 2.09   Recent Lipid Panel Lab Results  Component Value Date/Time   CHOL 149 02/20/2020 10:35 AM   CHOL 165 12/16/2018 09:31 AM   TRIG 78 02/20/2020 10:35 AM   HDL 43 02/20/2020 10:35 AM   HDL 50 12/16/2018 09:31 AM   CHOLHDL 3.5 02/20/2020 10:35 AM   LDLCALC 89 02/20/2020 10:35 AM    Wt Readings from Last 3 Encounters:  02/20/20 276 lb (125.2 kg)  10/19/19 276 lb (125.2 kg)  08/19/19 277 lb (125.6 kg)     Objective:    Vital Signs:  There were no vitals taken for this visit.   CONSTITUTIONAL:  Well nourished, well developed male in no*** acute distress.  EYES: anicteric MOUTH: oral mucosa is pink RESPIRATORY: Normal respiratory effort, symmetric expansion CARDIOVASCULAR: No peripheral edema SKIN: No rash, lesions or ulcers MUSCULOSKELETAL: no digital cyanosis NEURO: Cranial Nerves II-XII grossly intact, moves all extremities PSYCH: Intact judgement and insight.  A&O x 3, Mood/affect appropriate   ASSESSMENT & PLAN:    1.  OSA - he patient is tolerating PAP therapy well without any problems. The PAP download was reviewed today and showed an AHI of 0.7/hr on  Auto BiPAP with 100% compliance in using more than 4 hours nightly.  The patient has been using and benefiting from PAP use and will continue to benefit from therapy.     COVID-19 Education: The signs and symptoms of COVID-19 were discussed with the patient and how to seek care for testing (follow up with PCP or arrange E-visit).  The importance of social distancing was discussed today.  Patient Risk:   After full review of this patient's clinical status, I feel that they are at least moderate risk at this time.  Time:   Today, I have spent *** minutes on *** discussing medical problems including *** and reviewing patient's chart including ***.  Medication Adjustments/Labs and Tests  Ordered: Current medicines are reviewed at length with the patient today.  Concerns regarding medicines are outlined above.  Tests Ordered: No orders of the defined types were placed in this encounter.  Medication Changes: No orders of the defined types were placed in this encounter.   Disposition:  Follow up {follow up:15908}  Signed, Fransico Him, MD  05/23/2020 8:35 PM    [facility]

## 2020-05-29 ENCOUNTER — Telehealth: Payer: 59 | Admitting: Cardiology

## 2020-06-09 ENCOUNTER — Other Ambulatory Visit: Payer: Self-pay | Admitting: Physician Assistant

## 2020-06-09 DIAGNOSIS — E785 Hyperlipidemia, unspecified: Secondary | ICD-10-CM

## 2020-06-19 NOTE — Progress Notes (Signed)
Virtual Visit via Telephone Note   This visit type was conducted due to national recommendations for restrictions regarding the COVID-19 Pandemic (e.g. social distancing) in an effort to limit this patient's exposure and mitigate transmission in our community.  Due to his co-morbid illnesses, this patient is at least at moderate risk for complications without adequate follow up.  This format is felt to be most appropriate for this patient at this time.  The patient did not have access to video technology/had technical difficulties with video requiring transitioning to audio format only (telephone).  All issues noted in this document were discussed and addressed.  No physical exam could be performed with this format.  Please refer to the patient's chart for his  consent to telehealth for [facility].  Evaluation Performed:  Follow-up visit  This visit type was conducted due to national recommendations for restrictions regarding the COVID-19 Pandemic (e.g. social distancing).  This format is felt to be most appropriate for this patient at this time.  All issues noted in this document were discussed and addressed.  No physical exam was performed (except for noted visual exam findings with Video Visits).  Please refer to the patient's chart (MyChart message for video visits and phone note for telephone visits) for the patient's consent to telehealth for [facility].  Date:  06/20/2020   ID:  David Fuller, DOB December 12, 1960, MRN [id number]  Patient Location:  Home  Provider location:   [place]  PCP:  [name], MD  Cardiologist:  Mertie Moores, MD  Sleep Medicine:  Fransico Him, MD Electrophysiologist:  None   Chief Complaint:  OSA  History of Present Illness:    David Fuller is a 59 y.o. male who presents via audio/video conferencing for a telehealth visit today.    David Fuller is a 59 y.o. male with a hx of PSG showed moderate obstructive sleep apnea  with an AHI of 23/h mild oxygen saturations as low as 86%. Due to ongoing respiratory events he was not able to be titrated adequately with CPAP and underwent BiPAP titration to 17/13 cm H2O.   He is doing well with his BPAP device and thinks that he has gotten used to it.  He tolerates the nasal pillow mask and feels the pressure is adequate.  Since going on BPAP he feels rested in the am and has no significant daytime sleepiness.  He denies any significant mouth or nasal dryness or nasal congestion.  He does not think that he snores.    The patient does not have symptoms concerning for COVID-19 infection (fever, chills, cough, or new shortness of breath).    Prior CV studies:   The following studies were reviewed today:  PAP compliance download  Past Medical History:  Diagnosis Date  . Allergy   . Asthma   . Diverticulitis   . Diverticulitis 09/17/2015  . Essential hypertension   . Hemorrhoid   . Hyperlipidemia   . Kidney stones   . Mild dilation of ascending aorta (HCC)   . Obesity   . Obstructive sleep apnea 10/16/2010   Qualifier: Diagnosis of  By: Gwenette Greet MD, Armando Reichert  Not on CPAP    . 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   . Seasonal allergies 08/24/2013  . Swollen lymph nodes    abd, groin,    Past Surgical History:  Procedure Laterality Date  . VASECTOMY  MID 90S     Current Meds  Medication Sig  . Ascorbic Acid (VITAMIN C) 100 MG tablet Take 100 mg by mouth daily.  . Cholecalciferol (VITAMIN D3 PO) Take 6,000 Units by mouth daily.   . fluticasone (FLONASE) 50 MCG/ACT nasal spray Place 2 sprays into both nostrils as needed for allergies.  . Magnesium 400 MG CAPS Take 1 tablet by mouth daily.  . metoprolol tartrate (LOPRESSOR) 25 MG tablet Take 1/2 tablet 2 x  /day (every 12 hours) for BP & Heart Rhythm  . rosuvastatin (CRESTOR) 5 MG tablet Take 1 tablet Daily for  Cholesterol  . Thiamine HCl (VITAMIN B-1) 250 MG tablet Take 250 mg by mouth daily.  . verapamil (CALAN-SR) 120 MG CR tablet Take 1 tablet Daily with Food For BP & Heart Rhythm  . Zinc 100 MG TABS Take by mouth.     Allergies:   Shellfish allergy and Iodine   Social History   Tobacco Use  . Smoking status: Never Smoker  . Smokeless tobacco: Former Systems developer    Types: Secondary school teacher  . Vaping Use: Never used  Substance Use Topics  . Alcohol use: Yes    Alcohol/week: 1.0 - 2.0 standard drink    Types: 1 - 2 Cans of beer per week    Comment: 2OZ A WEEK  . Drug use: No     Family Hx: The patient's family history includes Atrial fibrillation in his mother; Cancer in his brother; Diabetes in his father; Heart disease in his father; Heart failure in his father; Stroke in his mother.  ROS:   Please see the history of present illness.     All other systems reviewed and are negative.   Labs/Other Tests and Data Reviewed:    Recent Labs: 02/20/2020: ALT 27; BUN 18; Creat 1.02; Hemoglobin 16.1; Magnesium 2.1; Platelets 266; Potassium 4.7; Sodium 140; TSH 2.09   Recent Lipid Panel Lab Results  Component Value Date/Time   CHOL 149 02/20/2020 10:35 AM   CHOL 165 12/16/2018 09:31 AM   TRIG 78 02/20/2020 10:35 AM   HDL 43 02/20/2020 10:35 AM   HDL 50 12/16/2018 09:31 AM   CHOLHDL 3.5 02/20/2020 10:35 AM   LDLCALC 89 02/20/2020 10:35 AM    Wt Readings from Last 3 Encounters:  06/20/20 268 lb (121.6 kg)  02/20/20 276 lb (125.2 kg)  10/19/19 276 lb (125.2 kg)     Objective:    Vital Signs:  BP 114/70   Pulse 72   Temp 97.6 F (36.4 C)   Resp 12   Ht 6' (1.829 m)   Wt 268 lb (121.6 kg)   BMI 36.35 kg/m     ASSESSMENT & PLAN:    1.  OSA - he patient is tolerating PAP therapy well without any problems. The PAP download was reviewed today and showed an AHI of 0.9/hr on Auto BiPAP with 100% compliance in using more than 4 hours nightly.  The patient has been using and  benefiting from PAP use and will continue to benefit from therapy.   2.  HTN -Continue Lopressor 12.5mg  BID and Verapamil SR 120mg  daily   COVID-19 Education: The signs and symptoms of COVID-19 were discussed with the patient and how to seek care for testing (follow up with PCP or arrange E-visit).  The importance of social distancing was discussed today.  Patient Risk:   After full review of this patient's clinical status, I feel that they are at least  moderate risk at this time.  Time:   Today, I have spent 20 minutes on telemedicine discussing medical problems including OSA and HTN and reviewing patient's chart including PAP compliance download.  Medication Adjustments/Labs and Tests Ordered: Current medicines are reviewed at length with the patient today.  Concerns regarding medicines are outlined above.  Tests Ordered: No orders of the defined types were placed in this encounter.  Medication Changes: No orders of the defined types were placed in this encounter.   Disposition:  Follow up in 1 year(s)  Signed, Fransico Him, MD  06/20/2020 7:56 AM    [facility]

## 2020-06-20 ENCOUNTER — Telehealth (INDEPENDENT_AMBULATORY_CARE_PROVIDER_SITE_OTHER): Payer: 59 | Admitting: Cardiology

## 2020-06-20 ENCOUNTER — Encounter: Payer: Self-pay | Admitting: Cardiology

## 2020-06-20 ENCOUNTER — Other Ambulatory Visit: Payer: Self-pay

## 2020-06-20 VITALS — BP 114/70 | HR 72 | Temp 97.6°F | Resp 12 | Ht 72.0 in | Wt 268.0 lb

## 2020-06-20 DIAGNOSIS — I10 Essential (primary) hypertension: Secondary | ICD-10-CM

## 2020-06-20 DIAGNOSIS — G4733 Obstructive sleep apnea (adult) (pediatric): Secondary | ICD-10-CM | POA: Diagnosis not present

## 2020-06-20 NOTE — Patient Instructions (Signed)

## 2020-08-09 ENCOUNTER — Other Ambulatory Visit: Payer: Self-pay | Admitting: Internal Medicine

## 2020-08-09 DIAGNOSIS — I493 Ventricular premature depolarization: Secondary | ICD-10-CM

## 2020-08-09 DIAGNOSIS — R002 Palpitations: Secondary | ICD-10-CM

## 2020-08-09 DIAGNOSIS — I10 Essential (primary) hypertension: Secondary | ICD-10-CM

## 2020-09-09 ENCOUNTER — Other Ambulatory Visit: Payer: Self-pay | Admitting: Internal Medicine

## 2020-09-09 DIAGNOSIS — E785 Hyperlipidemia, unspecified: Secondary | ICD-10-CM

## 2020-10-07 NOTE — Progress Notes (Deleted)
FOLLOW UP 3 MONTHS   Assessment / Plan:  Diagnoses and all orders for this visit:  Essential hypertension  Hyperlipidemia, unspecified hyperlipidemia type  Obstructive sleep apnea  Depression, major, in remission ([place])  Vitamin D deficiency  B12 deficiency  Morbid obesity (HCC)  Seasonal allergies  Uncomplicated asthma, unspecified asthma severity, unspecified whether persistent  PVC's (premature ventricular contractions)  Diverticulosis  Medication management  Need for immunization against influenza    Asthma, unspecified asthma severity, uncomplicated Better with singulair, albuterol, but has cleaned out humidifer from house and doing better.   Hyperlipidemia -continue medications, check lipids, decrease fatty foods, increase activity.  - Lipid panel - EKG 12-Lead  Obstructive sleep apnea Weight loss advised Continue BiPAP  Seasonal allergies Continue medications  Encounter for general adult medical examination with abnormal findings   Essential hypertension - continue medications, DASH diet, exercise and monitor at home. Call if greater than 130/80.  - CBC with Differential/Platelet - BASIC METABOLIC PANEL WITH GFR - TSH - Hepatic function panel  Morbid Obesity Obesity with co morbidities- long discussion about weight loss, diet, and exercise   History of nephrolithiasis Increase fluids   Diverticulitis of intestine without perforation or abscess without bleeding Continue fiber  Abnormal glucose Discussed general issues about diabetes pathophysiology and management., Educational material distributed., Suggested low cholesterol diet., Encouraged aerobic exercise., Discussed foot care., Reminded to get yearly retinal exam. - Insulin, fasting   Medication management - Magnesium  Vitamin D deficiency - Vit D  25 hydroxy (rtn osteoporosis monitoring)  Screening for prostate cancer - PSA  . Discussed med's effects and SE's. Screening labs  and tests as requested with regular follow-up as recommended. Future Appointments  Date Time Provider [place]  10/10/2020  8:45 AM Garnet Sierras, NP [facility] None  11/13/2020  4:00 PM Nahser, Wonda Cheng, MD [facility]    HPI Patient presents for a complete physical.   BMI is There is no height or weight on file to calculate BMI., he is working on diet and exercise. Has sleep apnea, suppose to be on biPAP optimal pressure 17/13, wears nightly, has been on it for 2 years, wears nasal pillows. States it helps with daytime energy and feels much better.   Wt Readings from Last 3 Encounters:  06/20/20 268 lb (121.6 kg)  02/20/20 276 lb (125.2 kg)  10/19/19 276 lb (125.2 kg)   He follows with Dr. Acie Fredrickson for palpitations in cardiology. Echo Oct 2020 showed normal EF 65-70%.  He has grade 1 diastolic dysfunction.  There is very minimal ascending aortic dilatation.  Normal pulmonary artery pressures.    His blood pressure has been controlled at home, today their BP is  He does workout, walks 3 miles 6 days a week and bikes 12 miles 4 nights a week. He denies chest pain, shortness of breath, dizziness.  He is on cholesterol medication, crestor 5mg  and denies myalgias. His cholesterol is at goal. The cholesterol last visit was:   Lab Results  Component Value Date   CHOL 149 02/20/2020   HDL 43 02/20/2020   LDLCALC 89 02/20/2020   TRIG 78 02/20/2020   CHOLHDL 3.5 02/20/2020   Last A1C in the office was:  Lab Results  Component Value Date   HGBA1C 5.1 09/14/2018   Patient is on Vitamin D supplement- has been on treatment.   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  Mom passed 2017 He has asthma/allergies and is on allegra, and has  albuterol which helps.   Current Medications:    Current Outpatient Medications (Cardiovascular):  .  metoprolol tartrate (LOPRESSOR) 25 MG tablet, TAKE 1/2 TABLET TWICE DAILY (EVERY 12 HOURS) FOR BLOOD PRESSURE & HEART RHYTHM .  rosuvastatin (CRESTOR) 5 MG tablet, Take      1 tablet      Daily       for Cholesterol .  verapamil (CALAN-SR) 120 MG CR tablet, TAKE 1 TABLET DAILY WITH FOOD FOR BLOOD PRESSURE AND HEART RHYTHM  Current Outpatient Medications (Respiratory):  .  fluticasone (FLONASE) 50 MCG/ACT nasal spray, Place 2 sprays into both nostrils as needed for allergies.    Current Outpatient Medications (Other):  [name] Kitchen  Ascorbic Acid (VITAMIN C) 100 MG tablet, Take 100 mg by mouth daily. .  Cholecalciferol (VITAMIN D3 PO), Take 6,000 Units by mouth daily.  .  Magnesium 400 MG CAPS, Take 1 tablet by mouth daily. .  Thiamine HCl (VITAMIN B-1) 250 MG tablet, Take 250 mg by mouth daily. .  Zinc 100 MG TABS, Take by mouth.  Health Maintenance:  Immunization History  Administered Date(s) Administered  . DTaP 08/25/2011  . Influenza Inj Mdck Quad With Preservative 08/19/2017, 09/14/2018  . Influenza Whole 08/29/2013  . Moderna SARS-COVID-2 Vaccination 11/09/2019, 12/07/2019   Tetanus: 2012 Pneumovax: N/A Prevnar 13: N/A Flu vaccine:2020 Zostavax: N/A DEXA: N/A Colonoscopy: 08/2016 due 10 years EGD: N/A CXR 2016  Patient Care Team: [name], MD as PCP - General (Internal Medicine) Nahser, Wonda Cheng, MD as PCP - Cardiology (Cardiology) Sueanne Margarita, MD as PCP - Sleep Medicine (Cardiology) Druscilla Brownie, MD as Consulting Physician (Dermatology) Richmond Campbell, MD as Consulting Physician (Gastroenterology) Monna Fam, MD as Consulting Physician (Ophthalmology)   Medical History:  Past Medical History:  Diagnosis Date  . Allergy   . Asthma   . Diverticulitis   . Diverticulitis 09/17/2015  . Essential hypertension   . Hemorrhoid   . Hyperlipidemia   .  Kidney stones   . Mild dilation of ascending aorta (HCC)   . Obesity   . Obstructive sleep apnea 10/16/2010   Qualifier: Diagnosis of  By: Gwenette Greet MD, Armando Reichert  Not on CPAP    . 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   . Seasonal allergies 08/24/2013  . Swollen lymph nodes    abd, groin,    Allergies Allergies  Allergen Reactions  . Shellfish Allergy Nausea Only  . Iodine Other (See Comments)    Unknown Unknown Allergic to shellfish - nausea    SURGICAL HISTORY He  has a past surgical history that includes Vasectomy (MID 90S). FAMILY HISTORY His family history includes Atrial fibrillation in his mother; Cancer in his brother; Diabetes in his father; Heart disease in his father; Heart failure in his father; Stroke in his mother. SOCIAL HISTORY He  reports that he has never smoked. He has quit using smokeless tobacco.  His smokeless tobacco use included chew. He reports current alcohol use of about 1.0 - 2.0 standard drink of alcohol per week. He reports that he does not use drugs.  Review of Systems  Constitutional: Negative for chills, diaphoresis,  fever, malaise/fatigue and weight loss.  HENT: Negative for congestion, ear discharge, ear pain, hearing loss, nosebleeds, sinus pain, sore throat and tinnitus.   Eyes: Negative for blurred vision, double vision, photophobia, pain, discharge and redness.  Respiratory: Negative for cough, hemoptysis, sputum production, shortness of breath, wheezing and stridor.   Cardiovascular: Negative for chest pain, palpitations, orthopnea, claudication, leg swelling and PND.  Gastrointestinal: Negative for abdominal pain, blood in stool, constipation, diarrhea, heartburn, melena, nausea and vomiting.  Genitourinary: Negative for dysuria, flank pain, frequency, hematuria and urgency.  Musculoskeletal: Negative for back pain, falls, joint pain, myalgias and  neck pain.  Skin: Negative for itching and rash.  Neurological: Negative for dizziness, tingling, tremors, sensory change, speech change, focal weakness, seizures, loss of consciousness, weakness and headaches.  Endo/Heme/Allergies: Negative for environmental allergies and polydipsia. Does not bruise/bleed easily.  Psychiatric/Behavioral: Negative for depression, hallucinations, memory loss, substance abuse and suicidal ideas. The patient is not nervous/anxious and does not have insomnia.     Physical Exam: Estimated body mass index is 36.35 kg/m as calculated from the following:   Height as of 06/20/20: 6' (1.829 m).   Weight as of 06/20/20: 268 lb (121.6 kg). There were no vitals taken for this visit. General Appearance: Well nourished, in no apparent distress.  Eyes: PERRLA, EOMs, conjunctiva no swelling or erythema, normal fundi and vessels.  Sinuses: No Frontal/maxillary tenderness  ENT/Mouth: Ext aud canals clear, normal light reflex with TMs without erythema, bulging. Good dentition. No erythema, swelling, or exudate on post pharynx. Tonsils not swollen or erythematous. Hearing normal.  Neck: Supple, thyroid normal. No bruits  Respiratory: Respiratory effort normal, BS equal bilaterally without rales, rhonchi, wheezing or stridor.  Cardio: RRR without murmurs, rubs or gallops. Brisk peripheral pulses without edema.  Chest: symmetric, with normal excursions and percussion.  Abdomen: Soft, nontender, obese,  no guarding, rebound, hernias, masses, or organomegaly.  Lymphatics: Non tender without lymphadenopathy.  Genitourinary: defer Musculoskeletal: Full ROM all peripheral extremities,5/5 strength, and normal gait.  Skin: Warm, dry without rashes, lesions, ecchymosis. Neuro: Cranial nerves intact, reflexes equal bilaterally. Normal muscle tone, no cerebellar symptoms. Sensation intact.  Psych: Awake and oriented X 3, normal affect, Insight and Judgment appropriate.   EKG: declines,  follows cardiology  Gessica Jawad 10:42 PM [facility]

## 2020-10-10 ENCOUNTER — Ambulatory Visit: Payer: 59 | Admitting: Adult Health Nurse Practitioner

## 2020-10-11 ENCOUNTER — Encounter: Payer: Self-pay | Admitting: Adult Health Nurse Practitioner

## 2020-10-11 ENCOUNTER — Other Ambulatory Visit: Payer: Self-pay

## 2020-10-11 ENCOUNTER — Ambulatory Visit: Payer: 59 | Admitting: Adult Health Nurse Practitioner

## 2020-10-11 VITALS — BP 124/80 | HR 78 | Temp 97.5°F | Wt 267.0 lb

## 2020-10-11 DIAGNOSIS — I10 Essential (primary) hypertension: Secondary | ICD-10-CM

## 2020-10-11 DIAGNOSIS — E785 Hyperlipidemia, unspecified: Secondary | ICD-10-CM

## 2020-10-11 DIAGNOSIS — R7309 Other abnormal glucose: Secondary | ICD-10-CM

## 2020-10-11 DIAGNOSIS — G4733 Obstructive sleep apnea (adult) (pediatric): Secondary | ICD-10-CM

## 2020-10-11 DIAGNOSIS — E559 Vitamin D deficiency, unspecified: Secondary | ICD-10-CM

## 2020-10-11 DIAGNOSIS — J45909 Unspecified asthma, uncomplicated: Secondary | ICD-10-CM

## 2020-10-11 DIAGNOSIS — K5790 Diverticulosis of intestine, part unspecified, without perforation or abscess without bleeding: Secondary | ICD-10-CM

## 2020-10-11 DIAGNOSIS — E538 Deficiency of other specified B group vitamins: Secondary | ICD-10-CM | POA: Diagnosis not present

## 2020-10-11 DIAGNOSIS — F325 Major depressive disorder, single episode, in full remission: Secondary | ICD-10-CM

## 2020-10-11 NOTE — Progress Notes (Signed)
FOLLOW UP 6 MONTH  Assessment and Plan:  Diagnoses and all orders for this visit:  Essential hypertension .Continue current medications: Metoprolol 25mg  half tablet and Verapamil 120mg  Monitor blood pressure at home; call if consistently over 130/80 Continue DASH diet.   Reminder to go to the ER if any CP, SOB, nausea, dizziness, severe HA, changes vision/speech, left arm numbness and tingling and jaw pain. -     CBC with Differential/Platelet -     COMPLETE METABOLIC PANEL WITH GFR  Hyperlipidemia, unspecified hyperlipidemia type Continue medications: Rosuvastatin 5mg  daily Discussed dietary and exercise modifications Low fat diet -     Lipid panel  Vitamin D deficiency Continue supplementation to maintain goal of 70-100 Taking Vitamin D 6,000 IU daily Defer vitamin D level today   B12 deficiency Continue supplementation  Depression, major, in remission (HCC) No medications at this time  Discussed stress management techniques  Discussed, increase water,intake & good sleep hygiene  Discussed increasing exercise & vegetables in diet  Uncomplicated asthma, unspecified asthma severity, unspecified whether persistent Doing well at this time Flonase PRN, well controlled  Obstructive sleep apnea Continue CPAP/BiPAP, using nightly for at least 8 hours  Helping with daytime fatigue Weight loss still advised Discussed mask & tubing hygiene, SoClean  Morbid obesity ([place]) Discussed dietary and exercise modifications Encouraged healthy behaviors Continue Noom & dietary changes  Diverticulosis Doing well at this time  Abnormal glucose Discussed dietary and exercise modifications    Further disposition pending results if labs check today. Discussed med's effects and SE's.   Over 30 minutes of face to face interview, exam, counseling, chart review, and critical decision making was performed.    Future Appointments  Date Time Provider [place]  11/13/2020  4:00  PM Nahser, Wonda Cheng, MD [facility]  02/25/2021  9:00 AM Chauncey Sciulli, Danton Sewer, NP [facility] None    HPI Patient presents for a follow up 6 months for HTN, HLD, abnormal glucose, vitamin D deficiency and weight.  BMI is Body mass index is 36.21 kg/m., he is working on diet and exercise. Has sleep apnea, suppose to be on CPAP optimal pressure 17/13, wears nightly fits well and cleans routinly.  States it helps with daytime energy and feels much better.   He has been doing Noom weight loss.  Also walking 10,000 steps a day min.  Rides his bike 47miles.  Changed some of his eating habits.  He is retired but continues to work part time as needed.   Wt Readings from Last 3 Encounters:  10/11/20 267 lb (121.1 kg)  06/20/20 268 lb (121.6 kg)  02/20/20 276 lb (125.2 kg)   He follows with Dr. Acie Fredrickson for palpitations in cardiology. Echo Oct 2018 showed normal EF 65-70%.  He has grade 1 diastolic dysfunction.  There is very minimal ascending aortic dilatation.  Normal pulmonary artery pressures.    His blood pressure has been controlled at home, today their BP isBP: 124/80 He does workout, walks 3 miles 6 days a week and bikes 12 miles 4 nights a week. He denies chest pain, shortness of breath, dizziness.  He is on cholesterol medication, crestor 5mg  and denies myalgias. His cholesterol is at goal. The cholesterol last visit was:   Lab Results  Component Value Date   CHOL 149 02/20/2020   HDL 43 02/20/2020   LDLCALC 89 02/20/2020   TRIG 78 02/20/2020   CHOLHDL 3.5 02/20/2020   Last A1C in the office was:  Lab Results  Component Value  Date   HGBA1C 5.1 09/14/2018   Patient is on Vitamin D supplement- has been on treatment.   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  Mom passed 2017 He has asthma/allergies and is on allegra, and has albuterol which helps.   Current Medications:    Current Outpatient Medications (Cardiovascular):  .  metoprolol tartrate (LOPRESSOR) 25 MG tablet, TAKE 1/2 TABLET TWICE DAILY (EVERY 12 HOURS) FOR BLOOD PRESSURE & HEART RHYTHM .  rosuvastatin (CRESTOR) 5 MG tablet, Take      1 tablet      Daily       for Cholesterol .  verapamil (CALAN-SR) 120 MG CR tablet, TAKE 1 TABLET DAILY WITH FOOD FOR BLOOD PRESSURE AND HEART RHYTHM  Current Outpatient Medications (Respiratory):  .  fluticasone (FLONASE) 50 MCG/ACT nasal spray, Place 2 sprays into both nostrils as needed for allergies.    Current Outpatient Medications (Other):  [name] Kitchen  Ascorbic Acid (VITAMIN C) 100 MG tablet, Take 100 mg by mouth daily. .  Cholecalciferol (VITAMIN D3 PO), Take 6,000 Units by mouth daily.  .  Magnesium 400 MG CAPS, Take 1 tablet by mouth daily. .  Thiamine HCl (VITAMIN B-1) 250 MG tablet, Take 250 mg by mouth daily. .  Zinc 100 MG TABS, Take by mouth.  Health Maintenance:  Immunization History  Administered Date(s) Administered  . DTaP 08/25/2011  . Influenza Inj Mdck Quad With Preservative 08/19/2017, 09/14/2018  . Influenza Whole 08/29/2013  . Moderna SARS-COVID-2 Vaccination 11/09/2019, 12/07/2019   Tetanus: 2012 Pneumovax: N/A Prevnar 13: N/A Flu vaccine:2020 Zostavax: N/A DEXA: N/A Colonoscopy: 08/2016 due 10 years EGD: N/A CXR 2016  Patient Care Team: [name], MD as PCP - General (Internal Medicine) Nahser, Wonda Cheng, MD as PCP - Cardiology (Cardiology) Sueanne Margarita, MD as PCP - Sleep Medicine (Cardiology) Druscilla Brownie, MD as Consulting Physician (Dermatology) Richmond Campbell, MD as Consulting Physician (Gastroenterology) Monna Fam, MD as Consulting Physician (Ophthalmology)   Medical History:  Past Medical History:  Diagnosis Date  . Allergy   . Asthma   . Diverticulitis   .  Diverticulitis 09/17/2015  . Essential hypertension   . Hemorrhoid   . Hyperlipidemia   . Kidney stones   . Mild dilation of ascending aorta (HCC)   . Obesity   . Obstructive sleep apnea 10/16/2010   Qualifier: Diagnosis of  By: Gwenette Greet MD, Armando Reichert  Not on CPAP    . 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   . Seasonal allergies 08/24/2013  . Swollen lymph nodes    abd, groin,    Allergies Allergies  Allergen Reactions  . Shellfish Allergy Nausea Only  . Iodine Other (See Comments)    Unknown Unknown Allergic to shellfish - nausea    SURGICAL HISTORY He  has a past surgical history that includes Vasectomy (MID 90S). FAMILY HISTORY His family history includes Atrial fibrillation in his mother; Cancer in his brother; Diabetes in his father; Heart disease in his father; Heart failure in his father; Stroke in his mother. SOCIAL HISTORY He  reports that he has never smoked. He  has quit using smokeless tobacco.  His smokeless tobacco use included chew. He reports current alcohol use of about 1.0 - 2.0 standard drink of alcohol per week. He reports that he does not use drugs.  ROS  Physical Exam: Estimated body mass index is 36.21 kg/m as calculated from the following:   Height as of 06/20/20: 6' (1.829 m).   Weight as of this encounter: 267 lb (121.1 kg). BP 124/80   Pulse 78   Temp (!) 97.5 F (36.4 C)   Wt 267 lb (121.1 kg)   SpO2 98%   BMI 36.21 kg/m  General Appearance: Well nourished, in no apparent distress.  Eyes: PERRLA, EOMs, conjunctiva no swelling or erythema, normal fundi and vessels.  Sinuses: No Frontal/maxillary tenderness  ENT/Mouth: Ext aud canals clear, normal light reflex with TMs without erythema, bulging. Good dentition. No erythema, swelling, or exudate on post pharynx. Tonsils not swollen or erythematous. Hearing normal.  Neck: Supple, thyroid normal. No  bruits  Respiratory: Respiratory effort normal, BS equal bilaterally without rales, rhonchi, wheezing or stridor.  Cardio: RRR without murmurs, rubs or gallops. Brisk peripheral pulses without edema.  Chest: symmetric, with normal excursions and percussion.  Abdomen: Soft, nontender, obese,  no guarding, rebound, hernias, masses, or organomegaly.  Lymphatics: Non tender without lymphadenopathy.  Genitourinary: defer Musculoskeletal: Full ROM all peripheral extremities,5/5 strength, and normal gait.  Skin: Warm, dry without rashes, lesions, ecchymosis. Neuro: Cranial nerves intact, reflexes equal bilaterally. Normal muscle tone, no cerebellar symptoms. Sensation intact.  Psych: Awake and oriented X 3, normal affect, Insight and Judgment appropriate.      Garnet Sierras, Laqueta Jean, DNP [facility] 10/11/2020  11:03 AM

## 2020-10-12 LAB — CBC WITH DIFFERENTIAL/PLATELET
Absolute Monocytes: 667 cells/uL (ref 200–950)
Basophils Absolute: 59 cells/uL (ref 0–200)
Basophils Relative: 0.9 %
Eosinophils Absolute: 370 cells/uL (ref 15–500)
Eosinophils Relative: 5.6 %
HCT: 46.3 % (ref 38.5–50.0)
Hemoglobin: 15.9 g/dL (ref 13.2–17.1)
Lymphs Abs: 1360 cells/uL (ref 850–3900)
MCH: 30.7 pg (ref 27.0–33.0)
MCHC: 34.3 g/dL (ref 32.0–36.0)
MCV: 89.4 fL (ref 80.0–100.0)
MPV: 9.8 fL (ref 7.5–12.5)
Monocytes Relative: 10.1 %
Neutro Abs: 4145 cells/uL (ref 1500–7800)
Neutrophils Relative %: 62.8 %
Platelets: 269 10*3/uL (ref 140–400)
RBC: 5.18 10*6/uL (ref 4.20–5.80)
RDW: 13 % (ref 11.0–15.0)
Total Lymphocyte: 20.6 %
WBC: 6.6 10*3/uL (ref 3.8–10.8)

## 2020-10-12 LAB — COMPLETE METABOLIC PANEL WITH GFR
AG Ratio: 1.9 (calc) (ref 1.0–2.5)
ALT: 31 U/L (ref 9–46)
AST: 26 U/L (ref 10–35)
Albumin: 4.4 g/dL (ref 3.6–5.1)
Alkaline phosphatase (APISO): 70 U/L (ref 35–144)
BUN: 15 mg/dL (ref 7–25)
CO2: 21 mmol/L (ref 20–32)
Calcium: 9.5 mg/dL (ref 8.6–10.3)
Chloride: 108 mmol/L (ref 98–110)
Creat: 0.96 mg/dL (ref 0.70–1.33)
GFR, Est African American: 100 mL/min/{1.73_m2} (ref >=60)
GFR, Est Non African American: 86 mL/min/{1.73_m2} (ref >=60)
Globulin: 2.3 g/dL (calc) (ref 1.9–3.7)
Glucose, Bld: 103 mg/dL — ABNORMAL HIGH (ref 65–99)
Potassium: 4.2 mmol/L (ref 3.5–5.3)
Sodium: 142 mmol/L (ref 135–146)
Total Bilirubin: 1.1 mg/dL (ref 0.2–1.2)
Total Protein: 6.7 g/dL (ref 6.1–8.1)

## 2020-10-12 LAB — LIPID PANEL
Cholesterol: 155 mg/dL (ref <200)
HDL: 48 mg/dL (ref >=40)
LDL Cholesterol (Calc): 94 mg/dL (calc)
Non-HDL Cholesterol (Calc): 107 mg/dL (calc) (ref <130)
Total CHOL/HDL Ratio: 3.2 (calc) (ref <5.0)
Triglycerides: 43 mg/dL (ref <150)

## 2020-11-08 ENCOUNTER — Other Ambulatory Visit: Payer: Self-pay | Admitting: Adult Health

## 2020-11-08 ENCOUNTER — Other Ambulatory Visit: Payer: Self-pay | Admitting: *Deleted

## 2020-11-08 ENCOUNTER — Other Ambulatory Visit: Payer: Self-pay | Admitting: Internal Medicine

## 2020-11-08 DIAGNOSIS — I10 Essential (primary) hypertension: Secondary | ICD-10-CM

## 2020-11-08 DIAGNOSIS — I493 Ventricular premature depolarization: Secondary | ICD-10-CM

## 2020-11-08 DIAGNOSIS — E785 Hyperlipidemia, unspecified: Secondary | ICD-10-CM

## 2020-11-08 DIAGNOSIS — R002 Palpitations: Secondary | ICD-10-CM

## 2020-11-08 MED ORDER — ROSUVASTATIN CALCIUM 5 MG PO TABS: ORAL_TABLET | ORAL | 2 refills | Status: DC

## 2020-11-08 MED ORDER — METOPROLOL TARTRATE 25 MG PO TABS
ORAL_TABLET | ORAL | 2 refills | Status: DC
Start: 2020-11-08 — End: 2021-02-26

## 2020-11-08 MED ORDER — VERAPAMIL HCL ER 120 MG PO TBCR
EXTENDED_RELEASE_TABLET | ORAL | 2 refills | Status: DC
Start: 2020-11-08 — End: 2020-12-11

## 2020-11-13 ENCOUNTER — Ambulatory Visit: Payer: 59 | Admitting: Cardiovascular Disease

## 2020-11-14 ENCOUNTER — Ambulatory Visit: Payer: 59 | Admitting: *Deleted

## 2020-11-14 ENCOUNTER — Other Ambulatory Visit: Payer: Self-pay

## 2020-11-14 VITALS — BP 116/84 | HR 68 | Temp 97.4°F | Resp 16 | Wt 273.8 lb

## 2020-11-14 DIAGNOSIS — Z23 Encounter for immunization: Secondary | ICD-10-CM

## 2020-11-14 DIAGNOSIS — S81811A Laceration without foreign body, right lower leg, initial encounter: Secondary | ICD-10-CM | POA: Diagnosis not present

## 2020-11-14 NOTE — Progress Notes (Signed)
Patient here for a NV to receive a tetanus injection due to a laceration to his right calf, while doing yard work this morning. Td given in left deltoid IM.

## 2020-12-10 ENCOUNTER — Encounter: Payer: Self-pay | Admitting: Cardiovascular Disease

## 2020-12-10 NOTE — Progress Notes (Signed)
Cardiology Office Note    Date:  12/11/2020  ID:  David Fuller, DOB Aug 06, 1961, MRN [id number] PCP:  [name], MD  Cardiologist:  New, Dr. Acie Fredrickson    Chief Complaint: palpitations       Previous notes per [name], PA   David Fuller  is a 60 y.o. male with history of asthma, HTN, HLD, obesity, suspected OSA, seasonal allergies, diverticulitis who is being seen today for the evaluation of palpitations at the request of Dr. Rogene Houston (EDP).  He has no prior cardiac history. He was previously told he had suspected sleep apnea due to an overnight pulse ox test in the past but never had a formal sleep study due to some technical issues with scheduling with pulmonology. In June of this year he noticed sensation of palpitations following a temporary increase in caffeine intake (6 drinks per day). He quit caffeine cold [place] and these resolved. In early September he noticed about 3 days of recurrent palpitations described as skips/flips that felt like a fullness in his throat. This began shortly after he had worked outside in the extreme heat - he thinks he had lost a lot of fluid in sweat. Due to persistent symptoms he was seen in the ED 07/19/17 where he was found to have rare PVCs. Troponin was negative. CBC wnl, BMET showed K 4.7, Cr 0.93, glucose 116. Remote MG/TSH 01/2017 wnl and LDL 146. He was advised to establish care with cardiology. He continues to have occasional palpitations most days of the week. He does not believe he's had any recent tachypalpitations. He does snore. He's been actively trying to lose weight -30lb over the last year. He wants to be healthier to be around for his 2.5-yea-old grandson. He also walks daily and rides his bike every other day. He's not had any recent chest pain, dyspnea, LEE, syncope. Palpitations improve with physical exercise.  Nov. 6, 2018:  Doing better  Still having palpitations Worse with caffiene. Is under lots of stress .    Echocardiogram from October 5 shows normal left ventricular systolic function with EF 65-70%.  He has grade 1 diastolic dysfunction.  There is very minimal ascending aortic dilatation.  Normal pulmonary artery pressures. Tries to watch his salt .   Occasional hot dog.  Has lost 32 lbs over the past year .   Walks , rides his bike   Nov. 6, 2019:  Doing well Had some PVCs Tried verapamil in place of the metoprolol  Checked his electrolytes.  OSA is well controlled.  Wt was 256 lbs   Mood seems to be OK on Celexa 20 mg a day  Exercising regularly .   Joined [employer] - goes 4 times a week  No significant arrhythmias  After workouts  Has been eating salads with chicken for the past several months   Dec. 9, 2020    Will be seen today for follow-up of his premature ventricular contractions and hypertension. Wt is 276 lbs ( (+ 20 lbs from last year)  Is walking 4 miles a day .  Admits to eating too much  Palpitations are well controlled  Feb. 1, 2022  David Fuller is seen today for follow up of his PVCs and HTN Wt today is  274 lbs  Has OSA - sees Dr. Radford Pax  Has some lightheadedness after he takes his morning metoprolol and Calan SR 120 mg  His primary MD gave his the Calan for PVCs( the metoprolol was not controlling )  Trying to walk 10,000 steps a day  Is starting to work    Past Medical History:  Diagnosis Date  . Allergy   . Asthma   . Diverticulitis   . Diverticulitis 09/17/2015  . Essential hypertension   . Hemorrhoid   . Hyperlipidemia   . Kidney stones   . Mild dilation of ascending aorta (HCC)   . Obesity   . Obstructive sleep apnea 10/16/2010   Qualifier: Diagnosis of  By: Gwenette Greet MD, Armando Reichert  Not on CPAP    . 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   . Seasonal allergies 08/24/2013  . Swollen lymph nodes    abd, groin,     Past Surgical History:  Procedure  Laterality Date  . VASECTOMY  MID 90S    Current Medications: Current Meds  Medication Sig  . Ascorbic Acid (VITAMIN C) 100 MG tablet Take 100 mg by mouth daily.  . Cholecalciferol (VITAMIN D3 PO) Take 6,000 Units by mouth daily.   . fluticasone (FLONASE) 50 MCG/ACT nasal spray Place 2 sprays into both nostrils as needed for allergies.  . Magnesium 400 MG CAPS Take 1 tablet by mouth daily.  . metoprolol tartrate (LOPRESSOR) 25 MG tablet TAKE 1/2 TABLET TWICE DAILY (EVERY 12 HOURS) FOR BLOOD PRESSURE & HEART RHYTHM  . rosuvastatin (CRESTOR) 5 MG tablet Take      1 tablet      Daily       for Cholesterol  . Thiamine HCl (VITAMIN B-1) 250 MG tablet Take 250 mg by mouth daily.  . verapamil (CALAN-SR) 120 MG CR tablet TAKE 1 TABLET DAILY WITH FOOD FOR BLOOD PRESSURE AND HEART RHYTHM  . Zinc 100 MG TABS Take by mouth.     Allergies:   Shellfish allergy and Iodine   Social History   Socioeconomic History  . Marital status: Married    Spouse name: Not on file  . Number of children: Not on file  . Years of education: Not on file  . Highest education level: Not on file  Occupational History  . Not on file  Tobacco Use  . Smoking status: Never Smoker  . Smokeless tobacco: Former Systems developer    Types: Secondary school teacher  . Vaping Use: Never used  Substance and Sexual Activity  . Alcohol use: Yes    Alcohol/week: 1.0 - 2.0 standard drink    Types: 1 - 2 Cans of beer per week    Comment: 2OZ A WEEK  . Drug use: No  . Sexual activity: Yes  Other Topics Concern  . Not on file  Social History Narrative  . Not on file   Social Determinants of Health   Financial Resource Strain: Not on file  Food Insecurity: Not on file  Transportation Needs: Not on file  Physical Activity: Not on file  Stress: Not on file  Social Connections: Not on file     Family History:  Family History  Problem Relation Age of Onset  . Diabetes Father   . Heart disease Father   . Heart failure Father   .  Cancer Brother        Melanoma- left arm  . Atrial fibrillation Mother   . Stroke Mother     ROS:   Please see the history of present illness.  All other systems are reviewed and otherwise negative.   Physical Exam: Blood pressure 120/80, pulse  70, height 6' (1.829 m), weight 274 lb (124.3 kg), SpO2 96 %.  GEN:  Well nourished, well developed in no acute distress HEENT: Normal NECK: No JVD; No carotid bruits LYMPHATICS: No lymphadenopathy CARDIAC: RRR , no murmurs, rubs, gallops RESPIRATORY:  Clear to auscultation without rales, wheezing or rhonchi  ABDOMEN: Soft, non-tender, non-distended MUSCULOSKELETAL:  No edema; No deformity  SKIN: Warm and dry NEUROLOGIC:  Alert and oriented x 3     Wt Readings from Last 3 Encounters:  12/11/20 274 lb (124.3 kg)  11/14/20 273 lb 12.8 oz (124.2 kg)  10/11/20 267 lb (121.1 kg)      Studies/Labs Reviewed:   EKG:   Recent Labs: 02/20/2020: Magnesium 2.1; TSH 2.09 10/11/2020: ALT 31; BUN 15; Creat 0.96; Hemoglobin 15.9; Platelets 269; Potassium 4.2; Sodium 142   Lipid Panel    Component Value Date/Time   CHOL 155 10/11/2020 1029   CHOL 165 12/16/2018 0931   TRIG 43 10/11/2020 1029   HDL 48 10/11/2020 1029   HDL 50 12/16/2018 0931   CHOLHDL 3.2 10/11/2020 1029   VLDL 18 01/13/2017 1445   LDLCALC 94 10/11/2020 1029    Additional studies/ records that were reviewed today include: Summarized above.   ASSESSMENT & PLAN:    1.  Palpitations -  Doing well  He has some several hours has several hours of orthostatic hypotension after he takes his K land and metoprolol in the morning.  We will have him decrease the Calan  to one half a tablet a day.  He thinks he may end up stopping the Calan  which I think would be fine.  2.  Essential HTN -   BP is well controlled.   3.  Hyperlipidemia -   Managed by his primary md   4.   OSA-  Sees Dr. Radford Pax.    Will see him in  1 year     Medication Adjustments/Labs and Tests  Ordered: Current medicines are reviewed at length with the patient today.  Concerns regarding medicines are outlined above. Medication changes, Labs and Tests ordered today are summarized above and listed in the Patient Instructions accessible in Encounters.   Signed, Mertie Moores, MD  12/11/2020 10:15 AM    [facility] [address], [place], [place]  [address] Phone: [phone]; Fax: [phone]

## 2020-12-11 ENCOUNTER — Other Ambulatory Visit: Payer: Self-pay

## 2020-12-11 ENCOUNTER — Encounter: Payer: Self-pay | Admitting: Cardiovascular Disease

## 2020-12-11 ENCOUNTER — Ambulatory Visit: Payer: 59 | Admitting: Cardiovascular Disease

## 2020-12-11 VITALS — BP 120/80 | HR 70 | Ht 72.0 in | Wt 274.0 lb

## 2020-12-11 DIAGNOSIS — E785 Hyperlipidemia, unspecified: Secondary | ICD-10-CM

## 2020-12-11 DIAGNOSIS — I10 Essential (primary) hypertension: Secondary | ICD-10-CM | POA: Diagnosis not present

## 2020-12-11 DIAGNOSIS — R002 Palpitations: Secondary | ICD-10-CM | POA: Diagnosis not present

## 2020-12-11 MED ORDER — VERAPAMIL HCL ER 120 MG PO TBCR: 60.0000 mg | EXTENDED_RELEASE_TABLET | Freq: Every day | ORAL | 2 refills | Status: DC

## 2020-12-11 NOTE — Patient Instructions (Signed)
Medication Instructions:  Your physician has recommended you make the following change in your medication:   Decrease Verapamil to 60mg  (half a tablet) daily.  *If you need a refill on your cardiac medications before your next appointment, please call your pharmacy*   Lab Work: none    Testing/Procedures: none   Follow-Up: At [facility], you and your health needs are our priority.  As part of our continuing mission to provide you with exceptional heart care, we have created designated Provider Care Teams.  These Care Teams include your primary Cardiologist (physician) and Advanced Practice Providers (APPs -  Physician Assistants and Nurse Practitioners) who all work together to provide you with the care you need, when you need it.   Your next appointment:   1 year(s)  The format for your next appointment:   In Person  Provider:   You may see Mertie Moores, MD or one of the following Advanced Practice Providers on your designated Care Team:    Richardson Dopp, PA-C  [place], [place]

## 2021-02-25 ENCOUNTER — Encounter: Payer: 59 | Admitting: Adult Health Nurse Practitioner

## 2021-02-26 ENCOUNTER — Other Ambulatory Visit: Payer: Self-pay | Admitting: Internal Medicine

## 2021-02-26 DIAGNOSIS — E785 Hyperlipidemia, unspecified: Secondary | ICD-10-CM

## 2021-02-26 DIAGNOSIS — I493 Ventricular premature depolarization: Secondary | ICD-10-CM

## 2021-02-26 DIAGNOSIS — I10 Essential (primary) hypertension: Secondary | ICD-10-CM

## 2021-03-27 DIAGNOSIS — E559 Vitamin D deficiency, unspecified: Secondary | ICD-10-CM | POA: Insufficient documentation

## 2021-03-27 NOTE — Progress Notes (Deleted)
Complete Physical  Assessment and Plan:   Encounter for general adult medical examination with abnormal findings Due annually    Essential hypertension - continue medications, DASH diet, exercise and monitor at home. Call if greater than 130/80.  - CBC with Differential/Platelet - CMP/GFR - TSH - Magnesium  - UA/microalbumin - EKG 12-Lead  Morbid Obesity - BMI 30+ with OSA Long discussion about weight loss, diet, and exercise Discussed ideal weight for height (below ***) and initial weight goal (***) Patient will work on *** Will follow up in ***   History of nephrolithiasis Increase fluids   Diverticulitis of intestine without perforation or abscess without bleeding Continue fiber  Abnormal glucose Discussed general issues about diabetes pathophysiology and management., Educational material distributed., Suggested low cholesterol diet., Encouraged aerobic exercise., Discussed foot care., Reminded to get yearly retinal exam. - Insulin, fasting - A1C  Asthma, unspecified asthma severity, uncomplicated Better with singulair, albuterol, but has cleaned out humidifer from house and doing better.   Hyperlipidemia -continue medications, check lipids, decrease fatty foods, increase activity.  - Lipid panel - TSH  Obstructive sleep apnea Weight loss advised Continue BiPAP  Seasonal allergies Continue medications   Medication management - Magnesium  Vitamin D deficiency - Vit D  25 hydroxy (rtn osteoporosis monitoring)  Screening for prostate cancer - PSA  . Discussed med's effects and SE's. Screening labs and tests as requested with regular follow-up as recommended. Future Appointments  Date Time Provider [place]  03/28/2021  9:00 AM Liane Comber, NP [facility] None  03/28/2022  9:00 AM Liane Comber, NP [facility] None    HPI 60 y.o. male presents for a complete physical. He has Obstructive sleep apnea; Hyperlipidemia; Seasonal allergies;  Asthma; Essential hypertension; Morbid obesity ([place]); History of nephrolithiasis; PVC's (premature ventricular contractions); Anorectal fissure; and Diverticulosis on their problem list.  He is a retired EMT 2018, does consulting now, married with 1 daughter who is a Audiological scientist, has grandson 63 yr old.    He has asthma/allergies and is on allegra, and has albuterol which helps.   Hx of diverticulosis, anal fissure ***   BMI is There is no height or weight on file to calculate BMI., he is working on diet and exercise.   Wt Readings from Last 3 Encounters:  12/11/20 274 lb (124.3 kg)  11/14/20 273 lb 12.8 oz (124.2 kg)  10/11/20 267 lb (121.1 kg)   Has sleep apnea, suppose to be on biPAP optimal pressure 17/13, wears nightly, has been on it for 2 years, wears nasal pillows. States it helps with daytime energy and feels much better.   He follows with Dr. Acie Fredrickson for palpitations in cardiology. Echo Oct 2020 showed normal EF 65-70%.  He has grade 1 diastolic dysfunction.  There is very minimal ascending aortic dilatation.  Normal pulmonary artery pressures.    His blood pressure has been controlled at home, today their BP is  He does workout, walks 3 miles 6 days a week and bikes 12 miles 4 nights a week. He denies chest pain, shortness of breath, dizziness.   He is on cholesterol medication, crestor 5mg  and denies myalgias. His cholesterol is at goal. The cholesterol last visit was:   Lab Results  Component Value Date   CHOL 155 10/11/2020   HDL 48 10/11/2020   LDLCALC 94 10/11/2020   TRIG 43 10/11/2020   CHOLHDL 3.2 10/11/2020   Last A1C in the office was:  Lab Results  Component Value Date   HGBA1C 5.1  09/14/2018   Patient is on Vitamin D supplement- has been on treatment.   Lab Results  Component Value Date   VD25OH 67 02/20/2020     Last PSA was: Lab Results  Component Value Date   PSA 1.0 02/20/2020     Lab Results  Component Value Date   IRON 162 01/14/2018   TIBC  343 01/14/2018   FERRITIN 306 01/13/2017   Lab Results  Component Value Date   VITAMINB12 1,719 (H) 07/25/2019     Current Medications:    Current Outpatient Medications (Cardiovascular):  .  metoprolol tartrate (LOPRESSOR) 25 MG tablet, TAKE 1/2 TABLET TWICE DAILY (EVERY 12 HOURS) FOR BLOOD PRESSURE & HEART RHYTHM .  rosuvastatin (CRESTOR) 5 MG tablet, TAKE 1 TABLET ONCE A DAY TO CONTROL CHOLESTEROL. [name] Kitchen  verapamil (CALAN-SR) 120 MG CR tablet, Take 0.5 tablets (60 mg total) by mouth daily.  Current Outpatient Medications (Respiratory):  .  fluticasone (FLONASE) 50 MCG/ACT nasal spray, Place 2 sprays into both nostrils as needed for allergies.    Current Outpatient Medications (Other):  [name] Kitchen  Ascorbic Acid (VITAMIN C) 100 MG tablet, Take 100 mg by mouth daily. .  Cholecalciferol (VITAMIN D3 PO), Take 6,000 Units by mouth daily.  .  Magnesium 400 MG CAPS, Take 1 tablet by mouth daily. .  Thiamine HCl (VITAMIN B-1) 250 MG tablet, Take 250 mg by mouth daily. .  Zinc 100 MG TABS, Take by mouth.  Health Maintenance:  Immunization History  Administered Date(s) Administered  . DTaP 08/25/2011  . Influenza Inj Mdck Quad With Preservative 08/19/2017, 09/14/2018  . Influenza Whole 08/29/2013  . Moderna Sars-Covid-2 Vaccination 11/09/2019, 12/07/2019  . Td 11/14/2020   Tetanus: 11/2020 Pneumovax: N/A Prevnar 13: N/A Flu vaccine:2020 Shingrix: *** Covid 19: 2/2, moderna  Colonoscopy: 08/2016 due 10 years EGD: N/A  CXR 2016  Last eye:  Last dental:  Last derm:   Patient Care Team: [name], MD as PCP - General (Internal Medicine) Nahser, Wonda Cheng, MD as PCP - Cardiology (Cardiology) Sueanne Margarita, MD as PCP - Sleep Medicine (Cardiology) Druscilla Brownie, MD as Consulting Physician (Dermatology) Richmond Campbell, MD as Consulting Physician (Gastroenterology) Monna Fam, MD as Consulting Physician (Ophthalmology)   Medical History:  Past Medical History:   Diagnosis Date  . Allergy   . Asthma   . Diverticulitis   . Diverticulitis 09/17/2015  . Essential hypertension   . Hemorrhoid   . Hyperlipidemia   . Kidney stones   . Mild dilation of ascending aorta (HCC)   . Obesity   . Obstructive sleep apnea 10/16/2010   Qualifier: Diagnosis of  By: Gwenette Greet MD, Armando Reichert  Not on CPAP    . 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   . Seasonal allergies 08/24/2013  . Swollen lymph nodes    abd, groin,    Allergies Allergies  Allergen Reactions  . Shellfish Allergy Nausea Only  . Iodine Other (See Comments)    Unknown Unknown Allergic to shellfish - nausea    SURGICAL HISTORY He  has a past surgical history that includes Vasectomy (MID 90S). FAMILY HISTORY His family history includes Atrial fibrillation in his mother; Cancer in his brother; Diabetes in his father; Heart disease in his father; Heart failure in his father; Stroke in his mother. SOCIAL HISTORY He  reports that he has never smoked. He has quit using smokeless tobacco.  His smokeless tobacco  use included chew. He reports current alcohol use of about 1.0 - 2.0 standard drink of alcohol per week. He reports that he does not use drugs.  Review of Systems  Constitutional: Negative.  Negative for malaise/fatigue and weight loss.  HENT: Negative.  Negative for hearing loss and tinnitus.   Eyes: Negative.  Negative for blurred vision and double vision.  Respiratory: Negative.  Negative for cough, shortness of breath and wheezing.   Cardiovascular: Negative.  Negative for chest pain, palpitations, orthopnea, claudication and leg swelling.  Gastrointestinal: Negative.  Negative for abdominal pain, blood in stool, constipation, diarrhea, heartburn, melena, nausea and vomiting.  Genitourinary: Negative.   Musculoskeletal: Negative.  Negative for joint pain and myalgias.  Skin: Negative.   Negative for rash.  Neurological: Negative.  Negative for dizziness, tingling, sensory change, weakness and headaches.  Endo/Heme/Allergies: Negative for polydipsia.  Psychiatric/Behavioral: Negative.   All other systems reviewed and are negative.   Physical Exam: Estimated body mass index is 37.16 kg/m as calculated from the following:   Height as of 12/11/20: 6' (1.829 m).   Weight as of 12/11/20: 274 lb (124.3 kg). There were no vitals taken for this visit. General Appearance: Well nourished, in no apparent distress.  Eyes: PERRLA, EOMs, conjunctiva no swelling or erythema, normal fundi and vessels.  Sinuses: No Frontal/maxillary tenderness  ENT/Mouth: Ext aud canals clear, normal light reflex with TMs without erythema, bulging. Good dentition. No erythema, swelling, or exudate on post pharynx. Tonsils not swollen or erythematous. Hearing normal.  Neck: Supple, thyroid normal. No bruits  Respiratory: Respiratory effort normal, BS equal bilaterally without rales, rhonchi, wheezing or stridor.  Cardio: RRR without murmurs, rubs or gallops. Brisk peripheral pulses without edema.  Chest: symmetric, with normal excursions and percussion.  Abdomen: Soft, nontender, obese,  no guarding, rebound, hernias, masses, or organomegaly.  Lymphatics: Non tender without lymphadenopathy.  Genitourinary: defer *** Musculoskeletal: Full ROM all peripheral extremities,5/5 strength, and normal gait.  Skin: Warm, dry without rashes, lesions, ecchymosis. Neuro: Cranial nerves intact, reflexes equal bilaterally. Normal muscle tone, no cerebellar symptoms. Sensation intact.  Psych: Awake and oriented X 3, normal affect, Insight and Judgment appropriate.   EKG: declines, follows cardiology - recently had in 12/2020  David Fuller 8:35 AM [facility]

## 2021-03-28 ENCOUNTER — Encounter: Payer: 59 | Admitting: Adult Health

## 2021-03-28 DIAGNOSIS — J302 Other seasonal allergic rhinitis: Secondary | ICD-10-CM

## 2021-03-28 DIAGNOSIS — K5790 Diverticulosis of intestine, part unspecified, without perforation or abscess without bleeding: Secondary | ICD-10-CM

## 2021-03-28 DIAGNOSIS — I493 Ventricular premature depolarization: Secondary | ICD-10-CM

## 2021-03-28 DIAGNOSIS — Z1389 Encounter for screening for other disorder: Secondary | ICD-10-CM

## 2021-03-28 DIAGNOSIS — K602 Anal fissure, unspecified: Secondary | ICD-10-CM

## 2021-03-28 DIAGNOSIS — I10 Essential (primary) hypertension: Secondary | ICD-10-CM

## 2021-03-28 DIAGNOSIS — Z0000 Encounter for general adult medical examination without abnormal findings: Secondary | ICD-10-CM

## 2021-03-28 DIAGNOSIS — Z125 Encounter for screening for malignant neoplasm of prostate: Secondary | ICD-10-CM

## 2021-03-28 DIAGNOSIS — Z1329 Encounter for screening for other suspected endocrine disorder: Secondary | ICD-10-CM

## 2021-03-28 DIAGNOSIS — E785 Hyperlipidemia, unspecified: Secondary | ICD-10-CM

## 2021-03-28 DIAGNOSIS — J45909 Unspecified asthma, uncomplicated: Secondary | ICD-10-CM

## 2021-03-28 DIAGNOSIS — Z87442 Personal history of urinary calculi: Secondary | ICD-10-CM

## 2021-03-28 DIAGNOSIS — E559 Vitamin D deficiency, unspecified: Secondary | ICD-10-CM

## 2021-03-28 DIAGNOSIS — G4733 Obstructive sleep apnea (adult) (pediatric): Secondary | ICD-10-CM

## 2021-03-28 DIAGNOSIS — Z131 Encounter for screening for diabetes mellitus: Secondary | ICD-10-CM

## 2021-04-01 ENCOUNTER — Other Ambulatory Visit: Payer: Self-pay

## 2021-04-01 DIAGNOSIS — I493 Ventricular premature depolarization: Secondary | ICD-10-CM

## 2021-04-01 DIAGNOSIS — I10 Essential (primary) hypertension: Secondary | ICD-10-CM

## 2021-04-01 DIAGNOSIS — E785 Hyperlipidemia, unspecified: Secondary | ICD-10-CM

## 2021-04-01 MED ORDER — METOPROLOL TARTRATE 25 MG PO TABS: ORAL_TABLET | ORAL | 0 refills | Status: DC

## 2021-04-01 MED ORDER — ROSUVASTATIN CALCIUM 5 MG PO TABS
ORAL_TABLET | ORAL | 0 refills | Status: DC
Start: 2021-04-01 — End: 2021-07-18

## 2021-04-15 ENCOUNTER — Encounter (HOSPITAL_COMMUNITY): Payer: Self-pay

## 2021-04-15 ENCOUNTER — Ambulatory Visit (HOSPITAL_COMMUNITY)
Admission: EM | Admit: 2021-04-15 | Discharge: 2021-04-15 | Disposition: A | Discharge status: Home / Self Care | Payer: 59 | Attending: Emergency Medicine | Admitting: Emergency Medicine

## 2021-04-15 DIAGNOSIS — H60393 Other infective otitis externa, bilateral: Secondary | ICD-10-CM

## 2021-04-15 MED ORDER — OFLOXACIN 0.3 % OT SOLN: 10.0000 [drp] | Freq: Every day | OTIC | 0 refills | Status: AC

## 2021-04-15 NOTE — Discharge Instructions (Signed)
Instill 10 drops into each ear daily for the next 7 days.    Do not go swimming while undergoing treatment.  Try to keep your ears dry and do not allow water to sit in ears.  Return or go to the Emergency Department if symptoms worsen or do not improve in the next few days.

## 2021-04-15 NOTE — ED Provider Notes (Signed)
David Fuller    CSN: [id number] Arrival date & time: 04/15/21  0802      History   Chief Complaint Chief Complaint  Patient presents with  . Ear Pain    HPI David Fuller is a 60 y.o. male.   Patient here for evaluation of bilateral ear pain that has been ongoing for the past few days.  Reports feeling like ears are clogged.  Reports swimming daily last 9 days.  Denies any drainage or discharge.  Has not tried any OTC medications or treatments.  Denies any trauma, injury, or other precipitating event.  Denies any specific alleviating or aggravating factors.  Denies any fevers, chest pain, shortness of breath, N/V/D, numbness, tingling, weakness, abdominal pain, or headaches.    The history is provided by the patient.    Past Medical History:  Diagnosis Date  . Allergy   . Asthma   . Diverticulitis   . Diverticulitis 09/17/2015  . Essential hypertension   . Hemorrhoid   . Hyperlipidemia   . Kidney stones   . Mild dilation of ascending aorta (HCC)   . Obesity   . Obstructive sleep apnea 10/16/2010   Qualifier: Diagnosis of  By: Gwenette Greet MD, Armando Reichert  Not on CPAP    . 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   . Seasonal allergies 08/24/2013  . Swollen lymph nodes    abd, groin,     Patient Active Problem List   Diagnosis Date Noted  . Vitamin D deficiency 03/27/2021  . Anorectal fissure 12/25/2017  . PVC's (premature ventricular contractions) 09/15/2017  . Diverticulosis 05/20/2017  . Essential hypertension 09/17/2015  . Morbid obesity ([place]) 09/17/2015  . History of nephrolithiasis 09/17/2015  . Hyperlipidemia 08/24/2013  . Seasonal allergies 08/24/2013  . Asthma 08/24/2013  . Obstructive sleep apnea 10/16/2010    Past Surgical History:  Procedure Laterality Date  . VASECTOMY  MID 90S       Home Medications    Prior to Admission medications    Medication Sig Start Date End Date Taking? Authorizing Provider  ofloxacin (FLOXIN) 0.3 % OTIC solution Place 10 drops into both ears daily for 7 days. 04/15/21 04/22/21 Yes Pearson Forster, NP  Ascorbic Acid (VITAMIN C) 100 MG tablet Take 100 mg by mouth daily.    [provider]  Cholecalciferol (VITAMIN D3 PO) Take 6,000 Units by mouth daily.     [provider]  fluticasone (FLONASE) 50 MCG/ACT nasal spray Place 2 sprays into both nostrils as needed for allergies. 06/17/16   Rolene Course, PA-C  Magnesium 400 MG CAPS Take 1 tablet by mouth daily.    [provider]  metoprolol tartrate (LOPRESSOR) 25 MG tablet Take 1/2 tablet twice daily (every 12 hours) for blood pressure and heart rhythm. 04/01/21   Liane Comber, NP  rosuvastatin (CRESTOR) 5 MG tablet TAKE 1 TABLET ONCE A DAY TO CONTROL CHOLESTEROL. 04/01/21   Liane Comber, NP  Thiamine HCl (VITAMIN B-1) 250 MG tablet Take 250 mg by mouth daily.    [provider]  verapamil (CALAN-SR) 120 MG CR tablet Take 0.5 tablets (60 mg total) by mouth daily. 12/11/20   Nahser, Wonda Cheng, MD  Zinc 100 MG TABS Take by mouth.    [provider]    Family History Family History  Problem Relation Age of Onset  . Diabetes Father   . Heart disease Father   .  Heart failure Father   . Cancer Brother        Melanoma- left arm  . Atrial fibrillation Mother   . Stroke Mother     Social History Social History   Tobacco Use  . Smoking status: Never Smoker  . Smokeless tobacco: Former Systems developer    Types: Secondary school teacher  . Vaping Use: Never used  Substance Use Topics  . Alcohol use: Yes    Alcohol/week: 1.0 - 2.0 standard drink    Types: 1 - 2 Cans of beer per week    Comment: 2OZ A WEEK  . Drug use: No     Allergies   Shellfish allergy and Iodine   Review of Systems Review of Systems  HENT: Positive for ear pain.      Physical Exam Triage Vital Signs ED Triage Vitals  Enc Vitals Group      BP 04/15/21 0818 127/85     Pulse Rate 04/15/21 0818 80     Resp 04/15/21 0818 18     Temp 04/15/21 0818 98 F (36.7 C)     Temp Source 04/15/21 0818 Oral     SpO2 04/15/21 0818 97 %     Weight --      Height --      Head Circumference --      Peak Flow --      Pain Score 04/15/21 0816 3     Pain Loc --      Pain Edu? --      Excl. in [place]? --    No data found.  Updated Vital Signs BP 127/85 (BP Location: Right Arm)   Pulse 80   Temp 98 F (36.7 C) (Oral)   Resp 18   SpO2 97%   Visual Acuity Right Eye Distance:   Left Eye Distance:   Bilateral Distance:    Right Eye Near:   Left Eye Near:    Bilateral Near:     Physical Exam Vitals and nursing note reviewed.  Constitutional:      General: He is not in acute distress.    Appearance: Normal appearance. He is not ill-appearing, toxic-appearing or diaphoretic.  HENT:     Head: Normocephalic and atraumatic.     Right Ear: Swelling and tenderness present.     Left Ear: Swelling and tenderness present.  Eyes:     Conjunctiva/sclera: Conjunctivae normal.  Cardiovascular:     Rate and Rhythm: Normal rate.     Pulses: Normal pulses.  Pulmonary:     Effort: Pulmonary effort is normal.  Abdominal:     General: Abdomen is flat.  Musculoskeletal:        General: Normal range of motion.     Cervical back: Normal range of motion.  Skin:    General: Skin is warm and dry.  Neurological:     General: No focal deficit present.     Mental Status: He is alert and oriented to person, place, and time.  Psychiatric:        Mood and Affect: Mood normal.      UC Treatments / Results  Labs (all labs ordered are listed, but only abnormal results are displayed) Labs Reviewed - No data to display  EKG   Radiology No results found.  Procedures Procedures (including critical care time)  Medications Ordered in UC Medications - No data to display  Initial Impression / Assessment and Plan / UC Course  I have reviewed  the triage  vital signs and the nursing notes.  Pertinent labs & imaging results that were available during my care of the patient were reviewed by me and considered in my medical decision making (see chart for details).     Assessment negative for red flags or concerns.  Bilateral otitis externa.  Will treat with ofloxacin daily for the next 7.  Encourage patient to keep ears dry while undergoing treatment.  Follow-up with primary care as needed.  Final Clinical Impressions(s) / UC Diagnoses   Final diagnoses:  Infective otitis externa of both ears     Discharge Instructions     Instill 10 drops into each ear daily for the next 7 days.    Do not go swimming while undergoing treatment.  Try to keep your ears dry and do not allow water to sit in ears.  Return or go to the Emergency Department if symptoms worsen or do not improve in the next few days.      ED Prescriptions    Medication Sig Dispense Auth. Provider   ofloxacin (FLOXIN) 0.3 % OTIC solution Place 10 drops into both ears daily for 7 days. 3.5 mL Pearson Forster, NP     PDMP not reviewed this encounter.   Pearson Forster, NP 04/15/21 [phone]

## 2021-04-15 NOTE — ED Triage Notes (Signed)
Pt states he has swimmers ear on bother ears. Pt states he was in the pool every day for the past 9 days and states he feels his ears are clogged. Pt states the left ear is the worst.

## 2021-07-17 NOTE — Progress Notes (Signed)
Complete Physical  Assessment and Plan:   Encounter for Annual Physical Exam with abnormal findings Due annually  Health Maintenance reviewed Healthy lifestyle reviewed and goals set  Asthma, unspecified asthma severity, uncomplicated Better with singulair, albuterol, home humidifier  Hyperlipidemia -continue medications, LDL goal <100, check lipids, decrease fatty foods, increase activity.   Obstructive sleep apnea Weight loss advised Continue BiPAP  Seasonal allergies Continue medications   Essential hypertension - continue medications, DASH diet, exercise and monitor at home. Call if greater than 130/80.   Morbid Obesity - BMI 30+ with OSA Obesity with co morbidities- long discussion about weight loss, diet, and exercise   History of nephrolithiasis Increase fluids   Diverticulitis of intestine without perforation or abscess without bleeding Continue fiber  Abnormal glucose Discussed general issues about diabetes pathophysiology and management., Educational material distributed., Suggested low cholesterol diet., Encouraged aerobic exercise., Discussed foot care., Reminded to get yearly retinal exam.  Vitamin D deficiency Continue supplement, check levels annually and as needed  Screening for prostate cancer - PSA   Orders Placed This Encounter  Procedures   CBC with Differential/Platelet   COMPLETE METABOLIC PANEL WITH GFR   Magnesium   Lipid panel   TSH   Hemoglobin A1c   VITAMIN D 25 Hydroxy (Vit-D Deficiency, Fractures)   PSA   Vitamin B12   Microalbumin / creatinine urine ratio   Urinalysis, Routine w reflex microscopic    . Discussed med's effects and SE's. Screening labs and tests as requested with regular follow-up as recommended. Future Appointments  Date Time Provider [place]  07/29/2021  9:40 AM Sueanne Margarita, MD [facility]  07/18/2022 10:00 AM Liane Comber, NP [facility] None    HPI 60 y.o. male patient  presents for a complete physical. He has Obstructive sleep apnea; Hyperlipidemia; Seasonal allergies; Asthma; Essential hypertension; Morbid obesity ([place]) - BMI 30+ with OSA; History of nephrolithiasis; PVC's (premature ventricular contractions); Anorectal fissure; Diverticulosis; and Vitamin D deficiency on their problem list.  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   No concerns today.   He has asthma/allergies and is on allegra as needed, and has albuterol but rarely needs with bronchitis.   BMI is Body mass index is 37.08 kg/m., he is working on diet and exercise. He bikes 12 miles 3 nights a week, walks 3 miles every other day. Also active in yard.  He is doing noom, lost 25 lb with this last year, wife/family are picky eaters. He is cutting out sweet drinks, alcohol, ice cream. Tries to make good choices, limits red meat. Knows what he needs to do.  Has sleep apnea, on biPAP optimal pressure 17/13, wears nightly, wears nasal pillows. States it helps with daytime energy and feels much better.   Wt Readings from Last 3 Encounters:  07/18/21 273 lb 6.4 oz (124 kg)  12/11/20 274 lb (124.3 kg)  11/14/20 273 lb 12.8 oz (124.2 kg)   He follows with Dr. Acie Fredrickson for palpitations/PVCs. Echo Oct 2020 showed normal EF 65-70%.  He has grade 1 diastolic dysfunction.  There is very minimal ascending aortic dilatation.  Normal pulmonary artery pressures.    His blood pressure has been controlled at home, today their BP isBP: 132/76 He does workout. He denies chest pain, shortness of breath, dizziness.   He is on cholesterol medication, crestor 5 mg and denies myalgias. His cholesterol is at  goal. The cholesterol last visit was:   Lab Results  Component Value Date   CHOL 155 10/11/2020   HDL 48 10/11/2020   LDLCALC 94 10/11/2020   TRIG 43 10/11/2020   CHOLHDL 3.2 10/11/2020   Last A1C in the  office was:  Lab Results  Component Value Date   HGBA1C 5.1 09/14/2018   Patient is on Vitamin D supplement- has been on treatment.   Lab Results  Component Value Date   VD25OH 67 02/20/2020     Denies LUTS. Last PSA was: Lab Results  Component Value Date   PSA 1.0 02/20/2020    He is on B12 supplement, unsure of dose.  Lab Results  Component Value Date   [id number] (H) 07/25/2019     Current Medications:    Current Outpatient Medications (Cardiovascular):    metoprolol tartrate (LOPRESSOR) 25 MG tablet, Take 1/2 tablet twice daily (every 12 hours) for blood pressure and heart rhythm.   rosuvastatin (CRESTOR) 5 MG tablet, TAKE 1 TABLET ONCE A DAY TO CONTROL CHOLESTEROL.  Current Outpatient Medications (Respiratory):    fluticasone (FLONASE) 50 MCG/ACT nasal spray, Place 2 sprays into both nostrils as needed for allergies.   Current Outpatient Medications (Hematological):    Cyanocobalamin (B-12 PO), Take by mouth daily.  Current Outpatient Medications (Other):    Ascorbic Acid (VITAMIN C) 100 MG tablet, Take 100 mg by mouth daily.   Cholecalciferol (VITAMIN D3 PO), Take 6,000 Units by mouth daily.    magnesium gluconate (MAGONATE) 500 MG tablet, Take 500 mg by mouth daily.   zinc gluconate 50 MG tablet, Take 50 mg by mouth daily.  Health Maintenance:  Immunization History  Administered Date(s) Administered   DTaP 08/25/2011   Influenza Inj Mdck Quad With Preservative 08/19/2017, 09/14/2018   Influenza Whole 08/29/2013   Moderna Sars-Covid-2 Vaccination 11/09/2019, 12/07/2019   Td 11/14/2020   Tetanus: 11/2020 Pneumovax: N/A Prevnar 13: N/A Flu vaccine: gets annually through county Shingrix: declines Covid 19: 2/2, moderna, 2021 declines booster   Colonoscopy: 08/2016 due 10 years  Last eye: remote, encouraged to schedule Last dental: Last 2022, q2mLast derm: [facility], last 2021 has upcoming scheduled, goes annually  Patient Care  Team: MUnk Pinto MD as PCP - General (Internal Medicine) Nahser, PWonda Cheng MD as PCP - Cardiology (Cardiology) TSueanne Margarita MD as PCP - Sleep Medicine (Cardiology) LDruscilla Brownie MD as Consulting Physician (Dermatology) MRichmond Campbell MD as Consulting Physician (Gastroenterology) HMonna Fam MD as Consulting Physician (Ophthalmology)   Medical History:  Past Medical History:  Diagnosis Date   Allergy    Asthma    Diverticulitis    Diverticulitis 09/17/2015   Essential hypertension    Hemorrhoid    Hyperlipidemia    Kidney stones    Mild dilation of ascending aorta ([facility]    Obesity    Obstructive sleep apnea 10/16/2010   Qualifier: Diagnosis of  By: CGwenette GreetMD, KArmando Reichert Not on CPAP     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    Seasonal allergies 08/24/2013   Swollen lymph nodes    abd, groin,    Allergies Allergies  Allergen Reactions   Shellfish Allergy Nausea Only   Iodine Other (See Comments)    Unknown Unknown Allergic to shellfish - nausea    SURGICAL HISTORY He  has a past surgical history that includes Vasectomy (MID 90S). FAMILY HISTORY His family  history includes Atrial fibrillation in his mother; Cancer in his brother and paternal grandmother; Diabetes in his father; Heart disease in his father; Heart failure in his father; Leukemia in his maternal grandmother; Stroke in his mother. SOCIAL HISTORY He  reports that he has never smoked. He quit smokeless tobacco use about 42 years ago.  His smokeless tobacco use included chew. He reports that he does not currently use alcohol. He reports that he does not use drugs.  Review of Systems  Constitutional: Negative.  Negative for malaise/fatigue and weight loss.  HENT: Negative.  Negative for hearing loss and tinnitus.   Eyes: Negative.  Negative for blurred vision and double vision.  Respiratory:  Negative.  Negative for cough, shortness of breath and wheezing.   Cardiovascular: Negative.  Negative for chest pain, palpitations, orthopnea, claudication and leg swelling.  Gastrointestinal: Negative.  Negative for abdominal pain, blood in stool, constipation, diarrhea, heartburn, melena, nausea and vomiting.  Genitourinary: Negative.   Musculoskeletal: Negative.  Negative for joint pain and myalgias.  Skin: Negative.  Negative for rash.  Neurological: Negative.  Negative for dizziness, tingling, sensory change, weakness and headaches.  Endo/Heme/Allergies:  Negative for polydipsia.  Psychiatric/Behavioral: Negative.    All other systems reviewed and are negative.  Physical Exam: Estimated body mass index is 37.08 kg/m as calculated from the following:   Height as of this encounter: 6' (1.829 m).   Weight as of this encounter: 273 lb 6.4 oz (124 kg). BP 132/76   Pulse 80   Temp 97.7 F (36.5 C)   Ht 6' (1.829 m)   Wt 273 lb 6.4 oz (124 kg)   SpO2 99%   BMI 37.08 kg/m  General Appearance: Well nourished, in no apparent distress.  Eyes: PERRLA, EOMs, conjunctiva no swelling or erythema Sinuses: No Frontal/maxillary tenderness  ENT/Mouth: Ext aud canals clear, normal light reflex with TMs without erythema, bulging. Good dentition. No erythema, swelling, or exudate on post pharynx. Tonsils not swollen or erythematous. Hearing normal.  Neck: Supple, thyroid normal. No bruits  Respiratory: Respiratory effort normal, BS equal bilaterally without rales, rhonchi, wheezing or stridor.  Cardio: RRR without murmurs, rubs or gallops. Brisk peripheral pulses without edema.  Chest: symmetric, with normal excursions and percussion.  Abdomen: Soft, nontender, obese,  no guarding, rebound, hernias, masses, or organomegaly.  Lymphatics: Non tender without lymphadenopathy.  Genitourinary: declines, doing self checks, no concerns Musculoskeletal: Full ROM all peripheral extremities,5/5 strength,  and normal gait.  Skin: Warm, dry without rashes, lesions, ecchymosis. Neuro: Cranial nerves intact, reflexes equal bilaterally. Normal muscle tone, no cerebellar symptoms. Sensation intact.  Psych: Awake and oriented X 3, normal affect, Insight and Judgment appropriate.   EKG: declines, follows cardiology  Izora Ribas 10:46 AM [facility]

## 2021-07-18 ENCOUNTER — Other Ambulatory Visit: Payer: Self-pay

## 2021-07-18 ENCOUNTER — Encounter: Payer: Self-pay | Admitting: Adult Health

## 2021-07-18 ENCOUNTER — Ambulatory Visit (INDEPENDENT_AMBULATORY_CARE_PROVIDER_SITE_OTHER): Payer: 59 | Admitting: Adult Health

## 2021-07-18 VITALS — BP 132/76 | HR 80 | Temp 97.7°F | Ht 72.0 in | Wt 273.4 lb

## 2021-07-18 DIAGNOSIS — Z0000 Encounter for general adult medical examination without abnormal findings: Secondary | ICD-10-CM | POA: Diagnosis not present

## 2021-07-18 DIAGNOSIS — I493 Ventricular premature depolarization: Secondary | ICD-10-CM

## 2021-07-18 DIAGNOSIS — Z0001 Encounter for general adult medical examination with abnormal findings: Secondary | ICD-10-CM

## 2021-07-18 DIAGNOSIS — G4733 Obstructive sleep apnea (adult) (pediatric): Secondary | ICD-10-CM

## 2021-07-18 DIAGNOSIS — J45909 Unspecified asthma, uncomplicated: Secondary | ICD-10-CM

## 2021-07-18 DIAGNOSIS — J302 Other seasonal allergic rhinitis: Secondary | ICD-10-CM

## 2021-07-18 DIAGNOSIS — Z125 Encounter for screening for malignant neoplasm of prostate: Secondary | ICD-10-CM

## 2021-07-18 DIAGNOSIS — I10 Essential (primary) hypertension: Secondary | ICD-10-CM

## 2021-07-18 DIAGNOSIS — E538 Deficiency of other specified B group vitamins: Secondary | ICD-10-CM

## 2021-07-18 DIAGNOSIS — Z87442 Personal history of urinary calculi: Secondary | ICD-10-CM

## 2021-07-18 DIAGNOSIS — Z131 Encounter for screening for diabetes mellitus: Secondary | ICD-10-CM

## 2021-07-18 DIAGNOSIS — K5790 Diverticulosis of intestine, part unspecified, without perforation or abscess without bleeding: Secondary | ICD-10-CM

## 2021-07-18 DIAGNOSIS — Z1329 Encounter for screening for other suspected endocrine disorder: Secondary | ICD-10-CM

## 2021-07-18 DIAGNOSIS — E785 Hyperlipidemia, unspecified: Secondary | ICD-10-CM

## 2021-07-18 DIAGNOSIS — E559 Vitamin D deficiency, unspecified: Secondary | ICD-10-CM

## 2021-07-18 MED ORDER — METOPROLOL TARTRATE 25 MG PO TABS: ORAL_TABLET | ORAL | 3 refills | Status: DC

## 2021-07-18 MED ORDER — ROSUVASTATIN CALCIUM 5 MG PO TABS: ORAL_TABLET | ORAL | 3 refills | Status: DC

## 2021-07-18 NOTE — Patient Instructions (Signed)
David Fuller , Thank you for taking time to come for your Annual Wellness Visit. I appreciate your ongoing commitment to your health goals. Please review the following plan we discussed and let me know if I can assist you in the future.   These are the goals we discussed:  Goals      Blood Pressure < 130/80     Weight (lb) < 250 lb (113.4 kg)        This is a list of the screening recommended for you and due dates:  Health Maintenance  Topic Date Due   COVID-19 Vaccine (3 - Moderna risk series) 08/03/2021*   Flu Shot  08/10/2021*   Pneumococcal Vaccination (1 - PCV) 07/18/2026*   Zoster (Shingles) Vaccine (1 of 2) 07/18/2026*   Colon Cancer Screening  08/28/2026   Tetanus Vaccine  11/14/2030   Hepatitis C Screening: USPSTF Recommendation to screen - Ages 18-79 yo.  Completed   HIV Screening  Completed   HPV Vaccine  Aged Out  *Topic was postponed. The date shown is not the original due date.     Know what a healthy weight is for you (roughly BMI <25) and aim to maintain this  Aim for 7+ servings of fruits and vegetables daily  65-80+ fluid ounces of water or unsweet tea for healthy kidneys  Limit to max 1 drink of alcohol per day; avoid smoking/tobacco  Limit animal fats in diet for cholesterol and heart health - choose grass fed whenever available  Avoid highly processed foods, and foods high in saturated/trans fats  Aim for low stress - take time to unwind and care for your mental health  Aim for 150 min of moderate intensity exercise weekly for heart health, and weights twice weekly for bone health  Aim for 7-9 hours of sleep daily     High-Fiber Eating Plan Fiber, also called dietary fiber, is a type of carbohydrate. It is found foods such as fruits, vegetables, whole grains, and beans. A high-fiber diet can have many health benefits. Your health care provider may recommend a high-fiber diet to help: Prevent constipation. Fiber can make your bowel movements  more regular. Lower your cholesterol. Relieve the following conditions: Inflammation of veins in the anus (hemorrhoids). Inflammation of specific areas of the digestive tract (uncomplicated diverticulosis). A problem of the large intestine, also called the colon, that sometimes causes pain and diarrhea (irritable bowel syndrome, or IBS). Prevent overeating as part of a weight-loss plan. Prevent heart disease, type 2 diabetes, and certain cancers. What are tips for following this plan? Reading food labels  Check the nutrition facts label on food products for the amount of dietary fiber. Choose foods that have 5 grams of fiber or more per serving. The goals for recommended daily fiber intake include: Men (age 37 or younger): 34-38 g. Men (over age 37): 28-34 g. Women (age 49 or younger): 25-28 g. Women (over age 47): 22-25 g. Your daily fiber goal is _____________ g. Shopping Choose whole fruits and vegetables instead of processed forms, such as apple juice or applesauce. Choose a wide variety of high-fiber foods such as avocados, lentils, oats, and kidney beans. Read the nutrition facts label of the foods you choose. Be aware of foods with added fiber. These foods often have high sugar and sodium amounts per serving. Cooking Use whole-grain flour for baking and cooking. Cook with brown rice instead of white rice. Meal planning Start the day with a breakfast that is high in fiber,  such as a cereal that contains 5 g of fiber or more per serving. Eat breads and cereals that are made with whole-grain flour instead of refined flour or white flour. Eat brown rice, bulgur wheat, or millet instead of white rice. Use beans in place of meat in soups, salads, and pasta dishes. Be sure that half of the grains you eat each day are whole grains. General information You can get the recommended daily intake of dietary fiber by: Eating a variety of fruits, vegetables, grains, nuts, and beans. Taking  a fiber supplement if you are not able to take in enough fiber in your diet. It is better to get fiber through food than from a supplement. Gradually increase how much fiber you consume. If you increase your intake of dietary fiber too quickly, you may have bloating, cramping, or gas. Drink plenty of water to help you digest fiber. Choose high-fiber snacks, such as berries, raw vegetables, nuts, and popcorn. What foods should I eat? Fruits Berries. Pears. Apples. Oranges. Avocado. Prunes and raisins. Dried figs. Vegetables Sweet potatoes. Spinach. Kale. Artichokes. Cabbage. Broccoli. Cauliflower. Green peas. Carrots. Squash. Grains Whole-grain breads. Multigrain cereal. Oats and oatmeal. Brown rice. Barley. Bulgur wheat. Lula. Quinoa. Bran muffins. Popcorn. Rye wafer crackers. Meats and other proteins Navy beans, kidney beans, and pinto beans. Soybeans. Split peas. Lentils. Nuts and seeds. Dairy Fiber-fortified yogurt. Beverages Fiber-fortified soy milk. Fiber-fortified orange juice. Other foods Fiber bars. The items listed above may not be a complete list of recommended foods and beverages. Contact a dietitian for more information. What foods should I avoid? Fruits Fruit juice. Cooked, strained fruit. Vegetables Fried potatoes. Canned vegetables. Well-cooked vegetables. Grains White bread. Pasta made with refined flour. White rice. Meats and other proteins Fatty cuts of meat. Fried chicken or fried fish. Dairy Milk. Yogurt. Cream cheese. Sour cream. Fats and oils Butters. Beverages Soft drinks. Other foods Cakes and pastries. The items listed above may not be a complete list of foods and beverages to avoid. Talk with your dietitian about what choices are best for you. Summary Fiber is a type of carbohydrate. It is found in foods such as fruits, vegetables, whole grains, and beans. A high-fiber diet has many benefits. It can help to prevent constipation, lower blood  cholesterol, aid weight loss, and reduce your risk of heart disease, diabetes, and certain cancers. Increase your intake of fiber gradually. Increasing fiber too quickly may cause cramping, bloating, and gas. Drink plenty of water while you increase the amount of fiber you consume. The best sources of fiber include whole fruits and vegetables, whole grains, nuts, seeds, and beans. This information is not intended to replace advice given to you by your health care provider. Make sure you discuss any questions you have with your health care provider. Document Revised: 03/01/2020 Document Reviewed: 03/01/2020 Elsevier Patient Education  2022 [employer].

## 2021-07-19 ENCOUNTER — Other Ambulatory Visit: Payer: Self-pay | Admitting: Adult Health

## 2021-07-19 LAB — COMPLETE METABOLIC PANEL WITH GFR
AG Ratio: 1.9 (calc) (ref 1.0–2.5)
ALT: 31 U/L (ref 9–46)
AST: 29 U/L (ref 10–35)
Albumin: 4.5 g/dL (ref 3.6–5.1)
Alkaline phosphatase (APISO): 67 U/L (ref 35–144)
BUN: 21 mg/dL (ref 7–25)
CO2: 26 mmol/L (ref 20–32)
Calcium: 9.7 mg/dL (ref 8.6–10.3)
Chloride: 104 mmol/L (ref 98–110)
Creat: 1.02 mg/dL (ref 0.70–1.35)
Globulin: 2.4 g/dL (calc) (ref 1.9–3.7)
Glucose, Bld: 93 mg/dL (ref 65–99)
Potassium: 4.6 mmol/L (ref 3.5–5.3)
Sodium: 139 mmol/L (ref 135–146)
Total Bilirubin: 0.8 mg/dL (ref 0.2–1.2)
Total Protein: 6.9 g/dL (ref 6.1–8.1)
eGFR: 84 mL/min/{1.73_m2} (ref >=60)

## 2021-07-19 LAB — CBC WITH DIFFERENTIAL/PLATELET
Absolute Monocytes: 728 cells/uL (ref 200–950)
Basophils Absolute: 49 cells/uL (ref 0–200)
Basophils Relative: 0.7 %
Eosinophils Absolute: 329 cells/uL (ref 15–500)
Eosinophils Relative: 4.7 %
HCT: 47.2 % (ref 38.5–50.0)
Hemoglobin: 16.1 g/dL (ref 13.2–17.1)
Lymphs Abs: 1344 cells/uL (ref 850–3900)
MCH: 30.6 pg (ref 27.0–33.0)
MCHC: 34.1 g/dL (ref 32.0–36.0)
MCV: 89.6 fL (ref 80.0–100.0)
MPV: 10.1 fL (ref 7.5–12.5)
Monocytes Relative: 10.4 %
Neutro Abs: 4550 cells/uL (ref 1500–7800)
Neutrophils Relative %: 65 %
Platelets: 252 10*3/uL (ref 140–400)
RBC: 5.27 10*6/uL (ref 4.20–5.80)
RDW: 13 % (ref 11.0–15.0)
Total Lymphocyte: 19.2 %
WBC: 7 10*3/uL (ref 3.8–10.8)

## 2021-07-19 LAB — TSH: TSH: 1.6 mIU/L (ref 0.40–4.50)

## 2021-07-19 LAB — URINALYSIS, ROUTINE W REFLEX MICROSCOPIC
Bilirubin Urine: NEGATIVE
Glucose, UA: NEGATIVE
Hgb urine dipstick: NEGATIVE
Ketones, ur: NEGATIVE
Leukocytes,Ua: NEGATIVE
Nitrite: NEGATIVE
Protein, ur: NEGATIVE
Specific Gravity, Urine: 1.026 (ref 1.001–1.035)
pH: 5.5 (ref 5.0–8.0)

## 2021-07-19 LAB — MAGNESIUM: Magnesium: 2.2 mg/dL (ref 1.5–2.5)

## 2021-07-19 LAB — LIPID PANEL
Cholesterol: 142 mg/dL (ref <200)
HDL: 45 mg/dL (ref >=40)
LDL Cholesterol (Calc): 83 mg/dL (calc)
Non-HDL Cholesterol (Calc): 97 mg/dL (calc) (ref <130)
Total CHOL/HDL Ratio: 3.2 (calc) (ref <5.0)
Triglycerides: 65 mg/dL (ref <150)

## 2021-07-19 LAB — VITAMIN B12: Vitamin B-12: >2000 pg/mL — ABNORMAL HIGH (ref 200–1100)

## 2021-07-19 LAB — MICROALBUMIN / CREATININE URINE RATIO
Creatinine, Urine: 172 mg/dL (ref 20–320)
Microalb Creat Ratio: 3 mcg/mg creat (ref <30)
Microalb, Ur: 0.5 mg/dL

## 2021-07-19 LAB — HEMOGLOBIN A1C
Hgb A1c MFr Bld: 5.5 % of total Hgb (ref <5.7)
Mean Plasma Glucose: 111 mg/dL
eAG (mmol/L): 6.2 mmol/L

## 2021-07-19 LAB — VITAMIN D 25 HYDROXY (VIT D DEFICIENCY, FRACTURES): Vit D, 25-Hydroxy: 90 ng/mL (ref 30–100)

## 2021-07-19 LAB — PSA: PSA: 1 ng/mL (ref <=4.00)

## 2021-07-19 MED ORDER — B-12 5000 MCG PO TBDP: 1.0000 | ORAL_TABLET | ORAL | Status: AC

## 2021-07-19 MED ORDER — VITAMIN D3 125 MCG (5000 UT) PO TABS: 5000.0000 [IU] | ORAL_TABLET | Freq: Every day | ORAL | Status: AC

## 2021-07-19 MED ORDER — VITAMIN C 500 MG PO TABS: 500.0000 mg | ORAL_TABLET | Freq: Every day | ORAL | Status: AC

## 2021-07-19 MED ORDER — MAGNESIUM 400 MG PO CAPS: 1.0000 | ORAL_CAPSULE | Freq: Every day | ORAL | Status: AC

## 2021-07-29 ENCOUNTER — Telehealth: Payer: 59 | Admitting: Cardiology

## 2021-09-04 ENCOUNTER — Ambulatory Visit: Payer: 59 | Admitting: Cardiology

## 2021-09-04 ENCOUNTER — Encounter: Payer: Self-pay | Admitting: Cardiology

## 2021-09-04 ENCOUNTER — Other Ambulatory Visit: Payer: Self-pay

## 2021-09-04 VITALS — BP 130/76 | HR 72 | Ht 72.0 in | Wt 273.8 lb

## 2021-09-04 DIAGNOSIS — G4733 Obstructive sleep apnea (adult) (pediatric): Secondary | ICD-10-CM | POA: Diagnosis not present

## 2021-09-04 DIAGNOSIS — I10 Essential (primary) hypertension: Secondary | ICD-10-CM

## 2021-09-04 NOTE — Progress Notes (Signed)
Date:  09/04/2021   ID:  David Fuller, DOB [birth year], MRN [id number]  PCP:  [name], MD  Cardiologist:  Mertie Moores, MD  Sleep Medicine:  Fransico Him, MD Electrophysiologist:  None   Chief Complaint:  OSA  History of Present Illness:    David Fuller is a 60 y.o. male  with a hx of PSG showed moderate obstructive sleep apnea with an AHI of 23/h mild oxygen saturations as low as 86%.  Due to ongoing respiratory events he was not able to be titrated adequately with CPAP and underwent BiPAP titration to 17/13 cm H2O.    He is doing well with his BiPAP device and thinks that he has gotten used to it.  He tolerates the nasal pillow mask and feels the pressure is adequate.  Since going on BiPAP he feels rested in the am and has no significant daytime sleepiness.  He has no significant mouth or nasal congestion.  He does not think that he snores.     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    Seasonal allergies 08/24/2013   Swollen lymph nodes    abd, groin,    Past Surgical History:  Procedure Laterality Date   VASECTOMY  MID 90S     Current Meds  Medication Sig   Cholecalciferol (VITAMIN D3) 125 MCG (5000 UT) TABS Take 1 tablet (5,000 Units total) by mouth daily.   fluticasone (FLONASE) 50 MCG/ACT nasal spray Place 2 sprays into both nostrils as needed for allergies.   Magnesium 400 MG CAPS Take 1 tablet by mouth daily.    Methylcobalamin (B-12) 5000 MCG TBDP Take 1 tablet by mouth once a week.   metoprolol tartrate (LOPRESSOR) 25 MG tablet Take 1/2 tablet twice daily (every 12 hours) for blood pressure and heart rhythm.   rosuvastatin (CRESTOR) 5 MG tablet TAKE 1 TABLET ONCE A DAY TO CONTROL CHOLESTEROL.   vitamin C (ASCORBIC ACID) 500 MG tablet Take 1 tablet (500 mg total) by mouth daily.   zinc gluconate 50 MG tablet Take 50 mg by mouth daily.     Allergies:   Shellfish allergy and Iodine   Social History   Tobacco Use   Smoking status: Never   Smokeless tobacco: Former    Types: Chew    Quit date: 1980  Vaping Use   Vaping Use: Never used  Substance Use Topics   Alcohol use: Not Currently    Alcohol/week: 0.0 - 2.0 standard drinks    Comment: 2OZ A WEEK   Drug use: No     Family Hx: The patient's family history includes Atrial fibrillation in his mother; Cancer in his brother and paternal grandmother; Diabetes in his father; Heart disease in his father; Heart failure in his father; Leukemia in his maternal grandmother; Stroke in his mother.  ROS:   Please see the history  of present illness.     All other systems reviewed and are negative.   Labs/Other Tests and Data Reviewed:    Recent Labs: 07/18/2021: ALT 31; BUN 21; Creat 1.02; Hemoglobin 16.1; Magnesium 2.2; Platelets 252; Potassium 4.6; Sodium 139; TSH 1.60   Recent Lipid Panel Lab Results  Component Value Date/Time   CHOL 142 07/18/2021 10:41 AM   CHOL 165 12/16/2018 09:31 AM   TRIG 65 07/18/2021 10:41 AM   HDL 45 07/18/2021 10:41 AM   HDL 50 12/16/2018 09:31 AM   CHOLHDL 3.2 07/18/2021 10:41 AM   LDLCALC 83 07/18/2021 10:41 AM    Wt Readings from Last 3 Encounters:  09/04/21 273 lb 12.8 oz (124.2 kg)  07/18/21 273 lb 6.4 oz (124 kg)  12/11/20 274 lb (124.3 kg)     Objective:    Vital Signs:  BP 130/76   Pulse 72   Ht 6' (1.829 m)   Wt 273 lb 12.8 oz (124.2 kg)   SpO2 98%   BMI 37.13 kg/m   GEN: Well nourished,  well developed in no acute distress HEENT: Normal NECK: No JVD; No carotid bruits LYMPHATICS: No lymphadenopathy CARDIAC:RRR, no murmurs, rubs, gallops RESPIRATORY:  Clear to auscultation without rales, wheezing or rhonchi  ABDOMEN: Soft, non-tender, non-distended MUSCULOSKELETAL:  No edema; No deformity  SKIN: Warm and dry NEUROLOGIC:  Alert and oriented x 3 PSYCHIATRIC:  Normal affect    ASSESSMENT & PLAN:    1.  OSA - The patient is tolerating PAP therapy well without any problems. The PAP download performed by his DME was personally reviewed and interpreted by me today and showed an AHI of 0.4/hr on auto BiPAP with 100% compliance in using more than 4 hours nightly.  The patient has been using and benefiting from PAP use and will continue to benefit from therapy.    2.  HTN -Continue prescription drug management with Lopressor 12.5mg  BID with PRN refills  Medication Adjustments/Labs and Tests Ordered: Current medicines are reviewed at length with the patient today.  Concerns regarding medicines are outlined above.  Tests Ordered: No orders of the defined types were placed in this encounter.  Medication Changes: No orders of the defined types were placed in this encounter.   Disposition:  Follow up in 1 year(s)  Signed, Fransico Him, MD  09/04/2021 10:28 AM    [facility]

## 2021-09-04 NOTE — Patient Instructions (Signed)

## 2021-12-05 ENCOUNTER — Ambulatory Visit (INDEPENDENT_AMBULATORY_CARE_PROVIDER_SITE_OTHER): Payer: 59 | Admitting: Adult Health

## 2021-12-05 ENCOUNTER — Other Ambulatory Visit: Payer: Self-pay

## 2021-12-05 ENCOUNTER — Encounter: Payer: Self-pay | Admitting: Adult Health

## 2021-12-05 VITALS — BP 140/82 | HR 85 | Temp 97.7°F | Wt 284.0 lb

## 2021-12-05 DIAGNOSIS — R6889 Other general symptoms and signs: Secondary | ICD-10-CM

## 2021-12-05 DIAGNOSIS — Z1152 Encounter for screening for COVID-19: Secondary | ICD-10-CM

## 2021-12-05 DIAGNOSIS — J069 Acute upper respiratory infection, unspecified: Secondary | ICD-10-CM | POA: Diagnosis not present

## 2021-12-05 DIAGNOSIS — J40 Bronchitis, not specified as acute or chronic: Secondary | ICD-10-CM | POA: Diagnosis not present

## 2021-12-05 LAB — POC COVID19 BINAXNOW: SARS Coronavirus 2 Ag: NEGATIVE

## 2021-12-05 LAB — POCT INFLUENZA A/B
Influenza A, POC: NEGATIVE
Influenza B, POC: NEGATIVE

## 2021-12-05 MED ORDER — DEXAMETHASONE SODIUM PHOSPHATE 100 MG/10ML IJ SOLN
10.0000 mg | Freq: Once | INTRAMUSCULAR | Status: AC
  Administered 2021-12-05: 10 mg via INTRAMUSCULAR

## 2021-12-05 NOTE — Patient Instructions (Signed)

## 2021-12-05 NOTE — Progress Notes (Signed)
Assessment and Plan:  David Fuller was seen today for acute visit.  Diagnoses and all orders for this visit:  Viral URI with cough Bronchitis HPI typical for viral illness which has mostly resolved Possible mild bronchitis Discussed the importance of avoiding unnecessary antibiotic therapy. Recent guidelines do not recommend abx Suggested symptomatic OTC remedies. Push hydration, can do mucinex, steam baths, cough med as needed (declined script) Steroids offered with his hx, declined oral taper but requested IM Follow up as needed. Discussed possible benefit for future URI, starting zinc lozenges and continue q2h may reduce viral duration by 1-3 days -     dexamethasone (DECADRON) injection 10 mg  Flu-like symptoms -     POCT Influenza A/B  Encounter for screening for COVID-19 -     POC COVID-19  Further disposition pending results of labs. Discussed med's effects and SE's.   Over 30 minutes of exam, counseling, chart review, and critical decision making was performed.   Future Appointments  Date Time Provider [place]  01/13/2022 10:20 AM Nahser, Wonda Cheng, MD [facility]  02/11/2022  9:30 AM Liane Comber, NP [facility] None  07/18/2022 10:00 AM Liane Comber, NP [facility] None    ------------------------------------------------------------------------------------------------------------------   HPI BP 140/82    Pulse 85    Temp 97.7 F (36.5 C)    Wt 284 lb (128.8 kg)    SpO2 99%    BMI 38.52 kg/m  61 y.o.male presents for evaluation of URI x 5 days. He tested negative for covid 19 and influenza today prior to being seen.   He repots sx began 5 days ago with sore throat (resolved with salt water gargle), then had nasal congestion (green) resolved, cough. Brief loss of sense of taste/smell but now back to normal. No fever/chills. Reports overall feeling much better but in the last 2 days has had bronchitic cough from chest, intermittent thick mucus; has  been taking nyquil and did steam bath this AM after which coughed up lots of thick mucus, minimal cough since then and feeling much better. Wife made him come today.   Has asthma as diagnosis but he states just prone to wheezing with URI; denies recent wheezing.    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    Seasonal allergies 08/24/2013   Swollen lymph nodes    abd, groin,      Allergies  Allergen Reactions   Shellfish Allergy Nausea Only   Iodine Other (See Comments)    Unknown Unknown Allergic to shellfish - nausea    Current Outpatient Medications on File Prior to Visit  Medication Sig   Cholecalciferol (VITAMIN D3) 125 MCG (5000 UT) TABS Take 1 tablet (5,000 Units total) by mouth daily.   fluticasone (FLONASE) 50 MCG/ACT nasal spray Place 2 sprays into both nostrils as needed for allergies.   Magnesium 400 MG CAPS Take 1 tablet by mouth daily.   Methylcobalamin (B-12) 5000 MCG TBDP Take 1 tablet by mouth once a  week.   metoprolol tartrate (LOPRESSOR) 25 MG tablet Take 1/2 tablet twice daily (every 12 hours) for blood pressure and heart rhythm.   rosuvastatin (CRESTOR) 5 MG tablet TAKE 1 TABLET ONCE A DAY TO CONTROL CHOLESTEROL.   vitamin C (ASCORBIC ACID) 500 MG tablet Take 1 tablet (500 mg total) by mouth daily.   zinc gluconate 50 MG tablet Take 50 mg by mouth daily.   No current facility-administered medications on file prior to visit.    ROS: all negative except above.   Physical Exam:  BP 140/82    Pulse 85    Temp 97.7 F (36.5  C)    Wt 284 lb (128.8 kg)    SpO2 99%    BMI 38.52 kg/m   General Appearance: Well nourished, in no apparent distress. Eyes: PERRLA, EOMs, conjunctiva no swelling or erythema Sinuses: No Frontal/maxillary tenderness ENT/Mouth: Ext aud canals clear, TMs without erythema, bulging. No erythema, swelling, or exudate on post pharynx.  Tonsils not swollen or erythematous. Hearing normal.  Neck: Supple, thyroid normal.  Respiratory: Respiratory effort normal, BS with bil coarse upper medial bronchitic sounds, otherwise without rales, wheezing or stridor.  Cardio: RRR with no MRGs. Brisk peripheral pulses without edema.  Abdomen: Soft, + BS.  Non tender Lymphatics: Non tender without lymphadenopathy.  Musculoskeletal: normal gait.  Skin: Warm, dry without rashes, lesions, ecchymosis.  Neuro:  Normal muscle tone Psych: Awake and oriented X 3, normal affect, Insight and Judgment appropriate.    David Ribas, NP 4:46 PM [facility]

## 2021-12-06 ENCOUNTER — Other Ambulatory Visit: Payer: Self-pay | Admitting: Internal Medicine

## 2021-12-06 MED ORDER — DEXAMETHASONE 2 MG PO TABS: ORAL_TABLET | ORAL | 0 refills | Status: DC

## 2021-12-06 MED ORDER — FLUTICASONE-SALMETEROL 100-50 MCG/ACT IN AEPB: INHALATION_SPRAY | RESPIRATORY_TRACT | 1 refills | Status: DC

## 2022-01-12 ENCOUNTER — Encounter: Payer: Self-pay | Admitting: Cardiovascular Disease

## 2022-01-12 NOTE — Progress Notes (Signed)
Cardiology Office Note    Date:  01/13/2022  ID:  David Fuller, DOB 03-06-1961, MRN [id number] PCP:  [name], MD  Cardiologist:  New, Dr. Acie Fredrickson    Chief Complaint: palpitations       Previous notes per David Copa, PA   David Fuller  is a 61 y.o. male with history of asthma, HTN, HLD, obesity, suspected OSA, seasonal allergies, diverticulitis who is being seen today for the evaluation of palpitations at the request of Dr. Rogene Fuller (EDP).  He has no prior cardiac history. He was previously told he had suspected sleep apnea due to an overnight pulse ox test in the past but never had a formal sleep study due to some technical issues with scheduling with pulmonology. In June of this year he noticed sensation of palpitations following a temporary increase in caffeine intake (6 drinks per day). He quit caffeine cold [place] and these resolved. In early September he noticed about 3 days of recurrent palpitations described as skips/flips that felt like a fullness in his throat. This began shortly after he had worked outside in the extreme heat - he thinks he had lost a lot of fluid in sweat. Due to persistent symptoms he was seen in the ED 07/19/17 where he was found to have rare PVCs. Troponin was negative. CBC wnl, BMET showed K 4.7, Cr 0.93, glucose 116. Remote MG/TSH 01/2017 wnl and LDL 146. He was advised to establish care with cardiology. He continues to have occasional palpitations most days of the week. He does not believe he's had any recent tachypalpitations. He does snore. He's been actively trying to lose weight -30lb over the last year. He wants to be healthier to be around for his 2.5-yea-old grandson. He also walks daily and rides his bike every other day. He's not had any recent chest pain, dyspnea, LEE, syncope. Palpitations improve with physical exercise.  Nov. 6, 2018:  Doing better  Still having palpitations Worse with caffiene. Is under lots of stress .    Echocardiogram from October 5 shows normal left ventricular systolic function with EF 65-70%.  He has grade 1 diastolic dysfunction.  There is very minimal ascending aortic dilatation.  Normal pulmonary artery pressures. Tries to watch his salt .   Occasional hot dog.  Has lost 32 lbs over the past year .   Walks , rides his bike   Nov. 6, 2019:  Doing well Had some PVCs Tried verapamil in place of the metoprolol  Checked his electrolytes.  OSA is well controlled.  Wt was 256 lbs   Mood seems to be OK on Celexa 20 mg a day  Exercising regularly .   Joined [employer] - goes 4 times a week  No significant arrhythmias  After workouts  Has been eating salads with chicken for the past several months   Dec. 9, 2020    Will be seen today for follow-up of his premature ventricular contractions and hypertension. Wt is 276 lbs ( (+ 20 lbs from last year)  Is walking 4 miles a day .  Admits to eating too much  Palpitations are well controlled  Feb. 1, 2022  David Fuller is seen today for follow up of his PVCs and HTN Wt today is  274 lbs  Has OSA - sees Dr. Radford Pax  Has some lightheadedness after he takes his morning metoprolol and Calan SR 120 mg  His primary MD gave his the Calan for PVCs( the metoprolol was not controlling )  Trying to walk 10,000 steps a day  Is starting to work   January 13, 2022 David Fuller is seen for follow up of his HTN and PVCs Has OSA - sees Dr. 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    Seasonal allergies 08/24/2013   Swollen lymph nodes    abd, groin,     Past Surgical History:  Procedure Laterality Date   VASECTOMY  MID 90S    Current Medications: Current Meds  Medication Sig   Cholecalciferol (VITAMIN D3) 125 MCG (5000 UT) TABS Take 1 tablet (5,000 Units total) by mouth daily.   fluticasone (FLONASE) 50 MCG/ACT nasal spray Place 2 sprays into both nostrils as needed for allergies.   Magnesium 400 MG CAPS Take 1 tablet by mouth daily.   Methylcobalamin (B-12) 5000 MCG TBDP Take 1 tablet by mouth once a week.   metoprolol tartrate (LOPRESSOR) 25 MG tablet Take 1/2 tablet twice daily (every 12 hours) for blood pressure and heart rhythm.   rosuvastatin (CRESTOR) 5 MG tablet TAKE 1 TABLET ONCE A DAY TO CONTROL CHOLESTEROL.   vitamin C (ASCORBIC ACID) 500 MG tablet Take 1 tablet (500 mg total) by mouth daily.   zinc gluconate 50 MG tablet Take 50 mg by mouth daily.     Allergies:   Shellfish allergy and Iodine   Social History   Socioeconomic History   Marital status: Married    Spouse name: Not on file   Number of children: Not on file   Years of education: Not on file   Highest education level: Not on file  Occupational History   Not on file  Tobacco Use   Smoking status: Never   Smokeless tobacco: Former    Types: Chew    Quit date: 1980  Vaping Use   Vaping Use: Never used  Substance and Sexual Activity   Alcohol use: Not Currently    Alcohol/week: 0.0 - 2.0 standard drinks    Comment: 2OZ A WEEK   Drug use: No   Sexual activity: Yes    Partners: Female    Comment: vasectomy  Other Topics Concern   Not on file  Social History Narrative   Not on file   Social Determinants of Health   Financial Resource Strain: Not on file  Food Insecurity: Not on file  Transportation Needs: Not on file  Physical Activity: Not on file  Stress: Not on  file  Social Connections: Not on file     Family History:  Family History  Problem Relation Age of Onset   Atrial fibrillation Mother    Stroke Mother    Diabetes Father    Heart disease Father    Heart failure Father    Cancer Brother        Melanoma- left arm  Leukemia Maternal Grandmother    Cancer Paternal Grandmother     ROS:   Please see the history of present illness.  All other systems are reviewed and otherwise negative.   Physical Exam: Blood pressure 140/82, pulse 75, height '6\' 1"'$  (1.854 m), weight 277 lb 6.4 oz (125.8 kg), SpO2 98 %.  GEN:  Well nourished, well developed in no acute distress HEENT: Normal NECK: No JVD; No carotid bruits LYMPHATICS: No lymphadenopathy CARDIAC: RRR , no murmurs, rubs, gallops RESPIRATORY:  Clear to auscultation without rales, wheezing or rhonchi  ABDOMEN: Soft, non-tender, non-distended MUSCULOSKELETAL:  No edema; No deformity  SKIN: Warm and dry NEUROLOGIC:  Alert and oriented x 3    Wt Readings from Last 3 Encounters:  01/13/22 277 lb 6.4 oz (125.8 kg)  12/05/21 284 lb (128.8 kg)  09/04/21 273 lb 12.8 oz (124.2 kg)      Studies/Labs Reviewed:   EKG: January 13, 2022: Normal sinus rhythm at 75.  No ST or T wave changes  Recent Labs: 07/18/2021: ALT 31; BUN 21; Creat 1.02; Hemoglobin 16.1; Magnesium 2.2; Platelets 252; Potassium 4.6; Sodium 139; TSH 1.60   Lipid Panel    Component Value Date/Time   CHOL 142 07/18/2021 1041   CHOL 165 12/16/2018 0931   TRIG 65 07/18/2021 1041   HDL 45 07/18/2021 1041   HDL 50 12/16/2018 0931   CHOLHDL 3.2 07/18/2021 1041   VLDL 18 01/13/2017 1445   LDLCALC 83 07/18/2021 1041    Additional studies/ records that were reviewed today include: Summarized above.   ASSESSMENT & PLAN:    1.  Palpitations -not having any significant palpitations.  Continue current medications.   2.  Essential HTN -blood pressure is a bit elevated.  He thinks is due to some inactivity and weight  gain.  He does not want to start losartan as I suggested.  He will continue to work on better diet, exercise and weight loss.  Follow-up with him in 1 year.   3.  Hyperlipidemia -   lipids have been well controlled.   4.   OSA-follow-up with Dr. Radford Pax.         Medication Adjustments/Labs and Tests Ordered: Current medicines are reviewed at length with the patient today.  Concerns regarding medicines are outlined above. Medication changes, Labs and Tests ordered today are summarized above and listed in the Patient Instructions accessible in Encounters.   Signed, Mertie Moores, MD  01/13/2022 1:25 PM    [facility] [place], [place], [place]  [address] Phone: [phone]; Fax: [phone]

## 2022-01-13 ENCOUNTER — Encounter: Payer: Self-pay | Admitting: Cardiovascular Disease

## 2022-01-13 ENCOUNTER — Ambulatory Visit: Payer: 59 | Admitting: Cardiovascular Disease

## 2022-01-13 ENCOUNTER — Other Ambulatory Visit: Payer: Self-pay

## 2022-01-13 VITALS — BP 140/82 | HR 75 | Ht 73.0 in | Wt 277.4 lb

## 2022-01-13 DIAGNOSIS — I10 Essential (primary) hypertension: Secondary | ICD-10-CM | POA: Diagnosis not present

## 2022-01-13 DIAGNOSIS — R002 Palpitations: Secondary | ICD-10-CM | POA: Diagnosis not present

## 2022-01-13 NOTE — Patient Instructions (Signed)
Medication Instructions:  ?Your physician recommends that you continue on your current medications as directed. Please refer to the Current Medication list given to you today. ? ?*If you need a refill on your cardiac medications before your next appointment, please call your pharmacy* ? ? ?Lab Work: ?NONE ?If you have labs (blood work) drawn today and your tests are completely normal, you will receive your results only by: ?MyChart Message (if you have MyChart) OR ?A paper copy in the mail ?If you have any lab test that is abnormal or we need to change your treatment, we will call you to review the results. ? ? ?Testing/Procedures: ?NONE ? ? ?Follow-Up: ?At [facility], you and your health needs are our priority.  As part of our continuing mission to provide you with exceptional heart care, we have created designated Provider Care Teams.  These Care Teams include your primary Cardiologist (physician) and Advanced Practice Providers (APPs -  Physician Assistants and Nurse Practitioners) who all work together to provide you with the care you need, when you need it. ? ?Your next appointment:   ?1 year(s) ? ?The format for your next appointment:   ?In Person ? ?Provider:   ?Mertie Moores, MD  or Robbie Lis, PA-C, Christen Bame, NP, or Richardson Dopp, PA-C ?

## 2022-02-11 ENCOUNTER — Ambulatory Visit: Payer: 59 | Admitting: Adult Health

## 2022-03-28 ENCOUNTER — Encounter: Payer: 59 | Admitting: Adult Health

## 2022-05-07 ENCOUNTER — Encounter: Payer: Self-pay | Admitting: Adult Health

## 2022-05-07 ENCOUNTER — Ambulatory Visit: Payer: 59 | Admitting: Adult Health

## 2022-05-07 VITALS — BP 126/94 | HR 75 | Temp 97.5°F | Wt 276.0 lb

## 2022-05-07 DIAGNOSIS — G4733 Obstructive sleep apnea (adult) (pediatric): Secondary | ICD-10-CM | POA: Diagnosis not present

## 2022-05-07 DIAGNOSIS — E785 Hyperlipidemia, unspecified: Secondary | ICD-10-CM

## 2022-05-07 DIAGNOSIS — J4520 Mild intermittent asthma, uncomplicated: Secondary | ICD-10-CM

## 2022-05-07 DIAGNOSIS — I10 Essential (primary) hypertension: Secondary | ICD-10-CM

## 2022-05-07 DIAGNOSIS — E559 Vitamin D deficiency, unspecified: Secondary | ICD-10-CM

## 2022-05-07 DIAGNOSIS — E538 Deficiency of other specified B group vitamins: Secondary | ICD-10-CM | POA: Insufficient documentation

## 2022-05-07 DIAGNOSIS — I493 Ventricular premature depolarization: Secondary | ICD-10-CM

## 2022-05-07 LAB — COMPLETE METABOLIC PANEL WITH GFR
AG Ratio: 1.7 (calc) (ref 1.0–2.5)
ALT: 27 U/L (ref 9–46)
AST: 23 U/L (ref 10–35)
Albumin: 4.5 g/dL (ref 3.6–5.1)
Alkaline phosphatase (APISO): 77 U/L (ref 35–144)
BUN: 14 mg/dL (ref 7–25)
CO2: 26 mmol/L (ref 20–32)
Calcium: 9.4 mg/dL (ref 8.6–10.3)
Chloride: 106 mmol/L (ref 98–110)
Creat: 0.9 mg/dL (ref 0.70–1.35)
Globulin: 2.7 g/dL (calc) (ref 1.9–3.7)
Glucose, Bld: 103 mg/dL — ABNORMAL HIGH (ref 65–99)
Potassium: 4.3 mmol/L (ref 3.5–5.3)
Sodium: 140 mmol/L (ref 135–146)
Total Bilirubin: 1 mg/dL (ref 0.2–1.2)
Total Protein: 7.2 g/dL (ref 6.1–8.1)
eGFR: 97 mL/min/{1.73_m2} (ref >=60)

## 2022-05-07 LAB — CBC WITH DIFFERENTIAL/PLATELET
Absolute Monocytes: 640 cells/uL (ref 200–950)
Basophils Absolute: 33 cells/uL (ref 0–200)
Basophils Relative: 0.4 %
Eosinophils Absolute: 172 cells/uL (ref 15–500)
Eosinophils Relative: 2.1 %
HCT: 47.9 % (ref 38.5–50.0)
Hemoglobin: 16.7 g/dL (ref 13.2–17.1)
Lymphs Abs: 1205 cells/uL (ref 850–3900)
MCH: 30.7 pg (ref 27.0–33.0)
MCHC: 34.9 g/dL (ref 32.0–36.0)
MCV: 88.1 fL (ref 80.0–100.0)
MPV: 10 fL (ref 7.5–12.5)
Monocytes Relative: 7.8 %
Neutro Abs: 6150 cells/uL (ref 1500–7800)
Neutrophils Relative %: 75 %
Platelets: 243 10*3/uL (ref 140–400)
RBC: 5.44 10*6/uL (ref 4.20–5.80)
RDW: 12.7 % (ref 11.0–15.0)
Total Lymphocyte: 14.7 %
WBC: 8.2 10*3/uL (ref 3.8–10.8)

## 2022-05-07 LAB — LIPID PANEL
Cholesterol: 173 mg/dL (ref <200)
HDL: 43 mg/dL (ref >=40)
LDL Cholesterol (Calc): 110 mg/dL (calc) — ABNORMAL HIGH
Non-HDL Cholesterol (Calc): 130 mg/dL (calc) — ABNORMAL HIGH (ref <130)
Total CHOL/HDL Ratio: 4 (calc) (ref <5.0)
Triglycerides: 97 mg/dL (ref <150)

## 2022-05-07 LAB — TSH: TSH: 1.66 mIU/L (ref 0.40–4.50)

## 2022-05-07 LAB — VITAMIN B12: Vitamin B-12: 536 pg/mL (ref 200–1100)

## 2022-05-07 MED ORDER — METOPROLOL TARTRATE 25 MG PO TABS: ORAL_TABLET | ORAL | 3 refills | Status: DC

## 2022-05-07 MED ORDER — ALBUTEROL SULFATE HFA 108 (90 BASE) MCG/ACT IN AERS: 2.0000 | INHALATION_SPRAY | RESPIRATORY_TRACT | 0 refills | Status: DC | PRN

## 2022-05-07 MED ORDER — ROSUVASTATIN CALCIUM 5 MG PO TABS: ORAL_TABLET | ORAL | 3 refills | Status: DC

## 2022-05-07 NOTE — Progress Notes (Signed)
3 MONTH FOLLOW UP  Assessment and Plan:   Asthma, mild intermittent Avoid triggers, albuterol PRN (refilled)  Hyperlipidemia -continue medications, LDL goal <100, check lipids, decrease fatty foods, increase activity.   Obstructive sleep apnea Weight loss advised Continue BiPAP   Essential hypertension - continue medications, cardiology follows, declines med change today  - reviewed diastolic elevation, reviewed low sodium, increase water - DASH diet, exercise and monitor at home.  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 Call if persistently greater than 130/80.   Morbid Obesity - BMI 30+ with OSA Obesity with co morbidities- long discussion about weight loss, diet, and exercise Tracking intake, calorie restricted diet with slow loss goal  Restart exercise next week Encouraged to reduce processed carbohydrate, liquid calories  Vitamin D deficiency Continue supplement, check levels annually and as needed  B12 def Has reduced dose, recheck levels - B12  Orders Placed This Encounter  Procedures   CBC with Differential/Platelet   COMPLETE METABOLIC PANEL WITH GFR   Lipid panel   TSH   Vitamin B12   Discussed med's effects and SE's. Labs and tests as requested with regular follow-up as recommended. Future Appointments  Date Time Provider [place]  07/18/2022 10:00 AM [place], Kenney Houseman, NP [facility] None    HPI 61 y.o. male patient presents for 6 month follow up on htn, hld, morbid obesity, vitamin D def.   He has asthma/allergies and is on allegra as needed, and has albuterol but rarely needs with bronchitis.   Has sleep apnea, on biPAP optimal pressure 17/13, wears nightly, wears nasal pillows. States it helps with daytime energy and feels much better.    BMI is Body mass index is 36.41 kg/m., he is working on diet and exercise with goal to get off of meds. Not exercising as much right now (typically very avid cycler), watching grandchildren every  afternoon, having to transport to swim practice and limiting time, trying to get back to biking 3 days a week with a friend starting next week. He was doing noom, lost 25 lb with this but stopped due to cost He has switched to Lose it! App, watching calories, 2100 cal/day Admits did restart 1 daily soda, working to quit this again Will eat out but choose a salad, grilled veggies Tries to make good choices, limits red meat.  Knows what he needs to do, working on reducing barriers (family limits)  Wt Readings from Last 3 Encounters:  05/07/22 276 lb (125.2 kg)  01/13/22 277 lb 6.4 oz (125.8 kg)  12/05/21 284 lb (128.8 kg)   He follows with Dr. Acie Fredrickson for palpitations/PVCs. Echo Oct 2020 showed normal EF 65-70%.  He has grade 1 diastolic dysfunction.  There is very minimal ascending aortic dilatation.  Normal pulmonary artery pressures.    His blood pressure has been controlled at home, today their BP isBP: (!) 126/94, similar by manual recheck,  BP Readings from Last 3 Encounters:  05/07/22 (!) 126/94  01/13/22 140/82  12/05/21 140/82   admits had very salty dinner, hasn't had enough water.   He does workout. He denies chest pain, shortness of breath, dizziness.   He is on cholesterol medication, crestor 5 mg and denies myalgias. His cholesterol is at goal. The cholesterol last visit was:   Lab Results  Component Value Date   CHOL 142 07/18/2021   HDL 45 07/18/2021   LDLCALC 83 07/18/2021   TRIG 65 07/18/2021   CHOLHDL 3.2 07/18/2021   Last A1C in the  office was:  Lab Results  Component Value Date   HGBA1C 5.5 07/18/2021   Patient is on Vitamin D supplement- has been on treatment.   Lab Results  Component Value Date   VD25OH 90 07/18/2021     He is on B12 supplement, has reduced from 1 daily to 1/week 5000 mcg on Saturday.  Lab Results  Component Value Date   VITAMINB12 >2,000 (H) 07/18/2021     Current Medications:    Current Outpatient Medications (Cardiovascular):     metoprolol tartrate (LOPRESSOR) 25 MG tablet, Take 1/2 tablet twice daily (every 12 hours) for blood pressure and heart rhythm.   rosuvastatin (CRESTOR) 5 MG tablet, TAKE 1 TABLET ONCE A DAY TO CONTROL CHOLESTEROL.  Current Outpatient Medications (Respiratory):    albuterol (VENTOLIN HFA) 108 (90 Base) MCG/ACT inhaler, Inhale 2 puffs into the lungs every 4 (four) hours as needed for wheezing or shortness of breath.   Cetirizine HCl 10 MG CAPS, Take 1 tablet by mouth daily as needed.   diphenhydrAMINE HCl (ALLERGY MED PO), Take by mouth as needed.   fluticasone (FLONASE) 50 MCG/ACT nasal spray, Place 2 sprays into both nostrils as needed for allergies.   Current Outpatient Medications (Hematological):    Methylcobalamin (B-12) 5000 MCG TBDP, Take 1 tablet by mouth once a week.  Current Outpatient Medications (Other):    Cholecalciferol (VITAMIN D3) 125 MCG (5000 UT) TABS, Take 1 tablet (5,000 Units total) by mouth daily.   Magnesium 400 MG CAPS, Take 1 tablet by mouth daily.   vitamin C (ASCORBIC ACID) 500 MG tablet, Take 1 tablet (500 mg total) by mouth daily.   zinc gluconate 50 MG tablet, Take 50 mg by mouth daily.   Medical History:  Past Medical History:  Diagnosis Date   Allergy    Asthma    Diverticulitis    Diverticulitis 09/17/2015   Essential hypertension    Hemorrhoid    Hyperlipidemia    Kidney stones    Mild dilation of ascending aorta (HCC)    Obesity    Obstructive sleep apnea 10/16/2010   Qualifier: Diagnosis of  By: Gwenette Greet MD, Armando Reichert  Not on CPAP     Reactive depression (situational) 02/20/2020   Patient states he was never depressed, he had situational depression that is now resolved.    Seasonal allergies 08/24/2013   Swollen lymph nodes    abd, groin,    Allergies Allergies  Allergen Reactions   Shellfish Allergy Nausea Only   Iodine Other (See Comments)    Unknown Unknown Allergic to shellfish - nausea    SURGICAL HISTORY He  has a past  surgical history that includes Vasectomy (MID 90S). FAMILY HISTORY His family history includes Atrial fibrillation in his mother; Cancer in his brother and paternal grandmother; Diabetes in his father; Heart disease in his father; Heart failure in his father; Leukemia in his maternal grandmother; Stroke in his mother. SOCIAL HISTORY He  reports that he has never smoked. He quit smokeless tobacco use about 43 years ago.  His smokeless tobacco use included chew. He reports that he does not currently use alcohol. He reports that he does not use drugs.  Review of Systems  Constitutional: Negative.  Negative for malaise/fatigue and weight loss.  HENT: Negative.  Negative for hearing loss and tinnitus.   Eyes: Negative.  Negative for blurred vision and double vision.  Respiratory: Negative.  Negative for cough, shortness of breath and wheezing.   Cardiovascular: Negative.  Negative for  chest pain, palpitations, orthopnea, claudication and leg swelling.  Gastrointestinal: Negative.  Negative for abdominal pain, blood in stool, constipation, diarrhea, heartburn, melena, nausea and vomiting.  Genitourinary: Negative.   Musculoskeletal: Negative.  Negative for joint pain and myalgias.  Skin: Negative.  Negative for rash.  Neurological: Negative.  Negative for dizziness, tingling, sensory change, weakness and headaches.  Endo/Heme/Allergies:  Negative for polydipsia.  Psychiatric/Behavioral: Negative.    All other systems reviewed and are negative.   Physical Exam: Estimated body mass index is 36.41 kg/m as calculated from the following:   Height as of 01/13/22: '6\' 1"'$  (1.854 m).   Weight as of this encounter: 276 lb (125.2 kg). BP (!) 126/94   Pulse 75   Temp (!) 97.5 F (36.4 C)   Wt 276 lb (125.2 kg)   SpO2 98%   BMI 36.41 kg/m  General Appearance: Well nourished, in no apparent distress.  Eyes: PERRLA, EOMs, conjunctiva no swelling or erythema Sinuses: No Frontal/maxillary tenderness   ENT/Mouth: Ext aud canals clear, normal light reflex with TMs without erythema, bulging. Good dentition. No erythema, swelling, or exudate on post pharynx. Tonsils not swollen or erythematous. Hearing normal.  Neck: Supple, thyroid normal. No bruits  Respiratory: Respiratory effort normal, BS equal bilaterally without rales, rhonchi, wheezing or stridor.  Cardio: RRR without murmurs, rubs or gallops. Brisk peripheral pulses without edema.  Abdomen: Soft, nontender, obese,  no guarding, rebound, hernias, masses, or organomegaly.  Musculoskeletal: Full ROM all peripheral extremities,5/5 strength, and normal gait.  Skin: Warm, dry without rashes, lesions, ecchymosis. Neuro: Cranial nerves intact, reflexes equal bilaterally. Normal muscle tone, no cerebellar symptoms. Sensation intact.  Psych: Awake and oriented X 3, normal affect, Insight and Judgment appropriate.   Izora Ribas, NP-C 12:24 PM [facility]

## 2022-07-18 ENCOUNTER — Encounter: Payer: 59 | Admitting: Nurse Practitioner

## 2022-09-03 ENCOUNTER — Encounter: Payer: 59 | Admitting: Nurse Practitioner

## 2022-09-23 ENCOUNTER — Emergency Department (HOSPITAL_COMMUNITY): Payer: 59

## 2022-09-23 ENCOUNTER — Encounter (HOSPITAL_COMMUNITY): Payer: Self-pay | Admitting: *Deleted

## 2022-09-23 ENCOUNTER — Other Ambulatory Visit: Payer: Self-pay

## 2022-09-23 ENCOUNTER — Emergency Department (HOSPITAL_COMMUNITY)
Admission: EM | Admit: 2022-09-23 | Discharge: 2022-09-23 | Disposition: A | Discharge status: Home / Self Care | Payer: 59 | Attending: Emergency Medicine | Admitting: Emergency Medicine

## 2022-09-23 DIAGNOSIS — R1013 Epigastric pain: Secondary | ICD-10-CM | POA: Insufficient documentation

## 2022-09-23 DIAGNOSIS — I10 Essential (primary) hypertension: Secondary | ICD-10-CM | POA: Diagnosis not present

## 2022-09-23 DIAGNOSIS — K921 Melena: Secondary | ICD-10-CM | POA: Insufficient documentation

## 2022-09-23 DIAGNOSIS — K279 Peptic ulcer, site unspecified, unspecified as acute or chronic, without hemorrhage or perforation: Secondary | ICD-10-CM | POA: Insufficient documentation

## 2022-09-23 DIAGNOSIS — R195 Other fecal abnormalities: Secondary | ICD-10-CM

## 2022-09-23 DIAGNOSIS — Z8711 Personal history of peptic ulcer disease: Secondary | ICD-10-CM

## 2022-09-23 LAB — COMPREHENSIVE METABOLIC PANEL
ALT: 31 U/L (ref 0–44)
AST: 31 U/L (ref 15–41)
Albumin: 3.9 g/dL (ref 3.5–5.0)
Alkaline Phosphatase: 61 U/L (ref 38–126)
Anion gap: 6 (ref 5–15)
BUN: 12 mg/dL (ref 8–23)
CO2: 24 mmol/L (ref 22–32)
Calcium: 8.8 mg/dL — ABNORMAL LOW (ref 8.9–10.3)
Chloride: 108 mmol/L (ref 98–111)
Creatinine, Ser: 0.84 mg/dL (ref 0.61–1.24)
GFR, Estimated: >60 mL/min (ref >60)
Glucose, Bld: 129 mg/dL — ABNORMAL HIGH (ref 70–99)
Potassium: 4.1 mmol/L (ref 3.5–5.1)
Sodium: 138 mmol/L (ref 135–145)
Total Bilirubin: 1.2 mg/dL (ref 0.3–1.2)
Total Protein: 7.1 g/dL (ref 6.5–8.1)

## 2022-09-23 LAB — CBC WITH DIFFERENTIAL/PLATELET
Abs Immature Granulocytes: 0.02 10*3/uL (ref 0.00–0.07)
Basophils Absolute: 0.1 10*3/uL (ref 0.0–0.1)
Basophils Relative: 1 %
Eosinophils Absolute: 0.1 10*3/uL (ref 0.0–0.5)
Eosinophils Relative: 1 %
HCT: 45.7 % (ref 39.0–52.0)
Hemoglobin: 15.9 g/dL (ref 13.0–17.0)
Immature Granulocytes: 0 %
Lymphocytes Relative: 7 %
Lymphs Abs: 0.5 10*3/uL — ABNORMAL LOW (ref 0.7–4.0)
MCH: 30.2 pg (ref 26.0–34.0)
MCHC: 34.8 g/dL (ref 30.0–36.0)
MCV: 86.7 fL (ref 80.0–100.0)
Monocytes Absolute: 0.9 10*3/uL (ref 0.1–1.0)
Monocytes Relative: 11 %
Neutro Abs: 6.3 10*3/uL (ref 1.7–7.7)
Neutrophils Relative %: 80 %
Platelets: 196 10*3/uL (ref 150–400)
RBC: 5.27 MIL/uL (ref 4.22–5.81)
RDW: 12.2 % (ref 11.5–15.5)
WBC: 7.8 10*3/uL (ref 4.0–10.5)
nRBC: 0 % (ref 0.0–0.2)

## 2022-09-23 LAB — POC OCCULT BLOOD, ED: Fecal Occult Bld: NEGATIVE

## 2022-09-23 LAB — PROTIME-INR
INR: 1.1 (ref 0.8–1.2)
Prothrombin Time: 13.8 seconds (ref 11.4–15.2)

## 2022-09-23 LAB — LIPASE, BLOOD: Lipase: 35 U/L (ref 11–51)

## 2022-09-23 MED ORDER — FAMOTIDINE IN NACL 20-0.9 MG/50ML-% IV SOLN
20.0000 mg | Freq: Once | INTRAVENOUS | Status: AC
  Administered 2022-09-23: 20 mg via INTRAVENOUS
  Filled 2022-09-23: qty 50

## 2022-09-23 MED ORDER — LIDOCAINE VISCOUS HCL 2 % MT SOLN
15.0000 mL | Freq: Once | OROMUCOSAL | Status: AC
  Administered 2022-09-23: 15 mL via ORAL
  Filled 2022-09-23: qty 15

## 2022-09-23 MED ORDER — FAMOTIDINE 20 MG PO TABS: 20.0000 mg | ORAL_TABLET | Freq: Two times a day (BID) | ORAL | 0 refills | Status: DC

## 2022-09-23 MED ORDER — LACTATED RINGERS IV BOLUS
1000.0000 mL | Freq: Once | INTRAVENOUS | Status: AC
  Administered 2022-09-23: 1000 mL via INTRAVENOUS

## 2022-09-23 MED ORDER — ALUM & MAG HYDROXIDE-SIMETH 200-200-20 MG/5ML PO SUSP
30.0000 mL | Freq: Once | ORAL | Status: AC
  Administered 2022-09-23: 30 mL via ORAL
  Filled 2022-09-23: qty 30

## 2022-09-23 MED ORDER — PANTOPRAZOLE SODIUM 40 MG PO TBEC: 40.0000 mg | DELAYED_RELEASE_TABLET | Freq: Every day | ORAL | 0 refills | Status: DC

## 2022-09-23 NOTE — ED Provider Notes (Addendum)
[place] DEPT Provider Note   CSN: [id number] Arrival date & time: 09/23/22  7829     History  Chief Complaint  Patient presents with   GI Problem    QUINTEZ MASELLI is a 61 y.o. male.  HPI   61 year old male with medical history significant for hemorrhoids, diverticulitis, nephrolithiasis, HLD, OSA, HTN, obesity, mild dilation of the ascending aorta, history of peptic ulcer disease and H pylori s/p triple therapy who presents to the emergency department with epigastric abdominal discomfort and black stools.  The patient states that he has been having pain associated with likely arthritis and has been taking heavy doses of NSAIDs over the past month.  He states that he has frequently been ingesting ibuprofen.  He knows that this can worsen his longstanding peptic ulcer.  He states he has a history of peptic ulcer and H. pylori infection status posttreatment years ago.  Over the past few days, he has noticed worsening epigastric abdominal discomfort.  No radiation of the pain.  Pain is sharp and 3 out of 10 in severity at present.  He started taking Pepto-Bismol yesterday and this morning had 3 episodes of  dark stools.  He denies any hematemesis or hematochezia.     Prior to Admission medications   Medication Sig Start Date End Date Taking? Authorizing Provider  famotidine (PEPCID) 20 MG tablet Take 1 tablet (20 mg total) by mouth 2 (two) times daily. 09/23/22 10/23/22 Yes Regan Lemming, MD  pantoprazole (PROTONIX) 40 MG tablet Take 1 tablet (40 mg total) by mouth daily. 09/23/22 10/23/22 Yes Regan Lemming, MD  albuterol (VENTOLIN HFA) 108 (90 Base) MCG/ACT inhaler Inhale 2 puffs into the lungs every 4 (four) hours as needed for wheezing or shortness of breath. 05/07/22   Liane Comber, NP  Cetirizine HCl 10 MG CAPS Take 1 tablet by mouth daily as needed.    [provider]  Cholecalciferol (VITAMIN D3) 125 MCG (5000 UT) TABS Take 1  tablet (5,000 Units total) by mouth daily. 07/19/21   Liane Comber, NP  diphenhydrAMINE HCl (ALLERGY MED PO) Take by mouth as needed.    [provider]  fluticasone (FLONASE) 50 MCG/ACT nasal spray Place 2 sprays into both nostrils as needed for allergies. 06/17/16   Rolene Course, PA-C  Magnesium 400 MG CAPS Take 1 tablet by mouth daily. 07/19/21   Liane Comber, NP  Methylcobalamin (B-12) 5000 MCG TBDP Take 1 tablet by mouth once a week. 07/19/21   Liane Comber, NP  metoprolol tartrate (LOPRESSOR) 25 MG tablet Take 1/2 tablet twice daily (every 12 hours) for blood pressure and heart rhythm. 05/07/22   Liane Comber, NP  rosuvastatin (CRESTOR) 5 MG tablet TAKE 1 TABLET ONCE A DAY TO CONTROL CHOLESTEROL. 05/07/22   Liane Comber, NP  vitamin C (ASCORBIC ACID) 500 MG tablet Take 1 tablet (500 mg total) by mouth daily. 07/19/21   Liane Comber, NP  zinc gluconate 50 MG tablet Take 50 mg by mouth daily.    [provider]      Allergies    Shellfish allergy and Iodine    Review of Systems   Review of Systems  All other systems reviewed and are negative.   Physical Exam Updated Vital Signs BP (!) 160/95   Pulse 71   Temp 98.4 F (36.9 C) (Oral)   Resp 11   Ht '6\' 1"'$  (1.854 m)   Wt 124.7 kg   SpO2 99%   BMI 36.28 kg/m  Physical Exam Vitals and nursing note reviewed. Exam conducted with a chaperone present.  Constitutional:      General: He is not in acute distress.    Appearance: He is well-developed.  HENT:     Head: Normocephalic and atraumatic.  Eyes:     Conjunctiva/sclera: Conjunctivae normal.  Cardiovascular:     Rate and Rhythm: Normal rate and regular rhythm.     Heart sounds: No murmur heard. Pulmonary:     Effort: Pulmonary effort is normal. No respiratory distress.  Abdominal:     Palpations: Abdomen is soft.     Tenderness: There is no abdominal tenderness.     Comments: No significant abdominal tenderness on exam, no rebound or guarding   Genitourinary:    Rectum: Guaiac result negative. No tenderness.     Comments: Black stool at the anus and on the exam glove, fecal occult negative Musculoskeletal:        General: No swelling.     Cervical back: Neck supple.     Comments: Right shoulder with range of motion intact, no tenderness, 2+ distal pulses  Skin:    General: Skin is warm and dry.     Capillary Refill: Capillary refill takes less than 2 seconds.  Neurological:     Mental Status: He is alert.  Psychiatric:        Mood and Affect: Mood normal.     ED Results / Procedures / Treatments   Labs (all labs ordered are listed, but only abnormal results are displayed) Labs Reviewed  COMPREHENSIVE METABOLIC PANEL - Abnormal; Notable for the following components:      Result Value   Glucose, Bld 129 (*)    Calcium 8.8 (*)    All other components within normal limits  CBC WITH DIFFERENTIAL/PLATELET - Abnormal; Notable for the following components:   Lymphs Abs 0.5 (*)    All other components within normal limits  PROTIME-INR  LIPASE, BLOOD  POC OCCULT BLOOD, ED    EKG None  Radiology DG Chest Port 1 View  Result Date: 09/23/2022 CLINICAL DATA:  Peptic ulcer disease.  History of asthma. EXAM: PORTABLE CHEST 1 VIEW COMPARISON:  08/16/2017 FINDINGS: Heart size upper limits of normal. Mediastinal shadows are normal allowing for the technical factors. The lungs are clear. Overlying artifact. No pleural effusion. No acute bone finding. IMPRESSION: No active disease. Heart upper limits of normal in size. Electronically Signed   By: Nelson Chimes M.D.   On: 09/23/2022 09:38    Procedures Procedures    Medications Ordered in ED Medications  lactated ringers bolus 1,000 mL (0 mLs Intravenous Stopped 09/23/22 1119)  famotidine (PEPCID) IVPB 20 mg premix (0 mg Intravenous Stopped 09/23/22 1119)  alum & mag hydroxide-simeth (MAALOX/MYLANTA) 200-200-20 MG/5ML suspension 30 mL (30 mLs Oral Given 09/23/22 1157)    And   lidocaine (XYLOCAINE) 2 % viscous mouth solution 15 mL (15 mLs Oral Given 09/23/22 1157)    ED Course/ Medical Decision Making/ A&P                           Medical Decision Making Amount and/or Complexity of Data Reviewed Labs: ordered. Radiology: ordered.  Risk OTC drugs. Prescription drug management.    61 year old male with medical history significant for hemorrhoids, diverticulitis, nephrolithiasis, HLD, OSA, HTN, obesity, mild dilation of the ascending aorta, history of peptic ulcer disease and H pylori s/p triple therapy who presents to the emergency department with  epigastric abdominal discomfort and black stools.  The patient states that he has been having pain associated with likely arthritis and has been taking heavy doses of NSAIDs over the past month.  He states that he has frequently been ingesting ibuprofen.  He knows that this can worsen his longstanding peptic ulcer.  He states he has a history of peptic ulcer and H. pylori infection status posttreatment years ago.  Over the past few days, he has noticed worsening epigastric abdominal discomfort.  No radiation of the pain.  Pain is sharp and 3 out of 10 in severity at present.  He started taking Pepto-Bismol yesterday and this morning had 3 episodes of  dark stools.  He denies any hematemesis or hematochezia.    On arrival, the patient was vitally stable, NSR noted on cardiac telemetry, mildly hypertensive BP 160/101, otherwise unremarkable vitals.  Physical exam with an abdominal exam that was soft, nontender, nondistended, no rebound or guarding.  A rectal exam was negative for occult blood, dark stool was noted.  Patient could be experiencing symptoms from a peptic ulcer in the setting of NSAID use for the last month.  Low concern for perforation.  Based on the patient's abdominal exam, low concern for acute intra abdominal abnormality.  X-ray imaging was performed which revealed no evidence of free air under the  diaphragm.  IV access was obtained and the patient was administered IV Pepcid and IV fluid bolus.  His fecal occult was negative, PT/INR was normal, lipase was negative, CMP without significant electrolyte abnormality, normal renal and liver function, CBC without a leukocytosis.  His hemoglobin was also normal at 15.9.  I did speak with Dr. Watt Climes of on-call gastroenterology who recommended no further intervention, discharge and follow-up in clinic. The patient was advised to hold on any further NSAID use, utilize Tylenol for pain control.    Overall stable at the time of reassessment, plan for discharge and outpatient gastroenterology follow-up was conveyed to the patient.  Prescription for Pepcid and Protonix was placed.    The patient has been appropriately medically screened and/or stabilized in the ED. I have low suspicion for any other emergent medical condition which would require further screening, evaluation or treatment in the ED or require inpatient management.   Final Clinical Impression(s) / ED Diagnoses Final diagnoses:  Epigastric pain  Dark stools  Hx of peptic ulcer    Rx / DC Orders ED Discharge Orders          Ordered    Ambulatory referral to Gastroenterology        09/23/22 1129    famotidine (PEPCID) 20 MG tablet  2 times daily        09/23/22 1201    pantoprazole (PROTONIX) 40 MG tablet  Daily        09/23/22 1201              Regan Lemming, MD 09/23/22 1132    Regan Lemming, MD 09/23/22 1202

## 2022-09-23 NOTE — ED Triage Notes (Signed)
Sunday evening pain in abd, pt has history of ulcer. Today black stools although not tarry texture.

## 2022-09-23 NOTE — ED Notes (Signed)
Pt standing in room, pt dressed and ready to go home, pt denies pain, pt verbalized understanding d/c and follow up. Pt ambulatory from dpt

## 2022-09-23 NOTE — Discharge Instructions (Addendum)
Your laboratory evaluation and physical exam are reassuring. Your dark stools are likely due to your recent pepto bismol use. Your stool was negative for occult blood. Your symptoms could certainly be due to a peptic ulcer flare up in the setting of NSAID use. Recommend you stop NSAIDs, utilize Tylenol for pain control. Will prescribe Pepcid, Protonix, and have you follow-up with gastroenterology in clinic.

## 2022-09-23 NOTE — ED Notes (Signed)
Pt ambulatory to bathroom with steady gait.

## 2022-09-24 ENCOUNTER — Ambulatory Visit (HOSPITAL_BASED_OUTPATIENT_CLINIC_OR_DEPARTMENT_OTHER)
Admission: RE | Admit: 2022-09-24 | Discharge: 2022-09-24 | Disposition: A | Discharge status: Home / Self Care | Payer: 59 | Source: Ambulatory Visit | Attending: Nurse Practitioner | Admitting: Nurse Practitioner

## 2022-09-24 ENCOUNTER — Ambulatory Visit (INDEPENDENT_AMBULATORY_CARE_PROVIDER_SITE_OTHER): Payer: 59 | Admitting: Nurse Practitioner

## 2022-09-24 ENCOUNTER — Encounter: Payer: Self-pay | Admitting: Nurse Practitioner

## 2022-09-24 VITALS — BP 138/70 | HR 103 | Temp 97.5°F | Ht 73.0 in | Wt 279.0 lb

## 2022-09-24 DIAGNOSIS — Z79899 Other long term (current) drug therapy: Secondary | ICD-10-CM

## 2022-09-24 DIAGNOSIS — R1012 Left upper quadrant pain: Secondary | ICD-10-CM | POA: Diagnosis not present

## 2022-09-24 DIAGNOSIS — K279 Peptic ulcer, site unspecified, unspecified as acute or chronic, without hemorrhage or perforation: Secondary | ICD-10-CM

## 2022-09-24 DIAGNOSIS — M25511 Pain in right shoulder: Secondary | ICD-10-CM

## 2022-09-24 MED ORDER — SUCRALFATE 1 G PO TABS: 1.0000 g | ORAL_TABLET | Freq: Four times a day (QID) | ORAL | 1 refills | Status: DC

## 2022-09-24 MED ORDER — DEXAMETHASONE SODIUM PHOSPHATE 10 MG/ML IJ SOLN
10.0000 mg | Freq: Once | INTRAMUSCULAR | Status: AC
  Administered 2022-09-24: 10 mg via INTRAMUSCULAR

## 2022-09-24 NOTE — Patient Instructions (Signed)
Peptic Ulcer  A peptic ulcer is a painful sore in the lining of your stomach or the first part of your small intestine. What are the causes? Common causes of this condition include: An infection. Using certain pain medicines too often or too much. Rare tumors in the stomach, small intestine, or pancreas. What increases the risk? You are more likely to get this condition if you: Smoke. Have a family history of ulcer disease. Drink alcohol. Have been hospitalized in an intensive care unit (ICU). What are the signs or symptoms? Symptoms include: Burning pain in the area between the chest and the belly button. The pain may: Not go away (be persistent). Be worse when your stomach is empty. Be worse at night. Heartburn. Feeling sick to your stomach (nauseous) and throwing up (vomiting). Bloating. If the ulcer results in bleeding, it can cause you to: Have poop (stool) that is black and looks like tar. Throw up bright red blood. Throw up material that looks like coffee grounds. How is this treated? Treatment for this condition may include: Stopping things that can cause the ulcer, such as: Smoking. Using pain medicines. Drinking alcohol or caffeine. Medicines to reduce stomach acid. Antibiotic medicines if the ulcer is caused by an infection. A procedure that is done using a small, flexible tube that has a camera at the end (upper endoscopy). This may be done if you have a bleeding ulcer. Surgery. This may be needed if: You have a lot of bleeding. The ulcer caused a hole somewhere in the digestive system. Follow these instructions at home: Do not drink alcohol if your doctor tells you not to drink. Limit how much caffeine you take in. Do not smoke or use any products that contain nicotine or tobacco. If you need help quitting, ask your doctor. Take over-the-counter and prescription medicines only as told by your doctor. Do not stop or change your medicines unless you talk with  your doctor about it first. Do not take aspirin, ibuprofen, or other NSAIDs unless your doctor told you to do so. Keep all follow-up visits. Contact a doctor if: You do not get better in 7 days after you start treatment. You keep having an upset stomach (indigestion) or heartburn. Get help right away if: You have sudden, sharp pain in your belly (abdomen). You have belly pain that does not go away. You have bloody poop (stool) or black, tarry poop. You throw up blood. It may look like coffee grounds. You feel light-headed or feel like you may pass out (faint). You get weak. You get sweaty or feel sticky and cold to the touch (clammy). These symptoms may be an emergency. Get help right away. Call 911. Do not wait to see if the symptoms will go away. Do not drive yourself to the hospital. Summary Symptoms of a peptic ulcer include burning pain in the area between the chest and the belly button. Do not smoke or use any products that contain nicotine or tobacco. If you need help quitting, ask your doctor. Take medicines only as told by your doctor. Limit how much alcohol and caffeine you have. Keep all follow-up visits. This information is not intended to replace advice given to you by your health care provider. Make sure you discuss any questions you have with your health care provider. Document Revised: 06/07/2021 Document Reviewed: 06/07/2021 Elsevier Patient Education  [employer].

## 2022-09-24 NOTE — Progress Notes (Signed)
Hospital follow up  Assessment and Plan: Hospital visit follow up for:   1. Peptic ulcer Continue Pepcid and Protonix Take Carafate first on empty stomach then take Pepcid or Protonix as directed or as needed Wt loss, avoid meals 2-3h before bedtime. Consider eliminating food triggers:  chocolate, caffeine, EtOH, acid/spicy food. Refer back to GI for evaluation of PUD, EGD/Colonoscopy  - sucralfate (CARAFATE) 1 g tablet; Take 1 tablet (1 g total) by mouth 4 (four) times daily.  Dispense: 120 tablet; Refill: 1 - CT ABDOMEN PELVIS W WO CONTRAST; Future - Ambulatory referral to Gastroenterology  2. Left upper quadrant abdominal pain  - CT ABDOMEN PELVIS W WO CONTRAST; Future - Ambulatory referral to Gastroenterology  3. Acute pain of right shoulder Do not take any NSAIDs, [facility] RICE Method Topical Voltaren gel/Lidocaine PRN Stay well hydrated  - dexamethasone (DECADRON) injection 10 mg  4. Medication management All medications discussed and reviewed in full. All questions and concerns regarding medications addressed.     All medications were reviewed with patient and family and fully reconciled. All questions answered fully, and patient and family members were encouraged to call the office with any further questions or concerns. Discussed goal to avoid readmission related to this diagnosis.   Over 30 minutes of exam, counseling, chart review, and complex, high/moderate level critical decision making was performed this visit.   Future Appointments  Date Time Provider [place]  09/24/2022  2:30 PM Darrol Jump, NP [facility] None  12/24/2022  9:40 AM Sueanne Margarita, MD [facility]  12/24/2022 10:20 AM Nahser, Wonda Cheng, MD [facility]     HPI 61 y.o.male presents for follow up for transition from recent hospitalization or [facility] stay. Admit date to the hospital was 09/23/22, patient was discharged from the hospital on 09/23/22 and our  clinical staff contacted the office the day after discharge to set up a follow up appointment. The discharge summary, medications, and diagnostic test results were reviewed before meeting with the patient. The patient was admitted for epigastric pain and black stools.  Reports that she was taking heavy doses of NSAIDs over the past month, including Ibuprofen.  He has a long standing peptic ulcer.  Denies hematemesis or hematochezia.  Rectal exam was negative for occult blood, dark stool was noted.  There was low concern for perforation.  Most likely symptoms from peptic ulcer in the setting of NSAID use. X-ray revealed no evidence of free air under the diaphragm.  He was administered IV Pepcid and IV fluid bolus.  Blood work was stable including Hgb at 15.9.  Dr. Watt Climes was contacted and recommended no further intervention.  He was asked to hold any further NSAID use and use Tylenol for pain. He was asked to f/u with GI and prescribed Pepcid and Protonix.  These continues not to work and patient continues to be very uncomfortable and in pain.    He does have a hx of diverticulosis and nephrolithiasis with anorectal fissure.  He has been taking IBU for arthritic knee and shoulder pain.    Recent CPE on 07/2021 and no concerning issues.  Last colonoscopy Colonoscopy 2017 with recall due 10 years  Images while in the hospital: [facility] Chest Port 1 View  Result Date: 09/23/2022 CLINICAL DATA:  Peptic ulcer disease.  History of asthma. EXAM: PORTABLE CHEST 1 VIEW COMPARISON:  08/16/2017 FINDINGS: Heart size upper limits of normal. Mediastinal shadows are normal allowing for the technical factors. The lungs are clear. Overlying artifact.  No pleural effusion. No acute bone finding. IMPRESSION: No active disease. Heart upper limits of normal in size. Electronically Signed   By: Nelson Chimes M.D.   On: 09/23/2022 09:38      Current Outpatient Medications (Cardiovascular):    metoprolol tartrate (LOPRESSOR) 25  MG tablet, Take 1/2 tablet twice daily (every 12 hours) for blood pressure and heart rhythm.   rosuvastatin (CRESTOR) 5 MG tablet, TAKE 1 TABLET ONCE A DAY TO CONTROL CHOLESTEROL.  Current Outpatient Medications (Respiratory):    albuterol (VENTOLIN HFA) 108 (90 Base) MCG/ACT inhaler, Inhale 2 puffs into the lungs every 4 (four) hours as needed for wheezing or shortness of breath.   Cetirizine HCl 10 MG CAPS, Take 1 tablet by mouth daily as needed.   diphenhydrAMINE HCl (ALLERGY MED PO), Take by mouth as needed.   fluticasone (FLONASE) 50 MCG/ACT nasal spray, Place 2 sprays into both nostrils as needed for allergies.   Current Outpatient Medications (Hematological):    Methylcobalamin (B-12) 5000 MCG TBDP, Take 1 tablet by mouth once a week.  Current Outpatient Medications (Other):    Cholecalciferol (VITAMIN D3) 125 MCG (5000 UT) TABS, Take 1 tablet (5,000 Units total) by mouth daily.   famotidine (PEPCID) 20 MG tablet, Take 1 tablet (20 mg total) by mouth 2 (two) times daily.   Magnesium 400 MG CAPS, Take 1 tablet by mouth daily.   pantoprazole (PROTONIX) 40 MG tablet, Take 1 tablet (40 mg total) by mouth daily.   vitamin C (ASCORBIC ACID) 500 MG tablet, Take 1 tablet (500 mg total) by mouth daily.   zinc gluconate 50 MG tablet, Take 50 mg by mouth daily.  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    Seasonal allergies 08/24/2013   Swollen lymph nodes    abd, groin,      Allergies  Allergen Reactions   Shellfish Allergy Nausea Only   Iodine Other  (See Comments)    Unknown Unknown Allergic to shellfish - nausea    ROS: all negative except above.   Physical Exam: There were no vitals filed for this visit. There were no vitals taken for this visit. General Appearance: Well nourished, in no apparent distress. Eyes: PERRLA, EOMs, conjunctiva no swelling or erythema Sinuses: No Frontal/maxillary tenderness ENT/Mouth: Ext aud canals clear, TMs without erythema, bulging. No erythema, swelling, or exudate on post pharynx.  Tonsils not swollen or erythematous. Hearing normal.  Neck: Supple, thyroid normal.  Respiratory: Respiratory effort normal, BS equal bilaterally without rales, rhonchi, wheezing or stridor.  Cardio: RRR with no MRGs. Brisk peripheral pulses without edema.  Abdomen: Soft, + BS.  Non tender, no guarding, rebound, hernias, masses. Lymphatics: Non tender without lymphadenopathy.  Musculoskeletal: Full ROM, 5/5 strength, normal gait.  Skin: Warm, dry without rashes, lesions, ecchymosis.  Neuro: Cranial nerves intact. Normal muscle tone, no cerebellar symptoms. Sensation intact.  Psych: Awake and oriented X 3, normal affect, Insight and Judgment appropriate.     Mariyana Fulop  Morley Gaumer, NP 11:50 AM [facility]

## 2022-09-25 ENCOUNTER — Other Ambulatory Visit: Payer: Self-pay | Admitting: Nurse Practitioner

## 2022-09-25 ENCOUNTER — Encounter: Payer: 59 | Admitting: Nurse Practitioner

## 2022-09-25 ENCOUNTER — Encounter: Payer: Self-pay | Admitting: Nurse Practitioner

## 2022-09-25 MED ORDER — SUCRALFATE 1 GM/10ML PO SUSP: 1.0000 g | Freq: Four times a day (QID) | ORAL | 0 refills | Status: DC

## 2022-10-09 ENCOUNTER — Encounter: Payer: Self-pay | Admitting: Nurse Practitioner

## 2022-10-09 ENCOUNTER — Ambulatory Visit: Payer: 59 | Admitting: Internal Medicine

## 2022-10-09 ENCOUNTER — Ambulatory Visit: Payer: 59 | Admitting: Nurse Practitioner

## 2022-10-09 ENCOUNTER — Other Ambulatory Visit: Payer: Self-pay

## 2022-10-09 VITALS — HR 82 | Temp 97.1°F

## 2022-10-09 DIAGNOSIS — U071 COVID-19: Secondary | ICD-10-CM | POA: Diagnosis not present

## 2022-10-09 DIAGNOSIS — R6889 Other general symptoms and signs: Secondary | ICD-10-CM

## 2022-10-09 LAB — POC COVID19 BINAXNOW: SARS Coronavirus 2 Ag: POSITIVE — AB

## 2022-10-09 LAB — POCT INFLUENZA A/B
Influenza A, POC: NEGATIVE
Influenza B, POC: NEGATIVE

## 2022-10-09 MED ORDER — AZITHROMYCIN 250 MG PO TABS: ORAL_TABLET | ORAL | 1 refills | Status: DC

## 2022-10-09 MED ORDER — PROMETHAZINE-DM 6.25-15 MG/5ML PO SYRP: 5.0000 mL | ORAL_SOLUTION | Freq: Four times a day (QID) | ORAL | 1 refills | Status: DC | PRN

## 2022-10-09 MED ORDER — MOLNUPIRAVIR EUA 200MG CAPSULE: 4.0000 | ORAL_CAPSULE | Freq: Two times a day (BID) | ORAL | 0 refills | Status: AC

## 2022-10-09 MED ORDER — DEXAMETHASONE 1 MG PO TABS: ORAL_TABLET | ORAL | 0 refills | Status: DC

## 2022-10-09 NOTE — Patient Instructions (Signed)
Take tylenol PRN temp 101+ Push hydration Regular ambulation or calf exercises exercises for clot prevention and 81 mg ASA unless contraindicated Sx supportive therapy suggested Follow up via mychart or telephone if needed Advised patient obtain O2 monitor; present to ED if persistently <90% or with severe dyspnea, CP, fever uncontrolled by tylenol, confusion, sudden decline Should remain in isolation for 5 days from first day testing positive and then wear a mask anytime around other people for the following 5 days

## 2022-10-09 NOTE — Progress Notes (Signed)
THIS ENCOUNTER IS A VIRTUAL VISIT DUE TO COVID-19 - PATIENT WAS NOT SEEN IN THE OFFICE.  PATIENT HAS CONSENTED TO VIRTUAL VISIT / TELEMEDICINE VISIT   Virtual Visit via telephone Note  I connected with  [name] on 10/09/2022 by telephone.  I verified that I am speaking with the correct person using two identifiers.    I discussed the limitations of evaluation and management by telemedicine and the availability of in person appointments. The patient expressed understanding and agreed to proceed.  History of Present Illness:  There were no vitals taken for this visit. 61 y.o. patient contacted office reporting URI sx . he tested positive by home test today. OV was conducted by telephone to minimize exposure. This patient was vaccinated for covid 19, last 12/07/19 Immunization History  Administered Date(s) Administered   DTaP 08/25/2011   Influenza Inj Mdck Quad With Preservative 08/19/2017, 09/14/2018   Influenza Whole 08/29/2013   Moderna Sars-Covid-2 Vaccination 11/09/2019, 12/07/2019   Td 11/14/2020     Sx began 3 days ago with congestion, productive cough of white mucus, fatigue and chills  Treatments tried so far: mucinex and Nyquil  Exposures: unsure   Medications   Current Outpatient Medications (Cardiovascular):    metoprolol tartrate (LOPRESSOR) 25 MG tablet, Take 1/2 tablet twice daily (every 12 hours) for blood pressure and heart rhythm.   rosuvastatin (CRESTOR) 5 MG tablet, TAKE 1 TABLET ONCE A DAY TO CONTROL CHOLESTEROL.  Current Outpatient Medications (Respiratory):    albuterol (VENTOLIN HFA) 108 (90 Base) MCG/ACT inhaler, Inhale 2 puffs into the lungs every 4 (four) hours as needed for wheezing or shortness of breath.   Cetirizine HCl 10 MG CAPS, Take 1 tablet by mouth daily as needed. (Patient not taking: Reported on 10/09/2022)   diphenhydrAMINE HCl (ALLERGY MED PO), Take by mouth as needed. (Patient not taking: Reported on 10/09/2022)    fluticasone (FLONASE) 50 MCG/ACT nasal spray, Place 2 sprays into both nostrils as needed for allergies. (Patient not taking: Reported on 10/09/2022)   Current Outpatient Medications (Hematological):    Methylcobalamin (B-12) 5000 MCG TBDP, Take 1 tablet by mouth once a week.  Current Outpatient Medications (Other):    sucralfate (CARAFATE) 1 GM/10ML suspension, Take 10 mLs (1 g total) by mouth 4 (four) times daily for 10 days. (Patient not taking: Reported on 10/09/2022)   Cholecalciferol (VITAMIN D3) 125 MCG (5000 UT) TABS, Take 1 tablet (5,000 Units total) by mouth daily.   famotidine (PEPCID) 20 MG tablet, Take 1 tablet (20 mg total) by mouth 2 (two) times daily.   Magnesium 400 MG CAPS, Take 1 tablet by mouth daily.   pantoprazole (PROTONIX) 40 MG tablet, Take 1 tablet (40 mg total) by mouth daily. (Patient not taking: Reported on 10/09/2022)   vitamin C (ASCORBIC ACID) 500 MG tablet, Take 1 tablet (500 mg total) by mouth daily.   zinc gluconate 50 MG tablet, Take 50 mg by mouth daily.  Allergies:  Allergies  Allergen Reactions   Shellfish Allergy Nausea Only   Iodine Other (See Comments)    Unknown Unknown Allergic to shellfish - nausea    Problem list He has Obstructive sleep apnea; Hyperlipidemia; Seasonal allergies; Asthma; Essential hypertension; Morbid obesity ([place]) - BMI 30+ with OSA; History of nephrolithiasis; PVC's (premature ventricular contractions); Anorectal fissure; Diverticulosis; Vitamin D deficiency; and B12 deficiency on their problem list.   Social History:   reports that he has never smoked. He quit smokeless tobacco use about 43 years ago.  His smokeless tobacco use included chew. He reports that he does not currently use alcohol. He reports that he does not use drugs.  Observations/Objective:  General : Well sounding patient in no apparent distress HEENT: no hoarseness, no cough for duration of visit Lungs: speaks in complete sentences, no audible  wheezing, no apparent distress Neurological: alert, oriented x 3 Psychiatric: pleasant, judgement appropriate   Assessment and Plan:  Covid 19 Covid 19 positive per rapid screening test home test today Risk factors include: has Obstructive sleep apnea; Hyperlipidemia; Seasonal allergies; Asthma; Essential hypertension; Morbid obesity ([place]) - BMI 30+ with OSA; History of nephrolithiasis; PVC's (premature ventricular contractions); Anorectal fissure; Diverticulosis; Vitamin D deficiency; and B12 deficiency on their problem list.  Symptoms are: mild Due to co morbid conditions and risk factors, discussed antivirals  Immue support reviewed  Take tylenol PRN temp 101+ Push hydration Regular ambulation or calf exercises exercises for clot prevention and 81 mg ASA unless contraindicated Sx supportive therapy suggested Follow up via mychart or telephone if needed Advised patient obtain O2 monitor; present to ED if persistently <90% or with severe dyspnea, CP, fever uncontrolled by tylenol, confusion, sudden decline Should remain in isolation for 5 days from first day testing positive and then wear a mask anytime around other people for the following 5 days  Diagnoses and all orders for this visit:  COVID-19 -     dexamethasone (DECADRON) 1 MG tablet; Take 3 tabs for 3 days, 2 tabs for 3 days 1 tab for 5 days. Take with food. -     azithromycin (ZITHROMAX) 250 MG tablet; Take 2 tablets (500 mg) on  Day 1,  followed by 1 tablet (250 mg) once daily on Days 2 through 5. -     molnupiravir EUA (LAGEVRIO) 200 mg CAPS capsule; Take 4 capsules (800 mg total) by mouth 2 (two) times daily for 5 days. -     promethazine-dextromethorphan (PROMETHAZINE-DM) 6.25-15 MG/5ML syrup; Take 5 mLs by mouth 4 (four) times daily as needed for cough.     Follow Up Instructions:  I discussed the assessment and treatment plan with the patient. The patient was provided an opportunity to ask questions and all were  answered. The patient agreed with the plan and demonstrated an understanding of the instructions.   The patient was advised to call back or seek an in-person evaluation if the symptoms worsen or if the condition fails to improve as anticipated.  I provided 20 minutes of non-face-to-face time during this encounter.   Alycia Rossetti, NP

## 2022-10-09 NOTE — Progress Notes (Signed)
       C  o  v  i  d  (+)  Changed           to         a                Tele Visit

## 2022-11-20 ENCOUNTER — Encounter: Payer: Self-pay | Admitting: Nurse Practitioner

## 2022-11-20 ENCOUNTER — Ambulatory Visit (INDEPENDENT_AMBULATORY_CARE_PROVIDER_SITE_OTHER): Payer: 59 | Admitting: Nurse Practitioner

## 2022-11-20 VITALS — BP 150/84 | HR 77 | Temp 97.9°F | Ht 73.0 in | Wt 275.0 lb

## 2022-11-20 DIAGNOSIS — Z0000 Encounter for general adult medical examination without abnormal findings: Secondary | ICD-10-CM

## 2022-11-20 DIAGNOSIS — K5790 Diverticulosis of intestine, part unspecified, without perforation or abscess without bleeding: Secondary | ICD-10-CM

## 2022-11-20 DIAGNOSIS — E785 Hyperlipidemia, unspecified: Secondary | ICD-10-CM

## 2022-11-20 DIAGNOSIS — J45909 Unspecified asthma, uncomplicated: Secondary | ICD-10-CM

## 2022-11-20 DIAGNOSIS — I10 Essential (primary) hypertension: Secondary | ICD-10-CM

## 2022-11-20 DIAGNOSIS — E559 Vitamin D deficiency, unspecified: Secondary | ICD-10-CM

## 2022-11-20 DIAGNOSIS — Z125 Encounter for screening for malignant neoplasm of prostate: Secondary | ICD-10-CM

## 2022-11-20 DIAGNOSIS — Z1389 Encounter for screening for other disorder: Secondary | ICD-10-CM

## 2022-11-20 DIAGNOSIS — G4733 Obstructive sleep apnea (adult) (pediatric): Secondary | ICD-10-CM

## 2022-11-20 DIAGNOSIS — Z79899 Other long term (current) drug therapy: Secondary | ICD-10-CM

## 2022-11-20 DIAGNOSIS — R7309 Other abnormal glucose: Secondary | ICD-10-CM

## 2022-11-20 DIAGNOSIS — Z0001 Encounter for general adult medical examination with abnormal findings: Secondary | ICD-10-CM

## 2022-11-20 DIAGNOSIS — J302 Other seasonal allergic rhinitis: Secondary | ICD-10-CM

## 2022-11-20 NOTE — Patient Instructions (Signed)

## 2022-11-20 NOTE — Progress Notes (Signed)
Complete Physical  Assessment and Plan:  Encounter for Annual Physical Exam with abnormal findings Due annually  Health Maintenance reviewed Healthy lifestyle reviewed and goals set  Asthma, unspecified asthma severity, uncomplicated No recent flares. Continue Albuterol PRN  Hyperlipidemia Discussed lifestyle modifications. Recommended diet heavy in fruits and veggies, omega 3's. Decrease consumption of animal meats, cheeses, and dairy products. Remain active and exercise as tolerated. Continue to monitor. Check lipids/TSH   Obstructive sleep apnea Weight loss advised Has f/u 12/2022 Continue BiPAP  Seasonal allergies Continue medications Avoid triggers   Essential hypertension Discussed DASH (Dietary Approaches to Stop Hypertension) DASH diet is lower in sodium than a typical American diet. Cut back on foods that are high in saturated fat, cholesterol, and trans fats. Eat more whole-grain foods, fish, poultry, and nuts Remain active and exercise as tolerated daily.  Monitor BP at home-Call if greater than 130/80.  Check CMP/CBC   Morbid Obesity - BMI 30+ with OSA Discussed appropriate BMI Goal of losing 1 lb per month. Diet modification. Physical activity. Encouraged/praised to build confidence.   Abnormal Glucose Education: Reviewed 'ABCs' of diabetes management  Discussed goals to be met and/or maintained include A1C (<7) Blood pressure (<130/80) Cholesterol (LDL <70) Continue Eye Exam yearly  Continue Dental Exam Q6 mo Discussed dietary recommendations Discussed Physical Activity recommendations Check A1C  Vitamin D deficiency Continue supplement Monitor levels  Diverticulosis Last flare 08/2022 Currently well managed through diet and medication Continue to monitor  Medication management All medications discussed and reviewed in full. All questions and concerns regarding medications addressed.    Screening for prostate cancer Monitor PSA  levels  Orders Placed This Encounter  Procedures   CBC with Differential/Platelet   COMPLETE METABOLIC PANEL WITH GFR   Lipid panel   TSH   Hemoglobin A1c   Insulin, random   VITAMIN D 25 Hydroxy (Vit-D Deficiency, Fractures)   Urinalysis, Routine w reflex microscopic   Microalbumin / creatinine urine ratio   PSA    Notify office for further evaluation and treatment, questions or concerns if any reported s/s fail to improve.   The patient was advised to call back or seek an in-person evaluation if any symptoms worsen or if the condition fails to improve as anticipated.   Further disposition pending results of labs. Discussed med's effects and SE's.    I discussed the assessment and treatment plan with the patient. The patient was provided an opportunity to ask questions and all were answered. The patient agreed with the plan and demonstrated an understanding of the instructions.  Discussed med's effects and SE's. Screening labs and tests as requested with regular follow-up as recommended.  I provided 35 minutes of face-to-face time during this encounter including counseling, chart review, and critical decision making was preformed.  Future Appointments  Date Time Provider [place]  12/24/2022  9:40 AM Sueanne Margarita, MD [facility]  12/24/2022 10:20 AM Nahser, Wonda Cheng, MD [facility]  11/23/2023 10:00 AM Darrol Jump, NP [facility] None    HPI 62 y.o. male patient presents for a complete physical. He has Obstructive sleep apnea; Hyperlipidemia; Seasonal allergies; Asthma; Essential hypertension; Morbid obesity ([place]) - BMI 30+ with OSA; History of nephrolithiasis; PVC's (premature ventricular contractions); Anorectal fissure; Diverticulosis; Vitamin D deficiency; and B12 deficiency on their problem list.  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   Recently recovered from  [facility] 10/09/22.  Had minimal  symptoms.  Denies any residual or linger effects.    Had diverticulitis flare 09/23/22 with presentation to ED.  Has now been treated with Carafate which works well for him.    He has asthma/allergies and is on allegra as needed, and has albuterol but rarely needs with bronchitis.   BMI is Body mass index is 36.28 kg/m., he is working on diet and exercise. Wt Readings from Last 3 Encounters:  11/20/22 275 lb (124.7 kg)  09/24/22 279 lb (126.6 kg)  09/23/22 275 lb (124.7 kg)    Has sleep apnea, on biPAP optimal pressure 17/13, wears nightly, wears nasal pillows. States it helps with daytime energy and feels much better.  Has upcoming follow up in 12/2022.  He follows with Dr. Acie Fredrickson for palpitations/PVCs. Echo Oct 2020 showed normal EF 65-70%.  He has grade 1 diastolic dysfunction.  There is very minimal ascending aortic dilatation.  Normal pulmonary artery pressures.    His blood pressure has been controlled at home, today their BP isBP: (!) 150/84 He does workout. He denies chest pain, shortness of breath, dizziness.   He is on cholesterol medication, crestor 5 mg and denies myalgias. His cholesterol is at goal. The cholesterol last visit was:   Lab Results  Component Value Date   CHOL 173 05/07/2022   HDL 43 05/07/2022   LDLCALC 110 (H) 05/07/2022   TRIG 97 05/07/2022   CHOLHDL 4.0 05/07/2022   Last A1C in the office was:  Lab Results  Component Value Date   HGBA1C 5.5 07/18/2021   Patient is on Vitamin D supplement- has been on treatment.   Lab Results  Component Value Date   VD25OH 90 07/18/2021     Denies LUTS. Last PSA was: Lab Results  Component Value Date   PSA 1.00 07/18/2021    He is on B12 supplement, unsure of dose.  Lab Results  Component Value Date   [id number] 631 05/07/2022     Current Medications:   Current Outpatient Medications (Endocrine & Metabolic):    dexamethasone (DECADRON) 1 MG tablet, Take 3 tabs for 3  days, 2 tabs for 3 days 1 tab for 5 days. Take with food. (Patient not taking: Reported on 11/20/2022)  Current Outpatient Medications (Cardiovascular):    metoprolol tartrate (LOPRESSOR) 25 MG tablet, Take 1/2 tablet twice daily (every 12 hours) for blood pressure and heart rhythm.   rosuvastatin (CRESTOR) 5 MG tablet, TAKE 1 TABLET ONCE A DAY TO CONTROL CHOLESTEROL.  Current Outpatient Medications (Respiratory):    albuterol (VENTOLIN HFA) 108 (90 Base) MCG/ACT inhaler, Inhale 2 puffs into the lungs every 4 (four) hours as needed for wheezing or shortness of breath. (Patient not taking: Reported on 11/20/2022)   Cetirizine HCl 10 MG CAPS, Take 1 tablet by mouth daily as needed. (Patient not taking: Reported on 10/09/2022)   diphenhydrAMINE HCl (ALLERGY MED PO), Take by mouth as needed. (Patient not taking: Reported on 10/09/2022)   fluticasone (FLONASE) 50 MCG/ACT nasal spray, Place 2 sprays into both nostrils as needed for allergies. (Patient not taking: Reported on 10/09/2022)   promethazine-dextromethorphan (PROMETHAZINE-DM) 6.25-15 MG/5ML syrup, Take 5 mLs by mouth 4 (four) times daily as needed for cough. (Patient not taking: Reported on 11/20/2022)   Current Outpatient Medications (Hematological):    Methylcobalamin (B-12) 5000 MCG TBDP, Take 1 tablet by mouth once a week.  Current Outpatient Medications (Other):    Cholecalciferol (VITAMIN D3) 125 MCG (5000 UT) TABS, Take 1 tablet (5,000 Units total) by mouth  daily.   Magnesium 400 MG CAPS, Take 1 tablet by mouth daily.   sucralfate (CARAFATE) 1 GM/10ML suspension, Take 10 mLs (1 g total) by mouth 4 (four) times daily for 10 days. (Patient not taking: Reported on 10/09/2022)   vitamin C (ASCORBIC ACID) 500 MG tablet, Take 1 tablet (500 mg total) by mouth daily.   zinc gluconate 50 MG tablet, Take 50 mg by mouth daily.   azithromycin (ZITHROMAX) 250 MG tablet, Take 2 tablets (500 mg) on  Day 1,  followed by 1 tablet (250 mg) once daily on  Days 2 through 5. (Patient not taking: Reported on 11/20/2022)   famotidine (PEPCID) 20 MG tablet, Take 1 tablet (20 mg total) by mouth 2 (two) times daily.   pantoprazole (PROTONIX) 40 MG tablet, Take 1 tablet (40 mg total) by mouth daily. (Patient not taking: Reported on 10/09/2022)  Health Maintenance:  Immunization History  Administered Date(s) Administered   DTaP 08/25/2011   Influenza Inj Mdck Quad With Preservative 08/19/2017, 09/14/2018   Influenza Whole 08/29/2013   Influenza-Unspecified 08/21/2022   Moderna Sars-Covid-2 Vaccination 11/09/2019, 12/07/2019   Td 11/14/2020   Tetanus: 11/2020 Pneumovax: N/A Prevnar 13: N/A Flu vaccine: gets annually through county Shingrix: declines Covid 19: 2/2, moderna, 2021 declines booster   Colonoscopy: 08/2016 due 10 years  Last eye: remote, encouraged to schedule Last dental: Last 2023, q55mLast derm: LLyndle Herrlich last 2023  Patient Care Team: MUnk Pinto MD as PCP - General (Internal Medicine) Nahser, PWonda Cheng MD as PCP - Cardiology (Cardiology) TSueanne Margarita MD as PCP - Sleep Medicine (Cardiology) LDruscilla Brownie MD as Consulting Physician (Dermatology) MRichmond Campbell MD as Consulting Physician (Gastroenterology) HMonna Fam MD as Consulting Physician (Ophthalmology)   Medical History:  Past Medical History:  Diagnosis Date   Allergy    Asthma    Diverticulitis    Diverticulitis 09/17/2015   Essential hypertension    Hemorrhoid    Hyperlipidemia    Kidney stones    Mild dilation of ascending aorta ([facility]    Obesity    Obstructive sleep apnea 10/16/2010   Qualifier: Diagnosis of  By: CGwenette GreetMD, KArmando Reichert Not on CPAP     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    Seasonal allergies 08/24/2013   Swollen lymph nodes    abd, groin,    Allergies Allergies  Allergen Reactions   Shellfish Allergy Nausea  Only   Iodine Other (See Comments)    Unknown Unknown Allergic to shellfish - nausea    SURGICAL HISTORY He  has a past surgical history that includes Vasectomy (MID 90S). FAMILY HISTORY His family history includes Atrial fibrillation in his mother; Cancer in his brother and paternal grandmother; Diabetes in his father; Heart disease in his father; Heart failure in his father; Leukemia in his maternal grandmother; Stroke in his mother. SOCIAL HISTORY He  reports that he has never smoked. He quit smokeless tobacco use about 44 years ago.  His smokeless tobacco use included chew. He reports that he does not currently use alcohol. He reports that he does not use drugs.  Review of Systems  Constitutional: Negative.  Negative for malaise/fatigue and weight loss.  HENT: Negative.  Negative for hearing loss and tinnitus.   Eyes: Negative.  Negative for blurred vision and double vision.  Respiratory: Negative.  Negative for cough, shortness of breath and wheezing.   Cardiovascular: Negative.  Negative for chest pain, palpitations, orthopnea, claudication and leg swelling.  Gastrointestinal: Negative.  Negative for abdominal pain, blood in stool, constipation, diarrhea, heartburn, melena, nausea and vomiting.  Genitourinary: Negative.   Musculoskeletal: Negative.  Negative for joint pain and myalgias.  Skin: Negative.  Negative for rash.  Neurological: Negative.  Negative for dizziness, tingling, sensory change, weakness and headaches.  Endo/Heme/Allergies:  Negative for polydipsia.  Psychiatric/Behavioral: Negative.    All other systems reviewed and are negative.   Physical Exam: Estimated body mass index is 36.28 kg/m as calculated from the following:   Height as of this encounter: '6\' 1"'$  (1.854 m).   Weight as of this encounter: 275 lb (124.7 kg). BP (!) 150/84   Pulse 77   Temp 97.9 F (36.6 C)   Ht '6\' 1"'$  (1.854 m)   Wt 275 lb (124.7 kg)   SpO2 98%   BMI 36.28 kg/m  General  Appearance: Well nourished, in no apparent distress.  Eyes: PERRLA, EOMs, conjunctiva no swelling or erythema Sinuses: No Frontal/maxillary tenderness  ENT/Mouth: Ext aud canals clear, normal light reflex with TMs without erythema, bulging. Good dentition. No erythema, swelling, or exudate on post pharynx. Tonsils not swollen or erythematous. Hearing normal.  Neck: Supple, thyroid normal. No bruits  Respiratory: Respiratory effort normal, BS equal bilaterally without rales, rhonchi, wheezing or stridor.  Cardio: RRR without murmurs, rubs or gallops. Brisk peripheral pulses without edema.  Chest: symmetric, with normal excursions and percussion.  Abdomen: Soft, nontender, obese,  no guarding, rebound, hernias, masses, or organomegaly.  Lymphatics: Non tender without lymphadenopathy.  Genitourinary: declines, doing self checks, no concerns Musculoskeletal: Full ROM all peripheral extremities,5/5 strength, and normal gait.  Skin: Warm, dry without rashes, lesions, ecchymosis. Neuro: Cranial nerves intact, reflexes equal bilaterally. Normal muscle tone, no cerebellar symptoms. Sensation intact.  Psych: Awake and oriented X 3, normal affect, Insight and Judgment appropriate.   EKG: 01/2022 NSR Reviewed  Channon Brougher 10:59 AM [facility]

## 2022-11-21 LAB — CBC WITH DIFFERENTIAL/PLATELET
Absolute Monocytes: 684 cells/uL (ref 200–950)
Basophils Absolute: 61 cells/uL (ref 0–200)
Basophils Relative: 0.8 %
Eosinophils Absolute: 182 cells/uL (ref 15–500)
Eosinophils Relative: 2.4 %
HCT: 47.4 % (ref 38.5–50.0)
Hemoglobin: 16.6 g/dL (ref 13.2–17.1)
Lymphs Abs: 1414 cells/uL (ref 850–3900)
MCH: 31 pg (ref 27.0–33.0)
MCHC: 35 g/dL (ref 32.0–36.0)
MCV: 88.6 fL (ref 80.0–100.0)
MPV: 9.7 fL (ref 7.5–12.5)
Monocytes Relative: 9 %
Neutro Abs: 5259 cells/uL (ref 1500–7800)
Neutrophils Relative %: 69.2 %
Platelets: 272 10*3/uL (ref 140–400)
RBC: 5.35 10*6/uL (ref 4.20–5.80)
RDW: 13.2 % (ref 11.0–15.0)
Total Lymphocyte: 18.6 %
WBC: 7.6 10*3/uL (ref 3.8–10.8)

## 2022-11-21 LAB — COMPLETE METABOLIC PANEL WITH GFR
AG Ratio: 1.8 (calc) (ref 1.0–2.5)
ALT: 38 U/L (ref 9–46)
AST: 25 U/L (ref 10–35)
Albumin: 4.6 g/dL (ref 3.6–5.1)
Alkaline phosphatase (APISO): 71 U/L (ref 35–144)
BUN: 12 mg/dL (ref 7–25)
CO2: 27 mmol/L (ref 20–32)
Calcium: 9.7 mg/dL (ref 8.6–10.3)
Chloride: 104 mmol/L (ref 98–110)
Creat: 0.9 mg/dL (ref 0.70–1.35)
Globulin: 2.6 g/dL (calc) (ref 1.9–3.7)
Glucose, Bld: 109 mg/dL — ABNORMAL HIGH (ref 65–99)
Potassium: 4.4 mmol/L (ref 3.5–5.3)
Sodium: 141 mmol/L (ref 135–146)
Total Bilirubin: 1.2 mg/dL (ref 0.2–1.2)
Total Protein: 7.2 g/dL (ref 6.1–8.1)
eGFR: 97 mL/min/{1.73_m2} (ref >=60)

## 2022-11-21 LAB — LIPID PANEL
Cholesterol: 168 mg/dL (ref <200)
HDL: 52 mg/dL (ref >=40)
LDL Cholesterol (Calc): 100 mg/dL (calc) — ABNORMAL HIGH
Non-HDL Cholesterol (Calc): 116 mg/dL (calc) (ref <130)
Total CHOL/HDL Ratio: 3.2 (calc) (ref <5.0)
Triglycerides: 74 mg/dL (ref <150)

## 2022-11-21 LAB — PSA: PSA: 1.19 ng/mL (ref <=4.00)

## 2022-11-21 LAB — URINALYSIS, ROUTINE W REFLEX MICROSCOPIC
Bilirubin Urine: NEGATIVE
Glucose, UA: NEGATIVE
Hgb urine dipstick: NEGATIVE
Ketones, ur: NEGATIVE
Leukocytes,Ua: NEGATIVE
Nitrite: NEGATIVE
Protein, ur: NEGATIVE
Specific Gravity, Urine: 1.023 (ref 1.001–1.035)
pH: 6 (ref 5.0–8.0)

## 2022-11-21 LAB — INSULIN, RANDOM: Insulin: 22.2 u[IU]/mL — ABNORMAL HIGH

## 2022-11-21 LAB — MICROALBUMIN / CREATININE URINE RATIO
Creatinine, Urine: 169 mg/dL (ref 20–320)
Microalb Creat Ratio: 3 mcg/mg creat (ref <30)
Microalb, Ur: 0.5 mg/dL

## 2022-11-21 LAB — TSH: TSH: 2.11 mIU/L (ref 0.40–4.50)

## 2022-11-21 LAB — HEMOGLOBIN A1C
Hgb A1c MFr Bld: 5.9 % of total Hgb — ABNORMAL HIGH (ref <5.7)
Mean Plasma Glucose: 123 mg/dL
eAG (mmol/L): 6.8 mmol/L

## 2022-11-21 LAB — VITAMIN D 25 HYDROXY (VIT D DEFICIENCY, FRACTURES): Vit D, 25-Hydroxy: 59 ng/mL (ref 30–100)

## 2022-12-21 ENCOUNTER — Encounter: Payer: Self-pay | Admitting: Cardiovascular Disease

## 2022-12-21 NOTE — Progress Notes (Unsigned)
Cardiology Office Note    Date:  12/24/2022  ID:  [name], DOB 02-22-1961, MRN [id number] PCP:  [name], MD  Cardiologist:  New, Dr. Acie Fredrickson    Chief Complaint: palpitations       Previous notes per [name], PA   LEDION Fuller  is a 62 y.o. male with history of asthma, HTN, HLD, obesity, suspected OSA, seasonal allergies, diverticulitis who is being seen today for the evaluation of palpitations at the request of Dr. Rogene Houston (EDP).  He has no prior cardiac history. He was previously told he had suspected sleep apnea due to an overnight pulse ox test in the past but never had a formal sleep study due to some technical issues with scheduling with pulmonology. In June of this year he noticed sensation of palpitations following a temporary increase in caffeine intake (6 drinks per day). He quit caffeine cold [place] and these resolved. In early September he noticed about 3 days of recurrent palpitations described as skips/flips that felt like a fullness in his throat. This began shortly after he had worked outside in the extreme heat - he thinks he had lost a lot of fluid in sweat. Due to persistent symptoms he was seen in the ED 07/19/17 where he was found to have rare PVCs. Troponin was negative. CBC wnl, BMET showed K 4.7, Cr 0.93, glucose 116. Remote MG/TSH 01/2017 wnl and LDL 146. He was advised to establish care with cardiology. He continues to have occasional palpitations most days of the week. He does not believe he's had any recent tachypalpitations. He does snore. He's been actively trying to lose weight -30lb over the last year. He wants to be healthier to be around for his 2.5-yea-old grandson. He also walks daily and rides his bike every other day. He's not had any recent chest pain, dyspnea, LEE, syncope. Palpitations improve with physical exercise.  Nov. 6, 2018:  Doing better  Still having palpitations Worse with caffiene. Is under lots of stress .    Echocardiogram from October 5 shows normal left ventricular systolic function with EF 65-70%.  He has grade 1 diastolic dysfunction.  There is very minimal ascending aortic dilatation.  Normal pulmonary artery pressures. Tries to watch his salt .   Occasional hot dog.  Has lost 32 lbs over the past year .   Walks , rides his bike   Nov. 6, 2019:  Doing well Had some PVCs Tried verapamil in place of the metoprolol  Checked his electrolytes.  OSA is well controlled.  Wt was 256 lbs   Mood seems to be OK on Celexa 20 mg a day  Exercising regularly .   Joined [employer] - goes 4 times a week  No significant arrhythmias  After workouts  Has been eating salads with chicken for the past several months   Dec. 9, 2020    Will be seen today for follow-up of his premature ventricular contractions and hypertension. Wt is 276 lbs ( (+ 20 lbs from last year)  Is walking 4 miles a day .  Admits to eating too much  Palpitations are well controlled  Feb. 1, 2022  [name] is seen today for follow up of his PVCs and HTN Wt today is  274 lbs  Has OSA - sees Dr. Radford Pax  Has some lightheadedness after he takes his morning metoprolol and Calan SR 120 mg  His primary MD gave his the Calan for PVCs( the metoprolol was not controlling )  Trying to walk 10,000 steps a day  Is starting to work   January 13, 2022 [name] is seen for follow up of his HTN and PVCs Has OSA - sees Dr. 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 14, 2024  David Fuller is seen for follow up of his HTN, PVC OSA - sees Dr. Prentice Docker is 279 lbs  Weight has waxed and waned ,   Working out on the tread climber  Working on a better diet .  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    Seasonal allergies 08/24/2013   Swollen lymph nodes    abd, groin,     Past Surgical History:  Procedure Laterality Date   VASECTOMY  MID 90S    Current Medications: Current Meds  Medication Sig   Cetirizine HCl 10 MG CAPS Take 1 tablet by mouth daily as needed.   Cholecalciferol (VITAMIN D3) 125 MCG (5000 UT) TABS Take 1 tablet (5,000 Units total) by mouth daily.   fluticasone (FLONASE) 50 MCG/ACT nasal spray Place 2 sprays into both nostrils as needed for allergies.   Magnesium 400 MG CAPS Take 1 tablet by mouth daily.   Methylcobalamin (B-12) 5000 MCG TBDP Take 1 tablet by mouth once a week.   metoprolol tartrate (LOPRESSOR) 25 MG tablet Take 1/2 tablet twice daily (every 12 hours) for blood pressure and heart rhythm.   rosuvastatin (CRESTOR) 5 MG tablet TAKE 1 TABLET ONCE A DAY TO CONTROL CHOLESTEROL.   vitamin C (ASCORBIC ACID) 500 MG tablet Take 1 tablet (500 mg total) by mouth daily.   zinc gluconate 50 MG tablet Take 50 mg by mouth daily.     Allergies:   Shellfish allergy and Iodine   Social History   Socioeconomic History   Marital status: Married    Spouse name: Not on file   Number of children: Not on file   Years of education: Not on file   Highest education level: Not on file  Occupational History   Not on file  Tobacco Use   Smoking status: Never   Smokeless tobacco: Former    Types: Chew    Quit date: 1980  Vaping Use   Vaping Use: Never used  Substance and Sexual Activity   Alcohol use: Not Currently    Alcohol/week: 0.0 - 2.0 standard drinks of alcohol    Comment: 2OZ A WEEK   Drug use: No   Sexual activity: Yes     Partners: Female    Comment: vasectomy  Other Topics Concern   Not on file  Social History Narrative   Not on file   Social Determinants of Health   Financial Resource Strain: Not on file  Food Insecurity: Not on file  Transportation Needs: Not on file  Physical Activity: Not  on file  Stress: Not on file  Social Connections: Not on file     Family History:  Family History  Problem Relation Age of Onset   Atrial fibrillation Mother    Stroke Mother    Diabetes Father    Heart disease Father    Heart failure Father    Cancer Brother        Melanoma- left arm   Leukemia Maternal Grandmother    Cancer Paternal Grandmother     ROS:   Please see the history of present illness.  All other systems are reviewed and otherwise negative.   Physical Exam: Blood pressure 122/78, pulse 74, height 6' 1"$  (1.854 m), weight 279 lb 9.6 oz (126.8 kg), SpO2 95 %.       GEN:  Well nourished, well developed in no acute distress HEENT: Normal NECK: No JVD; No carotid bruits LYMPHATICS: No lymphadenopathy CARDIAC: RRR , no murmurs, rubs, gallops RESPIRATORY:  Clear to auscultation without rales, wheezing or rhonchi  ABDOMEN: Soft, non-tender, non-distended MUSCULOSKELETAL:  No edema; No deformity  SKIN: Warm and dry NEUROLOGIC:  Alert and oriented x 3    Wt Readings from Last 3 Encounters:  12/24/22 279 lb 9.6 oz (126.8 kg)  12/24/22 279 lb 9.6 oz (126.8 kg)  11/20/22 275 lb (124.7 kg)      Studies/Labs Reviewed:   EKG: December 24, 2022: Sinus rhythm at 69.  First-degree AV block.  Incomplete right bundle branch block.  Recent Labs: 11/20/2022: ALT 38; BUN 12; Creat 0.90; Hemoglobin 16.6; Platelets 272; Potassium 4.4; Sodium 141; TSH 2.11   Lipid Panel    Component Value Date/Time   CHOL 168 11/20/2022 0000   CHOL 165 12/16/2018 0931   TRIG 74 11/20/2022 0000   HDL 52 11/20/2022 0000   HDL 50 12/16/2018 0931   CHOLHDL 3.2 11/20/2022 0000   VLDL 18 01/13/2017 1445    LDLCALC 100 (H) 11/20/2022 0000    Additional studies/ records that were reviewed today include: Summarized above.   ASSESSMENT & PLAN:    1.  Palpitations -he is doing well.  He is not having any episodes of     2.  Essential HTN -   Blood pressure is well-controlled.   3.  Hyperlipidemia -lipids looked okay.  Continue with weight loss efforts.   4.   OSA-follow-up with Dr. Radford Pax         Medication Adjustments/Labs and Tests Ordered: Current medicines are reviewed at length with the patient today.  Concerns regarding medicines are outlined above. Medication changes, Labs and Tests ordered today are summarized above and listed in the Patient Instructions accessible in Encounters.   Signed, Mertie Moores, MD  12/24/2022 10:02 AM    [facility] [address], [place], [place]  [address] Phone: [phone]; Fax: [phone]

## 2022-12-24 ENCOUNTER — Ambulatory Visit (INDEPENDENT_AMBULATORY_CARE_PROVIDER_SITE_OTHER): Payer: 59 | Admitting: Cardiovascular Disease

## 2022-12-24 ENCOUNTER — Ambulatory Visit: Payer: 59 | Attending: Cardiology | Admitting: Cardiology

## 2022-12-24 ENCOUNTER — Encounter: Payer: Self-pay | Admitting: Cardiovascular Disease

## 2022-12-24 ENCOUNTER — Encounter: Payer: Self-pay | Admitting: Cardiology

## 2022-12-24 VITALS — BP 122/78 | HR 74 | Ht 73.0 in | Wt 279.6 lb

## 2022-12-24 DIAGNOSIS — I493 Ventricular premature depolarization: Secondary | ICD-10-CM | POA: Diagnosis not present

## 2022-12-24 DIAGNOSIS — I10 Essential (primary) hypertension: Secondary | ICD-10-CM

## 2022-12-24 DIAGNOSIS — G4733 Obstructive sleep apnea (adult) (pediatric): Secondary | ICD-10-CM | POA: Diagnosis not present

## 2022-12-24 NOTE — Patient Instructions (Signed)
Medication Instructions:  Your physician recommends that you continue on your current medications as directed. Please refer to the Current Medication list given to you today.  *If you need a refill on your cardiac medications before your next appointment, please call your pharmacy*   Lab Work: NONE If you have labs (blood work) drawn today and your tests are completely normal, you will receive your results only by: [place] (if you have MyChart) OR A paper copy in the mail If you have any lab test that is abnormal or we need to change your treatment, we will call you to review the results.   Testing/Procedures: NONE   Follow-Up: At [facility], you and your health needs are our priority.  As part of our continuing mission to provide you with exceptional heart care, we have created designated Provider Care Teams.  These Care Teams include your primary Cardiologist (physician) and Advanced Practice Providers (APPs -  Physician Assistants and Nurse Practitioners) who all work together to provide you with the care you need, when you need it.  We recommend signing up for the patient portal called "MyChart".  Sign up information is provided on this After Visit Summary.  MyChart is used to connect with patients for Virtual Visits (Telemedicine).  Patients are able to view lab/test results, encounter notes, upcoming appointments, etc.  Non-urgent messages can be sent to your provider as well.   To learn more about what you can do with MyChart, go to [email or web address].    Your next appointment:   1 year(s)  Provider:   Mertie Moores, MD

## 2022-12-24 NOTE — Patient Instructions (Signed)
Medication Instructions:  Your physician recommends that you continue on your current medications as directed. Please refer to the Current Medication list given to you today.  *If you need a refill on your cardiac medications before your next appointment, please call your pharmacy*   Lab Work: None.  If you have labs (blood work) drawn today and your tests are completely normal, you will receive your results only by: [place] (if you have MyChart) OR A paper copy in the mail If you have any lab test that is abnormal or we need to change your treatment, we will call you to review the results.   Testing/Procedures: None.   Follow-Up: At [facility], you and your health needs are our priority.  As part of our continuing mission to provide you with exceptional heart care, we have created designated Provider Care Teams.  These Care Teams include your primary Cardiologist (physician) and Advanced Practice Providers (APPs -  Physician Assistants and Nurse Practitioners) who all work together to provide you with the care you need, when you need it.  We recommend signing up for the patient portal called "MyChart".  Sign up information is provided on this After Visit Summary.  MyChart is used to connect with patients for Virtual Visits (Telemedicine).  Patients are able to view lab/test results, encounter notes, upcoming appointments, etc.  Non-urgent messages can be sent to your provider as well.   To learn more about what you can do with MyChart, go to [email or web address].    Your next appointment:   1 year(s)  Provider:   Dr. Fransico Him, MD

## 2022-12-24 NOTE — Progress Notes (Signed)
Date:  12/24/2022   ID:  David Fuller, DOB 20-Jul-1961, MRN [id number]  PCP:  [name], MD  Cardiologist:  Mertie Moores, MD  Sleep Medicine:  Fransico Him, MD Electrophysiologist:  None   Chief Complaint:  OSA  History of Present Illness:    David Fuller is a 62 y.o. male  with a hx of PSG showed moderate obstructive sleep apnea with an AHI of 23/h mild oxygen saturations as low as 86%.  Due to ongoing respiratory events he was not able to be titrated adequately with CPAP and underwent BiPAP titration to 17/13 cm H2O.    He is doing well with his PAP device and thinks that he has gotten used to it.  He tolerates the nasal pillow mask and feels the pressure is adequate.  Since going on PAP he feels rested in the am and has no significant daytime sleepiness.  Recently he has been waking up in the night with a dry mouth.  It is not all the time.   He does not think that he snores.  He has no significant nasal dryness or nasal congestion.    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    Seasonal allergies 08/24/2013   Swollen lymph nodes    abd, groin,    Past Surgical History:  Procedure Laterality Date   VASECTOMY  MID 90S     Current Meds  Medication Sig   Cetirizine HCl 10 MG CAPS Take 1 tablet by mouth daily as needed.   fluticasone (FLONASE) 50 MCG/ACT nasal spray Place 2 sprays into both nostrils as needed for allergies.    Magnesium 400 MG CAPS Take 1 tablet by mouth daily.   Methylcobalamin (B-12) 5000 MCG TBDP Take 1 tablet by mouth once a week.   metoprolol tartrate (LOPRESSOR) 25 MG tablet Take 1/2 tablet twice daily (every 12 hours) for blood pressure and heart rhythm.   rosuvastatin (CRESTOR) 5 MG tablet TAKE 1 TABLET ONCE A DAY TO CONTROL CHOLESTEROL.   vitamin C (ASCORBIC ACID) 500 MG tablet Take 1 tablet (500 mg total) by mouth daily.   zinc gluconate 50 MG tablet Take 50 mg by mouth daily.     Allergies:   Shellfish allergy and Iodine   Social History   Tobacco Use   Smoking status: Never   Smokeless tobacco: Former    Types: Chew    Quit date: 1980  Vaping Use   Vaping Use: Never used  Substance Use Topics   Alcohol use: Not Currently    Alcohol/week: 0.0 - 2.0 standard drinks of alcohol    Comment: 2OZ A WEEK   Drug use: No     Family Hx: The patient's family history includes Atrial fibrillation in his mother; Cancer in his brother and paternal grandmother; Diabetes in his father; Heart disease in his father; Heart failure  in his father; Leukemia in his maternal grandmother; Stroke in his mother.  ROS:   Please see the history of present illness.     All other systems reviewed and are negative.   Labs/Other Tests and Data Reviewed:    Recent Labs: 11/20/2022: ALT 38; BUN 12; Creat 0.90; Hemoglobin 16.6; Platelets 272; Potassium 4.4; Sodium 141; TSH 2.11   Recent Lipid Panel Lab Results  Component Value Date/Time   CHOL 168 11/20/2022 12:00 AM   CHOL 165 12/16/2018 09:31 AM   TRIG 74 11/20/2022 12:00 AM   HDL 52 11/20/2022 12:00 AM   HDL 50 12/16/2018 09:31 AM   CHOLHDL 3.2 11/20/2022 12:00 AM   LDLCALC 100 (H) 11/20/2022 12:00 AM    Wt Readings from Last 3 Encounters:  12/24/22 279 lb 9.6 oz (126.8 kg)  11/20/22 275 lb (124.7 kg)  09/24/22 279 lb (126.6 kg)     Objective:    Vital Signs:  BP 122/78   Pulse 74   Ht 6' 1"$  (1.854 m)   Wt 279 lb 9.6 oz (126.8 kg)    SpO2 95%   BMI 36.89 kg/m   GEN: Well nourished, well developed in no acute distress HEENT: Normal NECK: No JVD; No carotid bruits LYMPHATICS: No lymphadenopathy CARDIAC:RRR, no murmurs, rubs, gallops RESPIRATORY:  Clear to auscultation without rales, wheezing or rhonchi  ABDOMEN: Soft, non-tender, non-distended MUSCULOSKELETAL:  No edema; No deformity  SKIN: Warm and dry NEUROLOGIC:  Alert and oriented x 3 PSYCHIATRIC:  Normal affect  ASSESSMENT & PLAN:    1.  OSA - The patient is tolerating PAP therapy well without any problems. The PAP download performed by his DME was personally reviewed and interpreted by me today and showed an AHI of 1.5/hr on auto BiPAP  with 100% compliance in using more than 4 hours nightly.  The patient has been using and benefiting from PAP use and will continue to benefit from therapy.   2.  HTN -BP controlled -continue prescription drug management with Lopressor 12.57m BID with PRn refills  Medication Adjustments/Labs and Tests Ordered: Current medicines are reviewed at length with the patient today.  Concerns regarding medicines are outlined above.  Tests Ordered: No orders of the defined types were placed in this encounter.   Medication Changes: No orders of the defined types were placed in this encounter.    Disposition:  Follow up in 1 year(s)  Signed, TFransico Him MD  12/24/2022 9:40 AM    [facility]

## 2023-03-17 ENCOUNTER — Ambulatory Visit: Payer: 59 | Admitting: Nurse Practitioner

## 2023-03-17 ENCOUNTER — Encounter: Payer: Self-pay | Admitting: Nurse Practitioner

## 2023-03-17 VITALS — BP 122/82 | HR 75 | Temp 97.8°F | Ht 73.0 in | Wt 283.4 lb

## 2023-03-17 DIAGNOSIS — R058 Other specified cough: Secondary | ICD-10-CM

## 2023-03-17 DIAGNOSIS — J029 Acute pharyngitis, unspecified: Secondary | ICD-10-CM

## 2023-03-17 DIAGNOSIS — J0100 Acute maxillary sinusitis, unspecified: Secondary | ICD-10-CM

## 2023-03-17 DIAGNOSIS — J45909 Unspecified asthma, uncomplicated: Secondary | ICD-10-CM | POA: Diagnosis not present

## 2023-03-17 MED ORDER — DEXAMETHASONE SODIUM PHOSPHATE 10 MG/ML IJ SOLN
10.0000 mg | Freq: Once | INTRAMUSCULAR | Status: AC
  Administered 2023-03-17: 10 mg via INTRAMUSCULAR

## 2023-03-17 MED ORDER — ALBUTEROL SULFATE HFA 108 (90 BASE) MCG/ACT IN AERS: 2.0000 | INHALATION_SPRAY | Freq: Four times a day (QID) | RESPIRATORY_TRACT | 2 refills | Status: DC | PRN

## 2023-03-17 MED ORDER — AZITHROMYCIN 250 MG PO TABS: ORAL_TABLET | ORAL | 0 refills | Status: DC

## 2023-03-17 NOTE — Patient Instructions (Signed)

## 2023-03-17 NOTE — Progress Notes (Signed)
Assessment and Plan:  David Fuller was seen today for an episodic visit.  Diagnoses and all order for this visit:  Acute non-recurrent maxillary sinusitis Continue Mucinex Continue daily Cetirizine Continue to stay well hydrated to keep mucus thin and productive  - dexamethasone (DECADRON) injection 10 mg - azithromycin (ZITHROMAX) 250 MG tablet; Take 2 tablets on  Day 1,  followed by 1 tablet  daily for 4 more days    for Sinusitis  /Bronchitis  Dispense: 6 each; Refill: 0  Sore throat Warm salt water gargles several times throughout the day.  Productive cough Continue Albuterol in haler Continue Promethazine cough syrup Push fluids Steroid injection administered - tolerated well.  - dexamethasone (DECADRON) injection 10 mg  Uncomplicated asthma, unspecified asthma severity, unspecified whether persistent Continue Albuterol as directed Has in home nebulizer machine - continue to use PRN Avoid triggers Steroid injection administered - tolerated well.  - albuterol (VENTOLIN HFA) 108 (90 Base) MCG/ACT inhaler; Inhale 2 puffs into the lungs every 6 (six) hours as needed for wheezing or shortness of breath.  Dispense: 8 g; Refill: 2 - dexamethasone (DECADRON) injection 10 mg  Notify office for further evaluation and treatment, questions or concerns if s/s fail to improve. The risks and benefits of my recommendations, as well as other treatment options were discussed with the patient today. Questions were answered.  Further disposition pending results of labs. Discussed med's effects and SE's.    Over 15 minutes of exam, counseling, chart review, and critical decision making was performed.   Future Appointments  Date Time Provider Department Center  06/24/2023  9:30 AM Adela Glimpse, NP [facility] None  11/23/2023 10:00 AM Milisa Kimbell, Archie Patten, NP [facility] None     ------------------------------------------------------------------------------------------------------------------   HPI BP 122/82   Pulse 75   Temp 97.8 F (36.6 C)   Ht 6\' 1"  (1.854 m)   Wt 283 lb 6.4 oz (128.5 kg)   SpO2 98%   BMI 37.39 kg/m    Patient complains of symptoms of a URI, possible sinusitis. Symptoms include congestion, cough described as productive, headache described as throbbing, nasal congestion, sinus pressure, sneezing, sore throat, and wheezing. Onset of symptoms was 10 days ago, and has been unchanged since that time. Treatment to date: antibiotics, antihistamines, cough suppressants, decongestants, and nasal steroids.  States that he had a refill on Azithromycin/Z-Pak to which he has taken with minimal relief.  He has an albuterol inhaler that he uses PRN for hx of asthma, occasional DOE.  Takes daily Cetirizine.  Has been using Mucinex. Has Promethazine cough syrup at home. Does have an in home nebulizer but has not used.  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    Seasonal allergies 08/24/2013   Swollen lymph nodes    abd, groin,      Allergies  Allergen Reactions   Shellfish Allergy Nausea Only  Iodine Other (See Comments)    Unknown Unknown Allergic to shellfish - nausea    Current Outpatient Medications on File Prior to Visit  Medication Sig   Cetirizine HCl 10 MG CAPS Take 1 tablet by mouth daily as needed.   Cholecalciferol  (VITAMIN D3) 125 MCG (5000 UT) TABS Take 1 tablet (5,000 Units total) by mouth daily.   fluticasone (FLONASE) 50 MCG/ACT nasal spray Place 2 sprays into both nostrils as needed for allergies.   Magnesium 400 MG CAPS Take 1 tablet by mouth daily.   Methylcobalamin (B-12) 5000 MCG TBDP Take 1 tablet by mouth once a week.   metoprolol tartrate (LOPRESSOR) 25 MG tablet Take 1/2 tablet twice daily (every 12 hours) for blood pressure and heart rhythm.   rosuvastatin (CRESTOR) 5 MG tablet TAKE 1 TABLET ONCE A DAY TO CONTROL CHOLESTEROL.   vitamin C (ASCORBIC ACID) 500 MG tablet Take 1 tablet (500 mg total) by mouth daily.   zinc gluconate 50 MG tablet Take 50 mg by mouth daily.   No current facility-administered medications on file prior to visit.    ROS: all negative except what is noted in the HPI.   Physical Exam:  BP 122/82   Pulse 75   Temp 97.8 F (36.6 C)   Ht 6\' 1"  (1.854 m)   Wt 283 lb 6.4 oz (128.5 kg)   SpO2 98%   BMI 37.39 kg/m   General Appearance: NAD.  Awake, conversant and cooperative. Eyes: PERRLA, EOMs intact.  Sclera white.  Conjunctiva without erythema. Sinuses: No frontal/maxillary tenderness.  No nasal discharge. Nares patent.  ENT/Mouth: Ext aud canals clear.  Bilateral TMs w/DOL and without erythema or bulging. Hearing intact.  Posterior pharynx without swelling or exudate.  Tonsils without swelling or erythema.  Neck: Supple.  No masses, nodules or thyromegaly. Respiratory: Effort is regular with non-labored breathing. Breath sounds are equal bilaterally with expiratory wheezing in right anterior lobe. Cardio: RRR with no MRGs. Brisk peripheral pulses without edema.  Abdomen: Active BS in all four quadrants.  Soft and non-tender without guarding, rebound tenderness, hernias or masses. Lymphatics: Non tender without lymphadenopathy.  Musculoskeletal: Full ROM, 5/5 strength, normal ambulation.  No clubbing or cyanosis. Skin: Appropriate color for ethnicity. Warm  without rashes, lesions, ecchymosis, ulcers.  Neuro: CN II-XII grossly normal. Normal muscle tone without cerebellar symptoms and intact sensation.   Psych: AO X 3,  appropriate mood and affect, insight and judgment.     Adela Glimpse, NP 11:09 AM [facility]

## 2023-05-04 ENCOUNTER — Telehealth: Payer: Self-pay | Admitting: Nurse Practitioner

## 2023-05-04 DIAGNOSIS — I493 Ventricular premature depolarization: Secondary | ICD-10-CM

## 2023-05-04 DIAGNOSIS — I10 Essential (primary) hypertension: Secondary | ICD-10-CM

## 2023-05-04 DIAGNOSIS — E785 Hyperlipidemia, unspecified: Secondary | ICD-10-CM

## 2023-05-04 MED ORDER — ROSUVASTATIN CALCIUM 5 MG PO TABS: ORAL_TABLET | ORAL | 3 refills | Status: DC

## 2023-05-04 MED ORDER — METOPROLOL TARTRATE 25 MG PO TABS: ORAL_TABLET | ORAL | 3 refills | Status: DC

## 2023-05-04 NOTE — Addendum Note (Signed)
Addended by: Dionicio Stall on: 05/04/2023 10:26 AM   Modules accepted: Orders

## 2023-05-04 NOTE — Telephone Encounter (Signed)
Pt. Is requesting refills on rosuvastatin and metoprolol tartrate. Pls send to [facility] on file.

## 2023-06-23 ENCOUNTER — Ambulatory Visit: Payer: 59 | Admitting: Nurse Practitioner

## 2023-06-24 ENCOUNTER — Ambulatory Visit: Payer: 59 | Admitting: Nurse Practitioner

## 2023-06-29 ENCOUNTER — Encounter: Payer: Self-pay | Admitting: Nurse Practitioner

## 2023-06-29 ENCOUNTER — Ambulatory Visit: Payer: 59 | Admitting: Nurse Practitioner

## 2023-06-29 VITALS — BP 110/80 | HR 80 | Temp 97.7°F | Ht 73.0 in | Wt 279.6 lb

## 2023-06-29 DIAGNOSIS — K5790 Diverticulosis of intestine, part unspecified, without perforation or abscess without bleeding: Secondary | ICD-10-CM

## 2023-06-29 DIAGNOSIS — R1032 Left lower quadrant pain: Secondary | ICD-10-CM | POA: Diagnosis not present

## 2023-06-29 MED ORDER — METRONIDAZOLE 500 MG PO TABS: ORAL_TABLET | ORAL | 0 refills | Status: DC

## 2023-06-29 MED ORDER — CIPROFLOXACIN HCL 750 MG PO TABS: 750.0000 mg | ORAL_TABLET | Freq: Two times a day (BID) | ORAL | 0 refills | Status: DC

## 2023-06-29 NOTE — Progress Notes (Signed)
Assessment and Plan:  David Fuller was seen today for an episodic visit.  Diagnoses and all order for this visit:  Diverticulosis Mild - treat as outpatient Start tmt with antibiotic therapy Bland diet and push hydration. Discussed yogurt/pro-biotic to help aide in balance of gut flora while taking abx. Continue to monitor for increase in pain, fever, N/V, chills, blood in stool and notify office if symptoms present.  - ciprofloxacin (CIPRO) 750 MG tablet; Take 1 tablet (750 mg total) by mouth 2 (two) times daily.  Dispense: 14 tablet; Refill: 0 - metroNIDAZOLE (FLAGYL) 500 MG tablet; Take 1 tablet 3 x /day with Meals for Diverticulitis  Dispense: 42 tablet; Refill: 0  Left lower quadrant pain  Notify office for further evaluation and treatment, questions or concerns if s/s fail to improve. The risks and benefits of my recommendations, as well as other treatment options were discussed with the patient today. Questions were answered.  Morbid Obesity Discussed appropriate BMI Diet modification. Physical activity. Encouraged/praised to build confidence.   Further disposition pending results of labs. Discussed med's effects and SE's.    Over 15 minutes of exam, counseling, chart review, and critical decision making was performed.   Future Appointments  Date Time Provider Department Center  07/21/2023 10:30 AM Adela Glimpse, NP [facility] None  11/23/2023 10:00 AM Syann Cupples, Archie Patten, NP [facility] None    ------------------------------------------------------------------------------------------------------------------   HPI BP 110/80   Pulse 80   Temp 97.7 F (36.5 C)   Ht 6\' 1"  (1.854 m)   Wt 279 lb 9.6 oz (126.8 kg)   SpO2 98%   BMI 36.89 kg/m   Patient complains of diarrhea and diffuse abdominal pain in the LLQ. The pain is described as aching, colicky, and cramping, and is a 2/10 in intensity. Onset was 2 days ago. Symptoms have been stable since that time.  Associated symptoms: none. Aggravating factors: movement. Alleviating factors: pressure. The patient denies chills, fever, melena, mucus in the stool, nausea, and vomiting.  BMI is Body mass index is 36.89 kg/m., he has been working on diet and exercise.  Uses a weight loss app which has been successful in the past - lost 40 lb through that program.  Today he is down 4 lb in the last 3 mo. 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    Seasonal allergies 08/24/2013   Swollen lymph nodes    abd, groin,      Allergies  Allergen Reactions   Shellfish Allergy Nausea Only   Iodine Other (See Comments)    Unknown Unknown Allergic to shellfish - nausea    Current Outpatient Medications on File Prior to Visit  Medication Sig   albuterol (VENTOLIN HFA) 108 (90 Base) MCG/ACT inhaler Inhale 2 puffs into the lungs every 6 (six)  hours as needed for wheezing or shortness of breath.   Cetirizine HCl 10 MG CAPS Take 1 tablet by mouth daily as needed.   Cholecalciferol (VITAMIN D3) 125 MCG (5000 UT) TABS Take 1 tablet (5,000 Units total) by mouth daily.   fluticasone (FLONASE) 50 MCG/ACT nasal spray Place 2 sprays into both nostrils as needed for allergies.   Magnesium 400 MG CAPS Take 1 tablet by mouth daily.   Methylcobalamin (B-12) 5000 MCG TBDP Take 1 tablet by mouth once a week.   metoprolol tartrate (LOPRESSOR) 25 MG  tablet Take 1/2 tablet twice daily (every 12 hours) for blood pressure and heart rhythm.   rosuvastatin (CRESTOR) 5 MG tablet TAKE 1 TABLET ONCE A DAY TO CONTROL CHOLESTEROL.   vitamin C (ASCORBIC ACID) 500 MG tablet Take 1 tablet (500 mg total) by mouth daily.   zinc gluconate 50 MG tablet Take 50 mg by mouth daily.   azithromycin (ZITHROMAX) 250 MG tablet Take 2 tablets on  Day 1,  followed by 1 tablet  daily for 4 more days    for Sinusitis  /Bronchitis   No current facility-administered medications on file prior to visit.    ROS: all negative except what is noted in the HPI.   Physical Exam:  BP 110/80   Pulse 80   Temp 97.7 F (36.5 C)   Ht 6\' 1"  (1.854 m)   Wt 279 lb 9.6 oz (126.8 kg)   SpO2 98%   BMI 36.89 kg/m   General Appearance: NAD.  Awake, conversant and cooperative. Eyes: PERRLA, EOMs intact.  Sclera white.  Conjunctiva without erythema. Sinuses: No frontal/maxillary tenderness.  No nasal discharge. Nares patent.  ENT/Mouth: Ext aud canals clear.  Bilateral TMs w/DOL and without erythema or bulging. Hearing intact.  Posterior pharynx without swelling or exudate.  Tonsils without swelling or erythema.  Neck: Supple.  No masses, nodules or thyromegaly. Respiratory: Effort is regular with non-labored breathing. Breath sounds are equal bilaterally without rales, rhonchi, wheezing or stridor.  Cardio: RRR with no MRGs. Brisk peripheral pulses without edema.  Abdomen: Active BS in all four quadrants.  Soft and non-tender without guarding, rebound tenderness, hernias or masses. Lymphatics: Non tender without lymphadenopathy.  Musculoskeletal: Full ROM, 5/5 strength, normal ambulation.  No clubbing or cyanosis. Skin: Appropriate color for ethnicity. Warm without rashes, lesions, ecchymosis, ulcers.  Neuro: CN II-XII grossly normal. Normal muscle tone without cerebellar symptoms and intact sensation.   Psych: AO X 3,  appropriate mood and affect, insight and judgment.      Adela Glimpse, NP 10:01 AM [facility]

## 2023-06-29 NOTE — Patient Instructions (Signed)
Diverticulitis  Diverticulitis is when small pouches in your colon get infected or swollen. This causes pain in your belly (abdomen) and watery poop (diarrhea). The small pouches are called diverticula. They may form if you have a condition called diverticulosis. What are the causes? You may get this condition if poop (stool) gets trapped in the pouches in your colon. The poop lets germs (bacteria) grow. This causes an infection. What increases the risk? You are more likely to get this condition if you have small pouches in your colon. You are also more likely to get it if: You are overweight or very overweight (obese). You do not exercise enough. You drink alcohol. You smoke. You eat a lot of red meat, like beef, pork, or lamb. You do not eat enough fiber. You are older than 62 years of age. What are the signs or symptoms? Pain in your belly. Pain is often on the left side, but it may be felt in other spots too. Fever and chills. Feeling like you may vomit. Vomiting. Having cramps. Feeling full. Changes in how often you poop. Blood in your poop. How is this treated? Most cases are treated at home. You may be told to: Take over-the-counter pain medicines. Only eat and drink clear liquids. Take antibiotics. Rest. Very bad cases may need to be treated at a hospital. Treatment may include: Not eating or drinking. Taking pain medicines. Getting antibiotics through an IV tube. Getting fluid and food through an IV tube. Having surgery. When you are feeling better, you may need to have a test to look at your colon (colonoscopy). Follow these instructions at home: Medicines Take over-the-counter and prescription medicines only as told by your doctor. These include: Fiber pills. Probiotics. Medicines to make your poop soft (stool softeners). If you were prescribed antibiotics, take them as told by your doctor. Do not stop taking them even if you start to feel better. Ask your  doctor if you should avoid driving or using machines while you are taking your medicine. Eating and drinking  Follow the diet told by your doctor. You may need to only eat and drink liquids. When you feel better, you may be able to eat more foods. You may also be told to eat a lot of fiber. Fiber helps you poop. Foods with fiber include berries, beans, lentils, and green vegetables. Try not to eat red meat. General instructions Do not smoke or use any products that contain nicotine or tobacco. If you need help quitting, ask your doctor. Exercise 3 or more times a week. Try to go for 30 minutes each time. Exercise enough to sweat and make your heart beat faster. Contact a doctor if: Your pain gets worse. You are not pooping like normal. Your symptoms do not get better. Your symptoms get worse very fast. You have a fever. You vomit more than one time. You have poop that is: Bloody. Black. Tarry. This information is not intended to replace advice given to you by your health care provider. Make sure you discuss any questions you have with your health care provider. Document Revised: 07/24/2022 Document Reviewed: 07/24/2022 Elsevier Patient Education  2024 [employer].

## 2023-07-21 ENCOUNTER — Ambulatory Visit: Payer: 59 | Admitting: Nurse Practitioner

## 2023-11-23 ENCOUNTER — Encounter: Payer: 59 | Admitting: Nurse Practitioner

## 2023-12-23 ENCOUNTER — Ambulatory Visit: Payer: 59 | Admitting: Cardiovascular Disease

## 2023-12-28 ENCOUNTER — Encounter: Payer: 59 | Admitting: Nurse Practitioner

## 2023-12-30 ENCOUNTER — Other Ambulatory Visit: Payer: Self-pay

## 2023-12-30 DIAGNOSIS — J45909 Unspecified asthma, uncomplicated: Secondary | ICD-10-CM

## 2023-12-30 MED ORDER — ALBUTEROL SULFATE HFA 108 (90 BASE) MCG/ACT IN AERS: 2.0000 | INHALATION_SPRAY | Freq: Four times a day (QID) | RESPIRATORY_TRACT | 2 refills | Status: DC | PRN

## 2023-12-31 ENCOUNTER — Other Ambulatory Visit: Payer: Self-pay | Admitting: Internal Medicine

## 2023-12-31 ENCOUNTER — Encounter: Payer: Self-pay | Admitting: Internal Medicine

## 2023-12-31 ENCOUNTER — Ambulatory Visit (HOSPITAL_COMMUNITY)
Admission: RE | Admit: 2023-12-31 | Discharge: 2023-12-31 | Disposition: A | Discharge status: Home / Self Care | Payer: 59 | Source: Ambulatory Visit | Attending: Internal Medicine | Admitting: Internal Medicine

## 2023-12-31 ENCOUNTER — Ambulatory Visit: Payer: 59 | Admitting: Internal Medicine

## 2023-12-31 VITALS — BP 120/80 | HR 80 | Ht 73.0 in | Wt 279.0 lb

## 2023-12-31 DIAGNOSIS — J22 Unspecified acute lower respiratory infection: Secondary | ICD-10-CM | POA: Diagnosis not present

## 2023-12-31 DIAGNOSIS — R0989 Other specified symptoms and signs involving the circulatory and respiratory systems: Secondary | ICD-10-CM

## 2023-12-31 DIAGNOSIS — J9801 Acute bronchospasm: Secondary | ICD-10-CM

## 2023-12-31 DIAGNOSIS — Z8709 Personal history of other diseases of the respiratory system: Secondary | ICD-10-CM

## 2023-12-31 DIAGNOSIS — G4733 Obstructive sleep apnea (adult) (pediatric): Secondary | ICD-10-CM

## 2023-12-31 MED ORDER — CEFTRIAXONE SODIUM 1 G IJ SOLR
1.0000 g | Freq: Once | INTRAMUSCULAR | Status: AC
Start: 2023-12-31 — End: 2023-12-31
  Administered 2023-12-31: 1 g via INTRAMUSCULAR

## 2023-12-31 MED ORDER — LEVOFLOXACIN 500 MG PO TABS: 500.0000 mg | ORAL_TABLET | Freq: Every day | ORAL | 0 refills | Status: AC

## 2023-12-31 MED ORDER — HYDROCODONE BIT-HOMATROP MBR 5-1.5 MG/5ML PO SOLN: 5.0000 mL | Freq: Three times a day (TID) | ORAL | 0 refills | Status: DC | PRN

## 2023-12-31 NOTE — Progress Notes (Addendum)
 Patient Care Team: Margaree Mackintosh, MD as PCP - General (Internal Medicine) Nahser, Deloris Ping, MD as PCP - Cardiology (Cardiology) Quintella Reichert, MD as PCP - Sleep Medicine (Cardiology) Cherlyn Roberts, MD as Consulting Physician (Dermatology) Sharrell Ku, MD as Consulting Physician (Gastroenterology) Mateo Flow, MD as Consulting Physician (Ophthalmology)  Visit Date: 12/31/23  Subjective:   Chief Complaint  Patient presents with   Cough   Wheezing  Patient UY:[id number] David Fuller DOB:[birth year],63 y.o. VZD:[id number]   63 y.o. Male presents today for acute sick visit with Productive Cough and Wheezing x5-6 days. Past medical history of Asthma treated with Albuterol inhaler. Reports he noticed symptoms onset Saturday, has gone to urgent care on [address] and was prescribed a  Zithromax Z-PAK (which he is on the last day of today), steroid injection IM, and steroid dose pack. Endorses improvement in symptoms, but is still wheezing. Denies fever, malaise, or difficulty sleeping due to cough (does use CPAP). Notes he had an episode of bronchospasm lasting 1-1.5 minutes last night after walking outside to bring in their dog.  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 One adult daughter and one grandson. Wife is a Water quality scientist for [employer].  Review of Systems  Respiratory:  Positive for cough (productive) and wheezing.      Objective:  Vitals: BP 120/80   Pulse 80   Ht 6\' 1"  (1.854 m)   Wt 279 lb (126.6 kg)   SpO2 96%   BMI 36.81 kg/m   Physical Exam Vitals and nursing note reviewed.  Constitutional:      General: He is not in acute distress.    Appearance: Normal appearance. He is not ill-appearing.  HENT:     Head: Normocephalic and atraumatic.     Right Ear: Hearing normal.     Left Ear: Hearing normal.     Ears:     Comments: TMs pink w/ splayed light reflex bilaterally     Mouth/Throat:     Pharynx: Posterior oropharyngeal erythema (slightly) present. No oropharyngeal exudate.  Pulmonary:     Effort: Pulmonary effort is normal.     Breath sounds: Transmitted upper airway sounds present. Wheezing and rhonchi present.  Skin:  General: Skin is warm and dry.  Neurological:     Mental Status: He is alert and oriented to person, place, and time. Mental status is at baseline.  Psychiatric:        Mood and Affect: Mood normal.        Behavior: Behavior normal.        Thought Content: Thought content normal.        Judgment: Judgment normal.     Results:  Studies Obtained And Personally Reviewed By Me: Labs:     Component Value Date/Time   NA 141 11/20/2022 0000   NA 140 12/16/2018 0931   K 4.4 11/20/2022 0000   CL 104 11/20/2022 0000   CO2 27 11/20/2022 0000   GLUCOSE 109 (H) 11/20/2022 0000   BUN 12 11/20/2022 0000   BUN 15 12/16/2018 0931   CREATININE 0.90 11/20/2022 0000   CALCIUM 9.7  11/20/2022 0000   PROT 7.2 11/20/2022 0000   PROT 6.5 12/16/2018 0931   ALBUMIN 3.9 09/23/2022 1005   ALBUMIN 4.3 12/16/2018 0931   AST 25 11/20/2022 0000   ALT 38 11/20/2022 0000   ALKPHOS 61 09/23/2022 1005   BILITOT 1.2 11/20/2022 0000   BILITOT 0.8 12/16/2018 0931   GFRNONAA >60 09/23/2022 1005   GFRNONAA 86 10/11/2020 1029   GFRAA 100 10/11/2020 1029    Lab Results  Component Value Date   WBC 7.6 11/20/2022   HGB 16.6 11/20/2022   HCT 47.4 11/20/2022   MCV 88.6 11/20/2022   PLT 272 11/20/2022   Lab Results  Component Value Date   CHOL 168 11/20/2022   HDL 52 11/20/2022   LDLCALC 100 (H) 11/20/2022   TRIG 74 11/20/2022   CHOLHDL 3.2 11/20/2022   Lab Results  Component Value Date   HGBA1C 5.9 (H) 11/20/2022    Lab Results  Component Value Date   TSH 2.11 11/20/2022    Lab Results  Component Value Date   PSA 1.19 11/20/2022   PSA 1.00 07/18/2021   PSA 1.0 02/20/2020     Assessment & Plan:  Acute lower Respiratory Infection Rhonchi, Lung Bases Bilaterally: heard on physical exam. Given 1 g Rocephin today. Sending in 500 mg Levaquin - take 1 tablet daily with a meal for 7 days. Ordering DG Chest View 2 to be performed ASAP. Stay well rested, well hydrated, and well nourished. Walk around some to prevent atelectasis. Use albuterol inhaler 4 times dailyContact [place] if symptoms worsen/persist despite treatment.      I,Emily Lagle,acting as a Neurosurgeon for Margaree Mackintosh, MD.,have documented all relevant documentation on the behalf of Margaree Mackintosh, MD,as directed by  Margaree Mackintosh, MD while in the presence of Margaree Mackintosh, MD.   I, Margaree Mackintosh, MD, have reviewed all documentation for this visit. The documentation on 01/08/24 for the exam, diagnosis, procedures, and orders are all accurate and complete.

## 2024-01-08 NOTE — Patient Instructions (Addendum)
 We are sorry you are not feeling well. Given  Levaquin 500 mg daily for 7 days, Hycodan cough syrup, Rocephin one gram IM. Have CXR. Rest and stay well hydrated. Call if not better in 48 hours or sooner if worse. It was a pleasure to see you today.

## 2024-01-24 NOTE — Progress Notes (Unsigned)
 Cardiology Office Note    Date:  01/25/2024  ID:  David Fuller, DOB 1961/01/13, MRN [id number] PCP:  Margaree Mackintosh, MD  Cardiologist:  New, Dr. Elease Hashimoto    Chief Complaint: palpitations       Previous notes per David Spies, David Fuller   David Fuller  is a 63 y.o. male with history of asthma, HTN, HLD, obesity, suspected OSA, seasonal allergies, diverticulitis who is being seen today for the evaluation of palpitations at the request of Dr. Deretha Emory (EDP).  He has no prior cardiac history. He was previously told he had suspected sleep apnea due to an overnight pulse ox test in the past but never had a formal sleep study due to some technical issues with scheduling with pulmonology. In June of this year he noticed sensation of palpitations following a temporary increase in caffeine intake (6 drinks per day). He quit caffeine cold [place] and these resolved. In early September he noticed about 3 days of recurrent palpitations described as skips/flips that felt like a fullness in his throat. This began shortly after he had worked outside in the extreme heat - he thinks he had lost a lot of fluid in sweat. Due to persistent symptoms he was seen in the ED 07/19/17 where he was found to have rare PVCs. Troponin was negative. CBC wnl, BMET showed K 4.7, Cr 0.93, glucose 116. Remote MG/TSH 01/2017 wnl and LDL 146. He was advised to establish care with cardiology. He continues to have occasional palpitations most days of the week. He does not believe he's had any recent tachypalpitations. He does snore. He's been actively trying to lose weight -30lb over the last year. He wants to be healthier to be around for his 2.5-yea-old grandson. He also walks daily and rides his bike every other day. He's not had any recent chest pain, dyspnea, LEE, syncope. Palpitations improve with physical exercise.  Nov. 6, 2018:  Doing better  Still having palpitations Worse with caffiene. Is under lots of stress .    Echocardiogram from October 5 shows normal left ventricular systolic function with EF 65-70%.  He has grade 1 diastolic dysfunction.  There is very minimal ascending aortic dilatation.  Normal pulmonary artery pressures. Tries to watch his salt .   Occasional hot dog.  Has lost 32 lbs over the past year .   Walks , rides his bike   Nov. 6, 2019:  Doing well Had some PVCs Tried verapamil in place of the metoprolol  Checked his electrolytes.  OSA is well controlled.  Wt was 256 lbs   Mood seems to be OK on Celexa 20 mg a day  Exercising regularly .   Joined [employer] - goes 4 times a week  No significant arrhythmias  After workouts  Has been eating salads with chicken for the past several months   Dec. 9, 2020    Will be seen today for follow-up of his premature ventricular contractions and hypertension. Wt is 276 lbs ( (+ 20 lbs from last year)  Is walking 4 miles a day .  Admits to eating too much  Palpitations are well controlled  Feb. 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 14, 2024  David Fuller is seen for follow up of his HTN, PVC OSA - sees Dr. Debroah Loop is 279 lbs  Weight has waxed and waned ,   Working out on the tread climber  Working on a better diet .  Drinks soft drinks    January 25, 2024,  David Fuller  is seen for follow up of his HTN, PVCs OSA.   Wt is 281 lbs Has been under lots of stress  Has started Noom Will start cycling   Retired as a Radiation protection practitioner .   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    Seasonal allergies 08/24/2013   Swollen lymph nodes    abd, groin,     Past Surgical History:  Procedure Laterality Date   VASECTOMY  MID 90S    Current Medications: Current Meds  Medication Sig   albuterol (VENTOLIN HFA) 108 (90 Base) MCG/ACT inhaler Inhale 2 puffs into the lungs every 6 (six) hours as needed for wheezing or shortness of breath.   Cetirizine HCl 10 MG CAPS Take 1 tablet by mouth daily as needed.   Cholecalciferol (VITAMIN D3) 125 MCG (5000 UT) TABS Take 1 tablet (5,000 Units total) by mouth daily.   fluticasone (FLONASE) 50 MCG/ACT nasal spray Place 2 sprays into both nostrils as needed for allergies.   Magnesium 400 MG CAPS Take 1 tablet by mouth daily.   Methylcobalamin (B-12) 5000 MCG TBDP Take 1 tablet by mouth once a week.   metoprolol tartrate (LOPRESSOR) 25 MG tablet Take 1/2 tablet twice daily (every 12 hours) for blood pressure and heart rhythm.   rosuvastatin (CRESTOR) 5 MG tablet TAKE 1 TABLET ONCE A DAY TO CONTROL CHOLESTEROL.   vitamin C (ASCORBIC ACID) 500 MG tablet Take 1 tablet (500 mg total) by mouth daily.   zinc gluconate 50 MG tablet Take 50 mg by mouth daily.     Allergies:   Shellfish allergy and Iodine   Social History   Socioeconomic History   Marital status: Married    Spouse name: Not on file   Number of children: Not on file    Years of education: Not on file   Highest education level: Not on file  Occupational History   Not on file  Tobacco Use   Smoking status: Never   Smokeless tobacco: Former    Types: Chew    Quit date: 1980  Vaping Use   Vaping status: Never Used  Substance and Sexual Activity   Alcohol use: Not Currently    Alcohol/week: 0.0 - 2.0 standard drinks of alcohol    Comment: 2OZ A WEEK   Drug use: No   Sexual activity: Yes    Partners: Female    Comment: vasectomy  Other Topics Concern   Not on file  Social History Narrative   Not on file   Social Drivers of Health   Financial Resource Strain: Not on file  Food Insecurity: Not on file  Transportation Needs: Not on file  Physical Activity: Not on file  Stress: Not on file  Social Connections: Not on file     Family History:  Family History  Problem Relation Age of Onset   Atrial fibrillation Mother    Stroke Mother    Diabetes Father    Heart disease Father    Heart failure Father    Cancer Brother        Melanoma- left arm   Leukemia Maternal Grandmother    Cancer Paternal Grandmother     ROS:   Please see the history of present illness.  All other systems are reviewed and otherwise negative.     Physical Exam: Blood pressure 136/78, pulse 78, height 6\' 1"  (1.854 m), weight 281 lb (127.5 kg), SpO2 98%.       GEN:  moderately obese , middle age male in no acute distress HEENT: Normal NECK: No JVD; No carotid bruits LYMPHATICS: No lymphadenopathy CARDIAC: RRR , no murmurs, rubs, gallops RESPIRATORY:  Clear to auscultation without rales, wheezing or rhonchi  ABDOMEN: Soft, non-tender, non-distended MUSCULOSKELETAL:  No edema; No deformity  SKIN: Warm and dry NEUROLOGIC:  Alert and oriented x 3     Wt Readings from Last 3 Encounters:  01/25/24 281 lb (127.5 kg)  12/31/23 279 lb (126.6 kg)  06/29/23 279 lb 9.6 oz (126.8 kg)      Studies/Labs Reviewed:   EKG:          Recent Labs: No  results found for requested labs within last 365 days.   Lipid Panel    Component Value Date/Time   CHOL 168 11/20/2022 0000   CHOL 165 12/16/2018 0931   TRIG 74 11/20/2022 0000   HDL 52 11/20/2022 0000   HDL 50 12/16/2018 0931   CHOLHDL 3.2 11/20/2022 0000   VLDL 18 01/13/2017 1445   LDLCALC 100 (H) 11/20/2022 0000    Additional studies/ records that were reviewed today include: Summarized above.   ASSESSMENT & PLAN:    1.  Palpitations -  has been under lots of stress.  Is overall doing well      2.  Essential HTN -    BP is well controlled.    3.  Hyperlipidemia -   will get a coronary calcium score .  Will have lipids with Dr. Lenord Fellers in several weeks     4.   OSA-   follow up with David Fuller    Medication Adjustments/Labs and Tests Ordered: Current medicines are reviewed at length with the patient today.  Concerns regarding medicines are outlined above. Medication changes, Labs and Tests ordered today are summarized above and listed in the Patient Instructions accessible in Encounters.   Signed, Kristeen Miss, MD  01/25/2024 10:44 AM    [facility] [address] [place], [place], [place]  [address] Phone: [phone]; Fax: [phone]

## 2024-01-25 ENCOUNTER — Ambulatory Visit: Payer: 59 | Attending: Cardiovascular Disease | Admitting: Cardiovascular Disease

## 2024-01-25 ENCOUNTER — Encounter: Payer: Self-pay | Admitting: Cardiovascular Disease

## 2024-01-25 VITALS — BP 136/78 | HR 78 | Ht 73.0 in | Wt 281.0 lb

## 2024-01-25 DIAGNOSIS — E782 Mixed hyperlipidemia: Secondary | ICD-10-CM | POA: Diagnosis not present

## 2024-01-25 DIAGNOSIS — I10 Essential (primary) hypertension: Secondary | ICD-10-CM

## 2024-01-25 NOTE — Patient Instructions (Signed)
 Testing/Procedures: Coronary Calcium Score CT Your physician has requested that you have cardiac CT. Cardiac computed tomography (CT) is a painless test that uses an x-ray machine to take clear, detailed pictures of your heart. For further information please visit [email or web address] Please follow instruction sheet as given.  Follow-Up: At [facility], you and your health needs are our priority.  As part of our continuing mission to provide you with exceptional heart care, we have created designated Provider Care Teams.  These Care Teams include your primary Cardiologist (physician) and Advanced Practice Providers (APPs -  Physician Assistants and Nurse Practitioners) who all work together to provide you with the care you need, when you need it.  Your next appointment:   1 year(s)  Provider:   Kristeen Miss, MD      1st Floor: - Lobby - Registration  - Pharmacy  - Lab - Cafe  2nd Floor: - PV Lab - Diagnostic Testing (echo, CT, nuclear med)  3rd Floor: - Vacant  4th Floor: - TCTS (cardiothoracic surgery) - AFib Clinic - Structural Heart Clinic - Vascular Surgery  - Vascular Ultrasound  5th Floor: - HeartCare Cardiology (general and EP) - Clinical Pharmacy for coumadin, hypertension, lipid, weight-loss medications, and med management appointments    Valet parking services will be available as well.

## 2024-01-27 ENCOUNTER — Encounter: Payer: 59 | Admitting: Nurse Practitioner

## 2024-02-17 ENCOUNTER — Ambulatory Visit: Attending: Cardiology | Admitting: Cardiology

## 2024-02-17 DIAGNOSIS — I10 Essential (primary) hypertension: Secondary | ICD-10-CM | POA: Diagnosis not present

## 2024-02-17 DIAGNOSIS — G4733 Obstructive sleep apnea (adult) (pediatric): Secondary | ICD-10-CM

## 2024-02-17 NOTE — Progress Notes (Signed)
 SLEEP MEDICINE VIRTUAL VISIT      Virtual Visit via Video Note   Because of Acheron Sugg Kassa's co-morbid illnesses, he is at least at moderate risk for complications without adequate follow up.  This format is felt to be most appropriate for this patient at this time.  All issues noted in this document were discussed and addressed.  A limited physical exam was performed with this format.  Please refer to the patient's chart for his consent to telehealth for [facility].       Date:  02/17/2024   ID:  David Fuller, DOB 08-15-1961, MRN [id number] The patient was identified using 2 identifiers.  Patient Location: Home Provider Location: Home Office   PCP:  Baxley, Luanna Cole, MD   [facility] Providers Cardiologist:  Kristeen Miss, MD Sleep Medicine:  Armanda Magic, MD     Evaluation Performed:  Follow-Up Visit  Chief Complaint:  OSA  History of Present Illness:    David Fuller is a 63 y.o. male with a hx of PSG showed moderate obstructive sleep apnea with an AHI of 23/h mild oxygen saturations as low as 86%.  Due to ongoing respiratory events he was not able to be titrated adequately with CPAP and underwent BiPAP titration to 17/13 cm H2O.     He is doing well with his PAP device and thinks that he has gotten used to it.  He tolerates the mask and feels the pressure is adequate.  Since going on PAP he feels rested in the am and has no significant daytime sleepiness.  He denies any significant mouth or nasal dryness or nasal congestion.  He does not think that he snores.    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    Seasonal allergies 08/24/2013   Swollen lymph nodes    abd, groin,    Past Surgical History:  Procedure Laterality Date   VASECTOMY  MID 90S     Current Meds  Medication Sig   albuterol (VENTOLIN HFA) 108 (90 Base) MCG/ACT inhaler Inhale 2 puffs into the lungs every 6 (six) hours as needed for wheezing or shortness of breath.   Cetirizine HCl 10 MG CAPS Take 1 tablet by mouth daily as needed.   Cholecalciferol (VITAMIN D3) 125 MCG (5000 UT) TABS Take 1 tablet (5,000 Units total) by mouth daily.   fluticasone (FLONASE) 50 MCG/ACT nasal spray Place 2 sprays into both nostrils as needed for allergies.   Magnesium 400 MG CAPS Take 1 tablet by mouth daily.   Methylcobalamin (B-12) 5000 MCG TBDP Take 1 tablet by mouth once a week.   metoprolol tartrate (LOPRESSOR) 25 MG tablet Take 1/2 tablet twice daily (every 12 hours) for blood pressure and heart rhythm.   rosuvastatin (CRESTOR) 5 MG tablet TAKE 1 TABLET ONCE A DAY TO CONTROL CHOLESTEROL.   vitamin C (ASCORBIC ACID) 500 MG tablet Take 1 tablet (500 mg total) by mouth daily.  zinc gluconate 50 MG tablet Take 50 mg by mouth daily.     Allergies:   Shellfish allergy and Iodine   Social History   Tobacco Use   Smoking status: Never   Smokeless tobacco: Former    Types: Chew    Quit date: 1980  Vaping Use   Vaping status: Never Used  Substance Use Topics   Alcohol use: Not Currently    Alcohol/week: 0.0 - 2.0 standard drinks of alcohol    Comment: 2OZ A WEEK   Drug use: No     Family Hx: The patient's family history includes Atrial fibrillation in his mother; Cancer in his brother and paternal grandmother; Diabetes in his father; Heart disease in his father; Heart failure in his father; Leukemia in his maternal grandmother; Stroke in his mother.  ROS:   Please see the history of present illness.    He All other systems reviewed  and are negative.   Prior Sleep studies:   The following studies were reviewed today:  his  Labs/Other Tests and Data Reviewed:     Recent Labs: No results found for requested labs within last 365 days.   Wt Readings from Last 3 Encounters:  01/25/24 281 lb (127.5 kg)  12/31/23 279 lb (126.6 kg)  06/29/23 279 lb 9.6 oz (126.8 kg)     Risk Assessment/Calculations:      Objective:    Vital Signs:  There were no vitals taken for this visit.   VITAL SIGNS:  reviewed GEN:  no acute distress EYES:  sclerae anicteric, EOMI - Extraocular Movements Intact RESPIRATORY:  normal respiratory effort, symmetric expansion CARDIOVASCULAR:  no peripheral edema SKIN:  no rash, lesions or ulcers. MUSCULOSKELETAL:  no obvious deformities. NEURO:  alert and oriented x 3, no obvious focal deficit PSYCH:  normal affect  ASSESSMENT & PLAN:    #OSA - The patient is tolerating PAP therapy well without any problems. The PAP download performed by his DME was personally reviewed and interpreted by me today and showed an AHI of 0.9/hr on auto BiPAP cm H2O with 100% compliance in using more than 4 hours nightly.  The patient has been using and benefiting from PAP use and will continue to benefit from therapy.   #HTN -BP controlled at 120-130/70's at home -continue prescription drug management with Lopressor 12.5mg  BID with PRN refills    Total time of encounter: 15 minutes total time of encounter, including 10 minutes spent in face-to-face patient care on the date of this encounter. This time includes coordination of care and counseling regarding above mentioned problem list. Remainder of non-face-to-face time involved reviewing chart documents/testing relevant to the patient encounter and documentation in the medical record. I have independently reviewed documentation from referring provider.    Medication Adjustments/Labs and Tests Ordered: Current medicines are reviewed at length with the  patient today.  Concerns regarding medicines are outlined above.   Tests Ordered: No orders of the defined types were placed in this encounter.   Medication Changes: No orders of the defined types were placed in this encounter.   Follow Up:  In Person in 1 year(s)  Signed, Armanda Magic, MD  02/17/2024 8:24 AM    [facility]

## 2024-02-17 NOTE — Patient Instructions (Signed)
 Medication Instructions:  Your physician recommends that you continue on your current medications as directed. Please refer to the Current Medication list given to you today.  *If you need a refill on your cardiac medications before your next appointment, please call your pharmacy*  Follow-Up: At [facility], you and your health needs are our priority.  As part of our continuing mission to provide you with exceptional heart care, we have created designated Provider Care Teams.  These Care Teams include your primary Cardiologist (physician) and Advanced Practice Providers (APPs -  Physician Assistants and Nurse Practitioners) who all work together to provide you with the care you need, when you need it.  Your next appointment:   1 year(s)  The format for your next appointment:   In Person  Provider:   Armanda Magic, MD {  Other Instructions   1st Floor: - Lobby - Registration  - Pharmacy  - Lab - Cafe  2nd Floor: - PV Lab - Diagnostic Testing (echo, CT, nuclear med)  3rd Floor: - Vacant  4th Floor: - TCTS (cardiothoracic surgery) - AFib Clinic - Structural Heart Clinic - Vascular Surgery  - Vascular Ultrasound  5th Floor: - HeartCare Cardiology (general and EP) - Clinical Pharmacy for coumadin, hypertension, lipid, weight-loss medications, and med management appointments    Valet parking services will be available as well.

## 2024-02-18 ENCOUNTER — Other Ambulatory Visit (INDEPENDENT_AMBULATORY_CARE_PROVIDER_SITE_OTHER): Payer: 59

## 2024-02-18 DIAGNOSIS — I10 Essential (primary) hypertension: Secondary | ICD-10-CM

## 2024-02-18 DIAGNOSIS — E785 Hyperlipidemia, unspecified: Secondary | ICD-10-CM

## 2024-02-18 DIAGNOSIS — Z0000 Encounter for general adult medical examination without abnormal findings: Secondary | ICD-10-CM | POA: Diagnosis not present

## 2024-02-18 LAB — POCT URINALYSIS DIP (CLINITEK)
Bilirubin, UA: NEGATIVE
Blood, UA: NEGATIVE
Glucose, UA: NEGATIVE mg/dL
Ketones, POC UA: NEGATIVE mg/dL
Leukocytes, UA: NEGATIVE
Nitrite, UA: NEGATIVE
Spec Grav, UA: 1.02 (ref 1.010–1.025)
Urobilinogen, UA: 0.2 U/dL
pH, UA: 6 (ref 5.0–8.0)

## 2024-02-18 NOTE — Addendum Note (Signed)
 Addended by: Claudie Leach on: 02/18/2024 10:49 AM   Modules accepted: Orders

## 2024-02-20 LAB — CBC WITH DIFFERENTIAL/PLATELET
Absolute Lymphocytes: 989 {cells}/uL (ref 850–3900)
Absolute Monocytes: 636 {cells}/uL (ref 200–950)
Basophils Absolute: 50 {cells}/uL (ref 0–200)
Basophils Relative: 0.8 %
Eosinophils Absolute: 233 {cells}/uL (ref 15–500)
Eosinophils Relative: 3.7 %
HCT: 45.7 % (ref 38.5–50.0)
Hemoglobin: 16 g/dL (ref 13.2–17.1)
MCH: 30.9 pg (ref 27.0–33.0)
MCHC: 35 g/dL (ref 32.0–36.0)
MCV: 88.2 fL (ref 80.0–100.0)
MPV: 10.2 fL (ref 7.5–12.5)
Monocytes Relative: 10.1 %
Neutro Abs: 4391 {cells}/uL (ref 1500–7800)
Neutrophils Relative %: 69.7 %
Platelets: 253 10*3/uL (ref 140–400)
RBC: 5.18 10*6/uL (ref 4.20–5.80)
RDW: 12.8 % (ref 11.0–15.0)
Total Lymphocyte: 15.7 %
WBC: 6.3 10*3/uL (ref 3.8–10.8)

## 2024-02-20 LAB — COMPLETE METABOLIC PANEL WITHOUT GFR
AG Ratio: 2 (calc) (ref 1.0–2.5)
ALT: 38 U/L (ref 9–46)
AST: 28 U/L (ref 10–35)
Albumin: 4.5 g/dL (ref 3.6–5.1)
Alkaline phosphatase (APISO): 77 U/L (ref 35–144)
BUN: 13 mg/dL (ref 7–25)
CO2: 24 mmol/L (ref 20–32)
Calcium: 9.3 mg/dL (ref 8.6–10.3)
Chloride: 105 mmol/L (ref 98–110)
Creat: 0.98 mg/dL (ref 0.70–1.35)
Globulin: 2.3 g/dL (ref 1.9–3.7)
Glucose, Bld: 141 mg/dL — ABNORMAL HIGH (ref 65–99)
Potassium: 4 mmol/L (ref 3.5–5.3)
Sodium: 137 mmol/L (ref 135–146)
Total Bilirubin: 1.3 mg/dL — ABNORMAL HIGH (ref 0.2–1.2)
Total Protein: 6.8 g/dL (ref 6.1–8.1)

## 2024-02-20 LAB — LIPID PANEL
Cholesterol: 141 mg/dL (ref <200)
HDL: 39 mg/dL — ABNORMAL LOW (ref >=40)
LDL Cholesterol (Calc): 85 mg/dL
Non-HDL Cholesterol (Calc): 102 mg/dL (ref <130)
Total CHOL/HDL Ratio: 3.6 (calc) (ref <5.0)
Triglycerides: 77 mg/dL (ref <150)

## 2024-02-20 LAB — TEST AUTHORIZATION

## 2024-02-20 LAB — HEMOGLOBIN A1C W/OUT EAG: Hgb A1c MFr Bld: 6.3 %{Hb} — ABNORMAL HIGH (ref <5.7)

## 2024-02-20 LAB — PSA: PSA: 1.43 ng/mL (ref <=4.00)

## 2024-02-22 ENCOUNTER — Ambulatory Visit: Payer: 59 | Admitting: Internal Medicine

## 2024-02-22 VITALS — BP 128/84 | HR 95 | Temp 98.5°F | Ht 73.0 in | Wt 277.1 lb

## 2024-02-22 DIAGNOSIS — R7302 Impaired glucose tolerance (oral): Secondary | ICD-10-CM | POA: Diagnosis not present

## 2024-02-22 DIAGNOSIS — E538 Deficiency of other specified B group vitamins: Secondary | ICD-10-CM

## 2024-02-22 DIAGNOSIS — Z6836 Body mass index (BMI) 36.0-36.9, adult: Secondary | ICD-10-CM

## 2024-02-22 DIAGNOSIS — Z0000 Encounter for general adult medical examination without abnormal findings: Secondary | ICD-10-CM

## 2024-02-22 DIAGNOSIS — G4733 Obstructive sleep apnea (adult) (pediatric): Secondary | ICD-10-CM

## 2024-02-22 DIAGNOSIS — E669 Obesity, unspecified: Secondary | ICD-10-CM | POA: Diagnosis not present

## 2024-02-22 DIAGNOSIS — E785 Hyperlipidemia, unspecified: Secondary | ICD-10-CM

## 2024-02-22 DIAGNOSIS — I10 Essential (primary) hypertension: Secondary | ICD-10-CM | POA: Diagnosis not present

## 2024-02-22 DIAGNOSIS — J45909 Unspecified asthma, uncomplicated: Secondary | ICD-10-CM

## 2024-02-22 DIAGNOSIS — E559 Vitamin D deficiency, unspecified: Secondary | ICD-10-CM

## 2024-02-22 DIAGNOSIS — Z23 Encounter for immunization: Secondary | ICD-10-CM | POA: Diagnosis not present

## 2024-02-22 NOTE — Progress Notes (Addendum)
 New Patient Visit   Patient Care Team: Modest Draeger, Jaynie Meyers, MD as PCP - General (Internal Medicine) Nahser, Lela Purple, MD as PCP - Cardiology (Cardiology) Jacqueline Matsu, MD as PCP - Sleep Medicine (Cardiology) Eduardo Grade, MD as Consulting Physician (Dermatology) Serafin Dames, MD as Consulting Physician (Gastroenterology) Amedeo Jupiter, MD as Consulting Physician (Ophthalmology)  Visit Date: 02/22/24   Chief Complaint  Patient presents with   Annual Exam   Subjective:  Patient: David Fuller, Male DOB: [birth year], 63 y.o. MRN: [id number]  David Fuller is a 63 y.o. Male who presents today for annual physical exam. Current problems of: Obstructive Sleep Apnea; Hyperlipidemia; Seasonal Allergies; Asthma; Essential Hypertension; Morbid Obesity - BMI 30+ With OSA; History of Nephrolithiasis; PVC's (Premature Ventricular Contractions); Anorectal Fissure; Diverticulosis; Vitamin D  Deficiency; and B12 Deficiency.  History of Hypertension treated with Metoprolol  tartrate 12.5 mg twice daily. Blood Pressure: normotensive today at 128/84.    History of Hyperlipidemia treated with  Rosuvastatin  5 mg daily. 02/18/2024 Lipid Panel: HDL 39, decreased from previous trends of 41-54; otherwise WNL. LDL has been elevated in the past, most recently 2024, from 100 to 152; Cholesterol was last elevated in 2019, has been 201 - 222; Triglycerides have been elevated once in 2019 at 179.   HTN & HLD followed by Dr. Ahmad Alert, Cardiologist.    History of Impaired Glucose Tolerance 02/18/2024 Glucose 141, elevated from 109 in 2024. Has been consistently elevated from 101 - 129 from 2020 - 2023, except in 2022 when it was 93. HgbA1c 6.3, elevated from 5.9 in 11/2022, elevated from previous trends of 5.5 in 2022, 5.1 in 2019, 5.6 in 2016, was elevated at 5.7 in 2015, and was 5.5 in 2014. Says that he has started to work on his diet and exercise with a goal of 222 pounds-- he would like to check  back on this in 6 months.   History of Obesity; BMI since 2012 his weight has fluctuated from 267, BMI 37.24 to his weight today of 277, BMI 36.56. His highest weight has been 284, BMI 38.52 with his lowest being 244, BMI 32.19 but has generally remained consistent around 279, BMI 36.81. David Fuller  History of Obstructive Sleep Apnea treated with CPAP device.  History of Asthma; Seasonal Allergies managed with Flonase .  History of Vitamin Deficiencies, D and B12, treated with Vitamin-D 5,000 units daily and PO Vitamin-B12 5,000 mcg weekly.   Labs 4/10/205 CBC: WNL CMP, compared to 11/2022: Blood Glucose 141, elevated from 109; Total Bilirubin 1.3, elevated from 1.2; otherwise WNL.    Colonoscopy 08/2016 found Hyperplastic Rectal Polyp & Diverticulosis with repeat recommendation of 2027.  PSA  1.43  02/18/2024    Vaccine Counseling: Due for Flu, Shingles 1/2, and PNA - discussed, he is agreeable to receiving PNA today, postpones Shingrix, and declines Covid-19; UTD on Flu and Tdap. Past Medical History:  Diagnosis Date   Allergy     Asthma    Diverticulitis    Diverticulitis 09/17/2015   Essential hypertension    Hemorrhoid    Hyperlipidemia    Kidney stones    Mild dilation of ascending aorta (HCC)    Obesity    Obstructive sleep apnea 10/16/2010   Qualifier: Diagnosis of  By: Bennetta Braun MD, Deena Farrier  Not on CPAP     Reactive depression (situational) 02/20/2020   Patient states he was never depressed, he had situational depression that is now resolved.    Seasonal allergies 08/24/2013   Swollen lymph  nodes    abd, groin,    Medical/Surgical History Narrative:  Allergic/Intolerant to: Iodine; Food Allergy  to Shellfish - nausea  2018 - had an Acute Anal Fissure seen by [facility] and treated with Topical NTG  2012 - Hemorrhoid Rubber Band Ligation  Other - Surghx of: Vasectomy in the mid-1990s Family History  Problem Relation Age of Onset   Atrial  fibrillation Mother    Stroke Mother    Diabetes Father    Heart disease Father    Heart failure Father    Cancer Brother        Melanoma- left arm   Leukemia Maternal Grandmother    Cancer Paternal Grandmother   Family History Narrative: Paternal Grandmother w/ hx of Cancer, unknown Father w/ hx of Heart Disease, Heart Failure, and Diabetes Maternal Grandmother w/ hx of Leukemia Mother w/ hx of Atrial fibrillation and Stroke Brother w/ hx of Cancer, unknown.  Social History   Social History Narrative   2025 - Married to wife, Joni Simson - she works as a Water quality scientist for [employer]. 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 1 Daughter, 2 grandchildren.   Review of Systems  Constitutional:  Negative for chills, fever, malaise/fatigue and weight loss.  HENT:  Negative for hearing loss, sinus pain and sore throat.   Respiratory:  Negative for cough, hemoptysis and shortness of breath.   Cardiovascular:  Negative for chest pain, palpitations, leg swelling and PND.  Gastrointestinal:  Negative for abdominal pain, constipation, diarrhea, heartburn, nausea and vomiting.  Genitourinary:  Negative for dysuria, frequency and urgency.  Musculoskeletal:  Negative for back pain, myalgias and neck pain.  Skin:  Negative for itching and rash.  Neurological:  Negative for dizziness, tingling, seizures and headaches.  Endo/Heme/Allergies:  Negative for polydipsia.  Psychiatric/Behavioral:  Negative for depression. The patient is not nervous/anxious.     Objective:  Vitals: BP 128/84   Pulse 95   Temp 98.5 F (36.9 C) (Temporal)   Ht 6\' 1"  (1.854 m)   Wt 277 lb 1.9 oz (125.7 kg)   SpO2 95%   BMI 36.56 kg/m  Physical Exam Vitals and nursing note reviewed.  Constitutional:      General: He is awake. He is not in acute distress.    Appearance: Normal appearance. He is not ill-appearing or toxic-appearing.  HENT:     Head: Normocephalic and  atraumatic.     Right Ear: Tympanic membrane, ear canal and external ear normal.     Left Ear: Tympanic membrane, ear canal and external ear normal.     Mouth/Throat:     Pharynx: Oropharynx is clear.  Eyes:     Extraocular Movements: Extraocular movements intact.     Pupils: Pupils are equal, round, and reactive to light.  Neck:     Thyroid : No thyroid  mass, thyromegaly or thyroid  tenderness.     Vascular: No carotid bruit.  Cardiovascular:     Rate and Rhythm: Normal rate and regular rhythm. No extrasystoles are present.    Pulses:          Dorsalis pedis pulses are 1+ on the right side and 1+ on the left side.     Heart sounds: Normal heart sounds. No murmur heard.    No friction rub. No gallop.  Pulmonary:     Effort: Pulmonary effort is normal.     Breath sounds: Normal breath sounds. No decreased breath  sounds, wheezing, rhonchi or rales.  Chest:     Chest wall: No mass.  Abdominal:     Palpations: Abdomen is soft. There is no hepatomegaly, splenomegaly or mass.     Tenderness: There is no abdominal tenderness.     Hernia: No hernia is present.  Genitourinary:    Comments: Declines prostate exam today Musculoskeletal:     Cervical back: Normal range of motion.     Right lower leg: No edema.     Left lower leg: No edema.  Lymphadenopathy:     Cervical: No cervical adenopathy.     Upper Body:     Right upper body: No supraclavicular adenopathy.     Left upper body: No supraclavicular adenopathy.  Skin:    General: Skin is warm and dry.  Neurological:     General: No focal deficit present.     Mental Status: He is alert and oriented to person, place, and time. Mental status is at baseline.     Cranial Nerves: Cranial nerves 2-12 are intact.     Sensory: Sensation is intact.     Motor: Motor function is intact.     Coordination: Coordination is intact.     Gait: Gait is intact.     Deep Tendon Reflexes: Reflexes are normal and symmetric.  Psychiatric:         Attention and Perception: Attention normal.        Mood and Affect: Mood normal.        Speech: Speech normal.        Behavior: Behavior normal. Behavior is cooperative.        Thought Content: Thought content normal.        Cognition and Memory: Cognition and memory normal.        Judgment: Judgment normal.   Most Recent Fall Risk Assessment:    02/22/2024    3:05 PM  Fall Risk   Falls in the past year? 0  Number falls in past yr: 0  Injury with Fall? 0  Risk for fall due to : No Fall Risks  Follow up Falls evaluation completed   Most Recent Depression Screenings:    07/18/2021   10:14 AM 02/20/2020   10:19 AM  PHQ 2/9 Scores  PHQ - 2 Score 0 0  PHQ- 9 Score  0   Results:  Studies Obtained And Personally Reviewed By Me:  Colonoscopy 08/2016 found Hyperplastic Rectal Polyp & Diverticulosis.  Labs:     Component Value Date/Time   NA 137 02/18/2024 0907   NA 140 12/16/2018 0931   K 4.0 02/18/2024 0907   CL 105 02/18/2024 0907   CO2 24 02/18/2024 0907   GLUCOSE 141 (H) 02/18/2024 0907   BUN 13 02/18/2024 0907   BUN 15 12/16/2018 0931   CREATININE 0.98 02/18/2024 0907   CALCIUM  9.3 02/18/2024 0907   PROT 6.8 02/18/2024 0907   PROT 6.5 12/16/2018 0931   ALBUMIN 3.9 09/23/2022 1005   ALBUMIN 4.3 12/16/2018 0931   AST 28 02/18/2024 0907   ALT 38 02/18/2024 0907   ALKPHOS 61 09/23/2022 1005   BILITOT 1.3 (H) 02/18/2024 0907   BILITOT 0.8 12/16/2018 0931   GFRNONAA >60 09/23/2022 1005   GFRNONAA 86 10/11/2020 1029   GFRAA 100 10/11/2020 1029    Lab Results  Component Value Date   WBC 6.3 02/18/2024   HGB 16.0 02/18/2024   HCT 45.7 02/18/2024   MCV 88.2 02/18/2024   PLT 253 02/18/2024  Lab Results  Component Value Date   CHOL 141 02/18/2024   HDL 39 (L) 02/18/2024   LDLCALC 85 02/18/2024   TRIG 77 02/18/2024   CHOLHDL 3.6 02/18/2024   Lab Results  Component Value Date   HGBA1C 6.3 (H) 02/18/2024    Lab Results  Component Value Date   TSH 2.11  11/20/2022    Lab Results  Component Value Date   PSA 1.43 02/18/2024   PSA 1.19 11/20/2022   PSA 1.00 07/18/2021     Assessment & Plan:  Annual physical exam completed Orders Placed This Encounter  Procedures   Pneumococcal conjugate vaccine 20-valent  Other Labs Reviewed today: CBC: WNL CMP, compared to 11/2022: Blood Glucose 141, elevated from 109; Total Bilirubin 1.3, elevated from 1.2; otherwise WNL.    Hypertension treated with Metoprolol  tartrate 12.5 mg twice daily. Blood Pressure: normotensive today at 128/84.    Hyperlipidemia treated with  Rosuvastatin  5 mg daily. 02/18/2024 Lipid Panel: HDL 39, decreased from previous trends of 41-54; otherwise WNL. LDL has been elevated in the past, most recently 2024, from 100 to 152; Cholesterol was last elevated in 2019, has been 201 - 222; Triglycerides have been elevated once in 2019 at 179.   HTN & Hyperlipidemia followed by Dr. Ahmad Alert, Cardiologist.    Impaired Glucose Tolerance 02/18/2024 Glucose 141, elevated from 109 in 2024. Has been consistently elevated from 101 - 129 from 2020 - 2023, except in 2022 when it was 93. HgbA1c 6.3, elevated from 5.9 in 11/2022, elevated from previous trends of 5.5 in 2022, 5.1 in 2019, 5.6 in 2016, was elevated at 5.7 in 2015, and was 5.5 in 2014. Says that he has started to work on his diet and exercise with a goal of 222 pounds-- he would like to check back on this in 6 months.   Obesity; BMI since 2012 his weight has fluctuated from 267, BMI 37.24 to his weight today of 277, BMI 36.56. His highest weight has been 284, BMI 38.52 with his lowest being 244, BMI 32.19 but has generally remained consistent around 279, BMI 36.81. David Fuller  Obstructive Sleep Apnea treated with CPAP device.  Asthma; Seasonal Allergies managed with Flonase .  Vitamin Deficiencies, D and B12, treated with Vitamin-D 5,000 units daily and PO Vitamin-B12 5,000 mcg weekly.   Colonoscopy 08/2016 found Hyperplastic Rectal Polyp &  Diverticulosis with repeat recommendation of 2027.  PSA  1.43  02/18/2024  Vaccine Counseling: Due for Flu, Shingles 1/2, and PNA - discussed, he is agreeable to receiving PNA today, postpones Shingrix, and declines Covid-19; UTD on Flu and Tdap. Return in about 6 months (around 08/23/2024) for A1c recheck.   Annual wellness visit done today including the all of the following: Reviewed patient's Family Medical History Reviewed and updated list of patient's medical providers Assessment of cognitive impairment was done Assessed patient's functional ability Established a written schedule for health screening services Health Risk Assessent Completed and Reviewed  Discussed health benefits of physical activity, and encouraged him to engage in regular exercise appropriate for his age and condition.    I,Emily Lagle,acting as a Neurosurgeon for Sylvan Evener, MD.,have documented all relevant documentation on the behalf of Sylvan Evener, MD,as directed by  Sylvan Evener, MD while in the presence of Sylvan Evener, MD.   I, Sylvan Evener, MD, have reviewed all documentation for this visit. The documentation on 03/03/24 for the exam, diagnosis, procedures, and orders are all accurate and complete.

## 2024-03-03 ENCOUNTER — Encounter: Payer: Self-pay | Admitting: Internal Medicine

## 2024-03-03 NOTE — Patient Instructions (Signed)
 It was a pleasure to see you today. Please return on 6 months.

## 2024-03-25 ENCOUNTER — Other Ambulatory Visit: Payer: Self-pay

## 2024-03-25 ENCOUNTER — Encounter: Payer: Self-pay | Admitting: Internal Medicine

## 2024-03-25 ENCOUNTER — Emergency Department (HOSPITAL_COMMUNITY)
Admission: EM | Admit: 2024-03-25 | Discharge: 2024-03-25 | Disposition: A | Discharge status: Home / Self Care | Attending: Emergency Medicine | Admitting: Emergency Medicine

## 2024-03-25 ENCOUNTER — Encounter (HOSPITAL_COMMUNITY): Payer: Self-pay | Admitting: *Deleted

## 2024-03-25 ENCOUNTER — Telehealth: Payer: Self-pay | Admitting: Internal Medicine

## 2024-03-25 ENCOUNTER — Emergency Department (HOSPITAL_COMMUNITY)

## 2024-03-25 ENCOUNTER — Ambulatory Visit: Admitting: Internal Medicine

## 2024-03-25 DIAGNOSIS — K5732 Diverticulitis of large intestine without perforation or abscess without bleeding: Secondary | ICD-10-CM | POA: Diagnosis not present

## 2024-03-25 DIAGNOSIS — K5792 Diverticulitis of intestine, part unspecified, without perforation or abscess without bleeding: Secondary | ICD-10-CM

## 2024-03-25 DIAGNOSIS — R1032 Left lower quadrant pain: Secondary | ICD-10-CM | POA: Diagnosis present

## 2024-03-25 LAB — URINALYSIS, W/ REFLEX TO CULTURE (INFECTION SUSPECTED)
Bacteria, UA: NONE SEEN
Bilirubin Urine: NEGATIVE
Glucose, UA: NEGATIVE mg/dL
Hgb urine dipstick: NEGATIVE
Ketones, ur: NEGATIVE mg/dL
Nitrite: NEGATIVE
Protein, ur: NEGATIVE mg/dL
Specific Gravity, Urine: 1.018 (ref 1.005–1.030)
pH: 6 (ref 5.0–8.0)

## 2024-03-25 LAB — CBC WITH DIFFERENTIAL/PLATELET
Abs Immature Granulocytes: 0.04 10*3/uL (ref 0.00–0.07)
Basophils Absolute: 0.1 10*3/uL (ref 0.0–0.1)
Basophils Relative: 0 %
Eosinophils Absolute: 0.2 10*3/uL (ref 0.0–0.5)
Eosinophils Relative: 2 %
HCT: 52.5 % — ABNORMAL HIGH (ref 39.0–52.0)
Hemoglobin: 17.9 g/dL — ABNORMAL HIGH (ref 13.0–17.0)
Immature Granulocytes: 0 %
Lymphocytes Relative: 5 %
Lymphs Abs: 0.7 10*3/uL (ref 0.7–4.0)
MCH: 30.7 pg (ref 26.0–34.0)
MCHC: 34.1 g/dL (ref 30.0–36.0)
MCV: 89.9 fL (ref 80.0–100.0)
Monocytes Absolute: 1.2 10*3/uL — ABNORMAL HIGH (ref 0.1–1.0)
Monocytes Relative: 9 %
Neutro Abs: 11.2 10*3/uL — ABNORMAL HIGH (ref 1.7–7.7)
Neutrophils Relative %: 84 %
Platelets: 253 10*3/uL (ref 150–400)
RBC: 5.84 MIL/uL — ABNORMAL HIGH (ref 4.22–5.81)
RDW: 12.2 % (ref 11.5–15.5)
WBC: 13.5 10*3/uL — ABNORMAL HIGH (ref 4.0–10.5)
nRBC: 0 % (ref 0.0–0.2)

## 2024-03-25 LAB — COMPREHENSIVE METABOLIC PANEL WITH GFR
ALT: 41 U/L (ref 0–44)
AST: 30 U/L (ref 15–41)
Albumin: 4.3 g/dL (ref 3.5–5.0)
Alkaline Phosphatase: 82 U/L (ref 38–126)
Anion gap: 12 (ref 5–15)
BUN: 12 mg/dL (ref 8–23)
CO2: 22 mmol/L (ref 22–32)
Calcium: 9.1 mg/dL (ref 8.9–10.3)
Chloride: 101 mmol/L (ref 98–111)
Creatinine, Ser: 0.91 mg/dL (ref 0.61–1.24)
GFR, Estimated: >60 mL/min (ref >60)
Glucose, Bld: 141 mg/dL — ABNORMAL HIGH (ref 70–99)
Potassium: 4 mmol/L (ref 3.5–5.1)
Sodium: 135 mmol/L (ref 135–145)
Total Bilirubin: 2.3 mg/dL — ABNORMAL HIGH (ref 0.0–1.2)
Total Protein: 7.8 g/dL (ref 6.5–8.1)

## 2024-03-25 NOTE — Telephone Encounter (Addendum)
 Patient seen recently at [facility] for abdominal pain. Diagnosed with acute diverticulitis. Was placed on oral Flagyl  and Cipro . Symptoms have not improved. No records available from [place]. Hx of descending and colonic diverticulosis per CT Nov 2023. Records say he had colonoscopy 2017 but not sure where. Says next study is due 2027. He is new from Dr. Melene Sportsman practice who recently passed away.  Records say episodes date back at least to 2015. I do see a CT with acute diverticulitis in 2011. Also has hx of retroperitoneal lymphadenopathy and hx of left nephrolithiasis. Dr. Andriette Keeling did colonoscopy 2017.  Dr. Andriette Keeling  has retired. At that time, patient had hyperplastic polyp and diverticulosis. Also hx of hemorrhoid banding  in 2012.   Today he is having acute abdominal pain and was referred to ED. He lives near [place] and went there where he was seen dr ED physician and had CT of abdomen/pelvis showing acute sigmoid diverticulitis. No perforation.   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 Fatty liver noted.  Spoke with pt by phone at 4:50pm. He is now at home.  Patient should stay on clear liquids for 24-48 hours then advance to full liquids for 24-48 hours. We can see him next week in office on Tuesday.  Today WBC is 13,500 Hgb 17.9. LE trace. Total bili 2.3, ALT 41, AST 30 Alk phos 82  Aneurysm will need to be monitored periodically by scanning.  Pt has had this intermittently for about 15 years he says. Pt has pain with standing.   Pt says follow up with scheduled with Cardiologist in about 5 months and he would like to discuss aneurysm at that time.  MJB, MD

## 2024-03-25 NOTE — Discharge Instructions (Signed)
 Return for any problem.  ?

## 2024-03-25 NOTE — ED Triage Notes (Signed)
 Here by POV from home for LLQ pain, relates to "likely diverticulitis. H/o same. Denies bleeding, NVD, fever or urinary sx. Onset 1700 Wednesday. Minimal pain at rest, worse when walking or moving. Alert, NAD, calm, interactive, resps e/u, steady gait.

## 2024-03-25 NOTE — ED Provider Notes (Signed)
 [place] EMERGENCY DEPARTMENT AT [facility] Provider Note   CSN: [id number] Arrival date & time: 03/25/24  1040     History  Chief Complaint  Patient presents with   Abdominal Pain    David Fuller is a 63 y.o. male.  63 year old male with prior medical history as detailed below presents for evaluation.  Patient reports a longstanding history of diverticulitis.  Patient reports onset of left lower quadrant pain / left flank pain 2 days ago.  Patient was seen in the outpatient setting and started on antibiotics to treat presumed diverticulitis.  Patient is presenting today with request for CT imaging to make sure that he does not have complications related to presumed diverticulitis.  Patient denies fever.  He denies nausea or vomiting.  He denies diarrhea.  He denies melena.  He denies other complaint.  He reports that since starting oral antibiotics a day and a half ago he feels improved.  The history is provided by the patient.       Home Medications Prior to Admission medications   Medication Sig Start Date End Date Taking? Authorizing Provider  albuterol  (VENTOLIN  HFA) 108 (90 Base) MCG/ACT inhaler Inhale 2 puffs into the lungs every 6 (six) hours as needed for wheezing or shortness of breath. Patient not taking: Reported on 02/22/2024 12/30/23   Webb, Padonda B, FNP  Cetirizine HCl 10 MG CAPS Take 1 tablet by mouth daily as needed.    [provider]  Cholecalciferol (VITAMIN D3) 125 MCG (5000 UT) TABS Take 1 tablet (5,000 Units total) by mouth daily. 07/19/21   [place] Bureau, NP  fluticasone  (FLONASE ) 50 MCG/ACT nasal spray Place 2 sprays into both nostrils as needed for allergies. 06/17/16   Jinny Mounts, PA-C  Magnesium  400 MG CAPS Take 1 tablet by mouth daily. 07/19/21   [place] Bureau, NP  Methylcobalamin (B-12) 5000 MCG TBDP Take 1 tablet by mouth once a week. 07/19/21   [place] Bureau, NP  metoprolol  tartrate (LOPRESSOR ) 25 MG tablet Take 1/2  tablet twice daily (every 12 hours) for blood pressure and heart rhythm. 05/04/23   Cranford, Tonya, NP  rosuvastatin  (CRESTOR ) 5 MG tablet TAKE 1 TABLET ONCE A DAY TO CONTROL CHOLESTEROL. 05/04/23   Langley Pippin, NP  vitamin C  (ASCORBIC ACID) 500 MG tablet Take 1 tablet (500 mg total) by mouth daily. 07/19/21   [place] Bureau, NP  zinc gluconate 50 MG tablet Take 50 mg by mouth daily.    [provider]      Allergies    Shellfish allergy  and Iodine    Review of Systems   Review of Systems  All other systems reviewed and are negative.   Physical Exam Updated Vital Signs BP (!) 150/103 (BP Location: Right Arm)   Pulse 96   Temp 99 F (37.2 C) (Oral)   Resp 18   Wt 126.1 kg   SpO2 98%   BMI 36.68 kg/m  Physical Exam Vitals and nursing note reviewed.  Constitutional:      General: He is not in acute distress.    Appearance: Normal appearance. He is well-developed.  HENT:     Head: Normocephalic and atraumatic.  Eyes:     Conjunctiva/sclera: Conjunctivae normal.     Pupils: Pupils are equal, round, and reactive to light.  Cardiovascular:     Rate and Rhythm: Normal rate and regular rhythm.     Heart sounds: Normal heart sounds.  Pulmonary:     Effort: Pulmonary effort  is normal. No respiratory distress.     Breath sounds: Normal breath sounds.  Abdominal:     General: There is no distension.     Palpations: Abdomen is soft.     Tenderness: There is no abdominal tenderness.  Musculoskeletal:        General: No deformity. Normal range of motion.     Cervical back: Normal range of motion and neck supple.  Skin:    General: Skin is warm and dry.  Neurological:     General: No focal deficit present.     Mental Status: He is alert and oriented to person, place, and time.     ED Results / Procedures / Treatments   Labs (all labs ordered are listed, but only abnormal results are displayed) Labs Reviewed  COMPREHENSIVE METABOLIC PANEL WITH GFR  CBC WITH  DIFFERENTIAL/PLATELET  URINALYSIS, W/ REFLEX TO CULTURE (INFECTION SUSPECTED)    EKG None  Radiology No results found.  Procedures Procedures    Medications Ordered in ED Medications - No data to display  ED Course/ Medical Decision Making/ A&P                                 Medical Decision Making Amount and/or Complexity of Data Reviewed Labs: ordered. Radiology: ordered.    Medical Screen Complete  This patient presented to the ED with complaint of left lower quadrant abdominal pain.  This complaint involves an extensive number of treatment options. The initial differential diagnosis includes, but is not limited to, diverticulitis  This presentation is: Acute, Self-Limited, Previously Undiagnosed, Uncertain Prognosis, Complicated, Systemic Symptoms, and Threat to Life/Bodily Function  Patient is presenting with request for CT imaging.  Patient with history of diverticulitis.  Patient started on antibiotics 2 days ago for treatment of presumed diverticulitis.  Patient wants CT imaging to confirm diagnosis.  He is actually feeling better after starting antibiotics.  CT imaging shows evidence of diverticulitis without complication.  All results of labs and CT imaging discussed with the patient.  He understands need for close outpatient follow-up.  Patient feels reassured after workup findings.  Importance of close follow-up stressed.  Strict return precautions given and understood.  Additional history obtained:  External records from outside sources obtained and reviewed including prior ED visits and prior Inpatient records.    Problem List / ED Course:  Diverticulitis   Reevaluation:  After the interventions noted above, I reevaluated the patient and found that they have: improved  Disposition:  After consideration of the diagnostic results and the patients response to treatment, I feel that the patent would benefit from close outpatient followup.           Final Clinical Impression(s) / ED Diagnoses Final diagnoses:  Diverticulitis    Rx / DC Orders ED Discharge Orders     None         Burnette Carte, MD 03/25/24 [phone]

## 2024-03-28 NOTE — Progress Notes (Signed)
 Patient Care Team: Sylvan Evener, MD as PCP - General (Internal Medicine) Nahser, Lela Purple, MD as PCP - Cardiology (Cardiology) Jacqueline Matsu, MD as PCP - Sleep Medicine (Cardiology) Eduardo Grade, MD as Consulting Physician (Dermatology) Serafin Dames, MD as Consulting Physician (Gastroenterology) Amedeo Jupiter, MD as Consulting Physician (Ophthalmology)  Visit Date: 03/29/24  Subjective:   Chief Complaint  Patient presents with   Hospitalization Follow-up  Patient WU:David Fuller DOB:[birth year],63 y.o. MRN:[id number]   63 y.o.Male presents today for ED follow-up from 03/25/2024 for Diverticulitis. Patient has a past medical history of Colonic & Descending Diverticulosis; Hemorrhoids; Retroperitoneal Lymphadenopathy; Left Nephrolithiasis; Colonoscopy by Dr. Andriette Keeling 2017; Hyperplastic Polyp(s); Hemorrhoids Banding 2012. On 5/16 he'd initially made an appointment to be seen here in the office, however with his acute abdominal pain and extensive GI history it was recommended he go to the ED for a CT Abdomen/Pelvis. According to the telephone note his imaging noted acute sigmoid diverticulitis w/o perforation, a new finding of a 3 cm distal aortic aneurysm (recommended periodic monitoring every 3 years), and fatty liver. His abnormal lab results included WBC 13.5; Hgb 17.9; Total Bili 2.3; ALT 41; AST 30; AlkPhos 82. He was instructed to stay on clear liquids for 24- 48 hours before advancing to full liquids for another 24 - 48 hours. During the phone call he said regarding his diverticulitis he's had this intermittently x 15 years and has pain with standing.  Today he says that he is feeling mostly improved (about 99%), though does still feel some tugging pain when standing; does still have a couple of days left of his antibiotics. Says that he's been researching diet management for his diverticulosis, has already cut out sodas, but plans to start incorporating diet changes  and exercising more. Regarding his Aortic Aneurysm he says that he see Cardiology annually, so when he next sees them will mention this to make them aware.  Past Medical History:  Diagnosis Date   Allergy     Asthma    Diverticulitis    Diverticulitis 09/17/2015   Essential hypertension    Hemorrhoid    Hyperlipidemia    Kidney stones    Mild dilation of ascending aorta (HCC)    Obesity    Obstructive sleep apnea 10/16/2010   Qualifier: Diagnosis of  By: Bennetta Braun MD, Deena Farrier  Not on CPAP     Reactive depression (situational) 02/20/2020   Patient states he was never depressed, he had situational depression that is now resolved.    Seasonal allergies 08/24/2013   Swollen lymph nodes    abd, groin,     Allergies  Allergen Reactions   Shellfish Allergy  Nausea Only   Iodine Other (See Comments)    Unknown Unknown Allergic to shellfish - nausea    Family History  Problem Relation Age of Onset   Atrial fibrillation Mother    Stroke Mother    Diabetes Father    Heart disease Father    Heart failure Father    Cancer Brother        Melanoma- left arm   Leukemia Maternal Grandmother    Cancer Paternal Grandmother    Social History   Social History Narrative   2025 - Married to wife, Joni Lampi - she works as a Water quality scientist for [employer]. 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 1 Daughter, 2 grandchildren.   Review of Systems  Gastrointestinal:  Positive for abdominal pain (mild,  when standing).     Objective:  Vitals: Ht 6\' 1"  (1.854 m)   BMI 36.68 kg/m   Physical Exam Vitals and nursing note reviewed.  Constitutional:      General: He is not in acute distress.    Appearance: Normal appearance. He is not ill-appearing.  HENT:     Head: Normocephalic and atraumatic.  Cardiovascular:     Pulses:          Dorsalis pedis pulses are 2+ on the right side and 2+ on the left side.  Pulmonary:     Effort: Pulmonary effort is normal.   Abdominal:     Tenderness: There is no abdominal tenderness.  Skin:    General: Skin is warm and dry.  Neurological:     Mental Status: He is alert and oriented to person, place, and time. Mental status is at baseline.  Psychiatric:        Mood and Affect: Mood normal.        Behavior: Behavior normal.        Thought Content: Thought content normal.        Judgment: Judgment normal.     Results:  Studies Obtained And Personally Reviewed By Me:  CT ABDOMEN AND PELVIS WITHOUT CONTRAST 03/25/2024  COMPARISON:  CT abdomen pelvis dated 09/24/2022.   FINDINGS: Evaluation of this exam is limited in the absence of intravenous contrast.   Lower chest: The visualized lung bases are clear.   No intra-abdominal free air or free fluid.   Hepatobiliary: Fatty liver. No biliary dilatation. The gallbladder is unremarkable.   Pancreas: Unremarkable. No pancreatic ductal dilatation or surrounding inflammatory changes.   Spleen: Normal in size without focal abnormality.   Adrenals/Urinary Tract: The adrenal glands unremarkable. There is no hydronephrosis or nephrolithiasis on either side. The visualized ureters and urinary bladder appear unremarkable.   Stomach/Bowel: There is sigmoid diverticulosis. There is inflammatory changes of the sigmoid diverticula in the left hemipelvis consistent with acute diverticulitis. No diverticular abscess or perforation. There is no bowel obstruction. Appendectomy.   Vascular/Lymphatic: There is a 3 cm distal aortic aneurysm. The IVC is unremarkable no portal venous gas. There is no adenopathy.   Reproductive: The prostate and seminal vesicles are grossly unremarkable. No pelvic mass.   Other: None   Musculoskeletal: L5-S1 disc desiccation and vacuum phenomena. No acute osseous pathology.   IMPRESSION: 1. Acute sigmoid diverticulitis. No diverticular abscess or perforation.  2. Fatty liver.  3. A 3 cm distal aortic aneurysm. Recommend  follow-up ultrasound every 3 years.    Labs:     Component Value Date/Time   NA 135 03/25/2024 1113   NA 140 12/16/2018 0931   K 4.0 03/25/2024 1113   CL 101 03/25/2024 1113   CO2 22 03/25/2024 1113   GLUCOSE 141 (H) 03/25/2024 1113   BUN 12 03/25/2024 1113   BUN 15 12/16/2018 0931   CREATININE 0.91 03/25/2024 1113   CREATININE 0.98 02/18/2024 0907   CALCIUM  9.1 03/25/2024 1113   PROT 7.8 03/25/2024 1113   PROT 6.5 12/16/2018 0931   ALBUMIN 4.3 03/25/2024 1113   ALBUMIN 4.3 12/16/2018 0931   AST 30 03/25/2024 1113   ALT 41 03/25/2024 1113   ALKPHOS 82 03/25/2024 1113   BILITOT 2.3 (H) 03/25/2024 1113   BILITOT 0.8 12/16/2018 0931   GFRNONAA >60 03/25/2024 1113   GFRNONAA 86 10/11/2020 1029   GFRAA 100 10/11/2020 1029    Lab Results  Component Value Date   WBC  13.5 (H) 03/25/2024   HGB 17.9 (H) 03/25/2024   HCT 52.5 (H) 03/25/2024   MCV 89.9 03/25/2024   PLT 253 03/25/2024   Lab Results  Component Value Date   CHOL 141 02/18/2024   HDL 39 (L) 02/18/2024   LDLCALC 85 02/18/2024   TRIG 77 02/18/2024   CHOLHDL 3.6 02/18/2024   Lab Results  Component Value Date   HGBA1C 6.3 (H) 02/18/2024    Lab Results  Component Value Date   TSH 2.11 11/20/2022    Lab Results  Component Value Date   PSA 1.43 02/18/2024   PSA 1.19 11/20/2022   PSA 1.00 07/18/2021     Assessment & Plan:   ED follow-up from 03/25/2024 for Diverticulitis: history of Colonic & Descending Diverticulosis; Hemorrhoids; Retroperitoneal Lymphadenopathy; Left Nephrolithiasis; Colonoscopy by Dr. Andriette Keeling 2017; Hyperplastic Polyp(s); Hemorrhoids Banding 2012. Seen in [facility] for a CT Abdomen/Pelvis due to his abdominal pain. Imaging noted acute sigmoid diverticulitis w/o perforation. Abnormal lab results included WBC 13.5; Hgb 17.9; Total Bili 2.3; ALT 41; AST 30; AlkPhos 82. He was instructed to stay on clear liquids for 24- 48 hours before advancing to full liquids for another 24 - 48 hours. During  the phone call he said regarding his diverticulitis he's had this intermittently x 15 years and has pain with standing. Today says that he is feeling mostly improved (about 99%), though does still feel some tugging pain when standing; does still have a couple of days left of his antibiotics. Had been researching diet management for his diverticulosis, has already cut out sodas, but plans to start incorporating diet changes and exercising more. Advance diet slowly over next week.  Aortic Aneurysm 3 cm distal aortic aneurysm noted on CT Abdomen/Pelvis as a new finding. He sees Cardiology annually, so when he next sees them will mention this to make them aware. Suggest annual ultrasound.    I,Emily Lagle,acting as a Neurosurgeon for Sylvan Evener, MD.,have documented all relevant documentation on the behalf of Sylvan Evener, MD,as directed by  Sylvan Evener, MD while in the presence of Sylvan Evener, MD.   I, Sylvan Evener, MD, have reviewed all documentation for this visit. The documentation on 03/29/24 for the exam, diagnosis, procedures, and orders are all accurate and complete.

## 2024-03-29 ENCOUNTER — Ambulatory Visit: Admitting: Internal Medicine

## 2024-03-29 ENCOUNTER — Encounter: Payer: Self-pay | Admitting: Internal Medicine

## 2024-03-29 VITALS — Ht 73.0 in

## 2024-03-29 DIAGNOSIS — K5792 Diverticulitis of intestine, part unspecified, without perforation or abscess without bleeding: Secondary | ICD-10-CM

## 2024-03-29 DIAGNOSIS — I719 Aortic aneurysm of unspecified site, without rupture: Secondary | ICD-10-CM | POA: Diagnosis not present

## 2024-03-29 NOTE — Patient Instructions (Addendum)
 Coming back in October to follow up on Hgb AIC. We will plan on annual ultrasound imaging each Spring for assessment of size of descending aortic aneurysm. Encourage exercise to raise HDL. Continue current meds. Finish antibiotics. Suggest soft diet for 2 weeks.

## 2024-04-28 ENCOUNTER — Other Ambulatory Visit: Payer: Self-pay

## 2024-04-28 ENCOUNTER — Encounter: Payer: Self-pay | Admitting: Internal Medicine

## 2024-04-28 DIAGNOSIS — I10 Essential (primary) hypertension: Secondary | ICD-10-CM

## 2024-04-28 DIAGNOSIS — E785 Hyperlipidemia, unspecified: Secondary | ICD-10-CM

## 2024-04-28 DIAGNOSIS — I493 Ventricular premature depolarization: Secondary | ICD-10-CM

## 2024-04-28 MED ORDER — ROSUVASTATIN CALCIUM 5 MG PO TABS: ORAL_TABLET | ORAL | 3 refills | Status: AC

## 2024-04-28 MED ORDER — METOPROLOL TARTRATE 25 MG PO TABS: ORAL_TABLET | ORAL | 3 refills | Status: AC

## 2024-07-18 ENCOUNTER — Emergency Department (HOSPITAL_COMMUNITY)
Admission: EM | Admit: 2024-07-18 | Discharge: 2024-07-18 | Disposition: A | Discharge status: Home / Self Care | Attending: Emergency Medicine | Admitting: Emergency Medicine

## 2024-07-18 ENCOUNTER — Emergency Department (HOSPITAL_COMMUNITY)

## 2024-07-18 DIAGNOSIS — Z7951 Long term (current) use of inhaled steroids: Secondary | ICD-10-CM | POA: Diagnosis not present

## 2024-07-18 DIAGNOSIS — I10 Essential (primary) hypertension: Secondary | ICD-10-CM | POA: Insufficient documentation

## 2024-07-18 DIAGNOSIS — R61 Generalized hyperhidrosis: Secondary | ICD-10-CM | POA: Insufficient documentation

## 2024-07-18 DIAGNOSIS — Z79899 Other long term (current) drug therapy: Secondary | ICD-10-CM | POA: Insufficient documentation

## 2024-07-18 DIAGNOSIS — R002 Palpitations: Secondary | ICD-10-CM | POA: Insufficient documentation

## 2024-07-18 DIAGNOSIS — J45909 Unspecified asthma, uncomplicated: Secondary | ICD-10-CM | POA: Insufficient documentation

## 2024-07-18 DIAGNOSIS — R110 Nausea: Secondary | ICD-10-CM | POA: Insufficient documentation

## 2024-07-18 LAB — I-STAT CHEM 8, ED
BUN: 5 mg/dL — ABNORMAL LOW (ref 8–23)
Calcium, Ion: 1.23 mmol/L (ref 1.15–1.40)
Chloride: 99 mmol/L (ref 98–111)
Creatinine, Ser: 0.8 mg/dL (ref 0.61–1.24)
Glucose, Bld: 114 mg/dL — ABNORMAL HIGH (ref 70–99)
HCT: 46 % (ref 39.0–52.0)
Hemoglobin: 15.6 g/dL (ref 13.0–17.0)
Potassium: 3.4 mmol/L — ABNORMAL LOW (ref 3.5–5.1)
Sodium: 140 mmol/L (ref 135–145)
TCO2: 29 mmol/L (ref 22–32)

## 2024-07-18 LAB — CBC
HCT: 45.2 % (ref 39.0–52.0)
Hemoglobin: 15.8 g/dL (ref 13.0–17.0)
MCH: 30.6 pg (ref 26.0–34.0)
MCHC: 35 g/dL (ref 30.0–36.0)
MCV: 87.4 fL (ref 80.0–100.0)
Platelets: 215 K/uL (ref 150–400)
RBC: 5.17 MIL/uL (ref 4.22–5.81)
RDW: 12.6 % (ref 11.5–15.5)
WBC: 7.7 K/uL (ref 4.0–10.5)
nRBC: 0 % (ref 0.0–0.2)

## 2024-07-18 LAB — TROPONIN I (HIGH SENSITIVITY)
Troponin I (High Sensitivity): 4 ng/L (ref <18)
Troponin I (High Sensitivity): 5 ng/L (ref <18)

## 2024-07-18 LAB — BASIC METABOLIC PANEL WITH GFR
Anion gap: 9 (ref 5–15)
BUN: 13 mg/dL (ref 8–23)
CO2: 25 mmol/L (ref 22–32)
Calcium: 9.1 mg/dL (ref 8.9–10.3)
Chloride: 102 mmol/L (ref 98–111)
Creatinine, Ser: 1.1 mg/dL (ref 0.61–1.24)
GFR, Estimated: >60 mL/min (ref >60)
Glucose, Bld: 139 mg/dL — ABNORMAL HIGH (ref 70–99)
Potassium: 3.6 mmol/L (ref 3.5–5.1)
Sodium: 136 mmol/L (ref 135–145)

## 2024-07-18 NOTE — Discharge Instructions (Addendum)
 Continue all medications.  I would try to cut back on caffeine intake to see if this helps. Follow-up with cardiology-- call office to get this set up. Return to the ED for new or worsening symptoms.

## 2024-07-18 NOTE — ED Provider Notes (Signed)
 [facility] AT [facility] Provider Note   CSN: [id number] Arrival date & time: 07/18/24  0000     Patient presents with: Palpitations   David Fuller is a 63 y.o. male.   The history is provided by the patient and medical records.  Palpitations  63 year old male with history of hyperlipidemia, hypertension, seasonal allergies, obesity, asthma, sleep apnea, history of PVCs, presenting to the ED with palpitations.  Patient states about 30 minutes prior to arrival he was getting ready for bed when he started to feel like he was injected with epinephrine and had episode of heart racing, diaphoresis, and feeling a bit nauseated.  He did not have any vomiting.  This seemed to resolve shortly after.  He did not have any chest pain.  No abdominal pain.  Does admit to some increased caffeine intake recently and has been slacking on his exercise.  He is on a low-dose beta-blocker.  Previously followed by Dr. Alveta but he has since retired.  Prior to Admission medications   Medication Sig Start Date End Date Taking? Authorizing Provider  Cetirizine HCl 10 MG CAPS Take 1 tablet by mouth daily as needed.    [provider]  Cholecalciferol (VITAMIN D3) 125 MCG (5000 UT) TABS Take 1 tablet (5,000 Units total) by mouth daily. 07/19/21   Jeanine Knee, NP  fluticasone  (FLONASE ) 50 MCG/ACT nasal spray Place 2 sprays into both nostrils as needed for allergies. 06/17/16   Aniceto Pfeiffer, PA-C  Magnesium  400 MG CAPS Take 1 tablet by mouth daily. 07/19/21   Jeanine Knee, NP  Methylcobalamin (B-12) 5000 MCG TBDP Take 1 tablet by mouth once a week. 07/19/21   Jeanine Knee, NP  metoprolol  tartrate (LOPRESSOR ) 25 MG tablet Take 1/2 tablet twice daily (every 12 hours) for blood pressure and heart rhythm. 04/28/24   Perri Ronal PARAS, MD  rosuvastatin  (CRESTOR ) 5 MG tablet TAKE 1 TABLET ONCE A DAY TO CONTROL CHOLESTEROL. 04/28/24   Perri Ronal PARAS, MD  vitamin C  (ASCORBIC  ACID) 500 MG tablet Take 1 tablet (500 mg total) by mouth daily. 07/19/21   Jeanine Knee, NP  zinc gluconate 50 MG tablet Take 50 mg by mouth daily.    [provider]    Allergies: Shellfish allergy  and Iodine    Review of Systems  Cardiovascular:  Positive for palpitations.  All other systems reviewed and are negative.   Updated Vital Signs BP (!) 140/81   Pulse 78   Temp (!) 97.5 F (36.4 C)   Resp 14   SpO2 98%   Physical Exam Vitals and nursing note reviewed.  Constitutional:      Appearance: He is well-developed.  HENT:     Head: Normocephalic and atraumatic.  Eyes:     Conjunctiva/sclera: Conjunctivae normal.     Pupils: Pupils are equal, round, and reactive to light.  Cardiovascular:     Rate and Rhythm: Normal rate and regular rhythm.     Heart sounds: Normal heart sounds.  Pulmonary:     Effort: Pulmonary effort is normal.     Breath sounds: Normal breath sounds.  Abdominal:     General: Bowel sounds are normal.     Palpations: Abdomen is soft.     Tenderness: There is no abdominal tenderness. There is no rebound.  Musculoskeletal:        General: Normal range of motion.     Cervical back: Normal range of motion.  Skin:    General:  Skin is warm and dry.  Neurological:     Mental Status: He is alert and oriented to person, place, and time.     (all labs ordered are listed, but only abnormal results are displayed) Labs Reviewed  BASIC METABOLIC PANEL WITH GFR - Abnormal; Notable for the following components:      Result Value   Glucose, Bld 139 (*)    All other components within normal limits  I-STAT CHEM 8, ED - Abnormal; Notable for the following components:   Potassium 3.4 (*)    BUN 5 (*)    Glucose, Bld 114 (*)    All other components within normal limits  CBC  TROPONIN I (HIGH SENSITIVITY)  TROPONIN I (HIGH SENSITIVITY)    EKG: EKG Interpretation Date/Time:  Monday July 18 2024 00:15:00 EDT Ventricular Rate:  90 PR  Interval:  224 QRS Duration:  98 QT Interval:  370 QTC Calculation: 452 R Axis:   50  Text Interpretation: Sinus rhythm with 1st degree A-V block Incomplete right bundle branch block Borderline ECG When compared with ECG of 16-Aug-2017 20:14, PREVIOUS ECG IS PRESENT Confirmed by Theadore Sharper [phone]) on 07/18/2024 3:46:39 AM  Radiology: [facility] Chest 2 View Result Date: 07/18/2024 EXAM: 2 VIEW(S) XRAY OF THE CHEST 07/18/2024 12:24:44 AM COMPARISON: 12/31/23 CLINICAL HISTORY: CP. having palpitations and feeling tachycardic. Pt reports he felt a radial pulse and noticed it faster than usual. He also felt diaphoretic and nauseas when the palpitations began. FINDINGS: LUNGS AND PLEURA: No focal pulmonary opacity. No pulmonary edema. No pleural effusion. No pneumothorax. HEART AND MEDIASTINUM: No acute abnormality of the cardiac and mediastinal silhouettes. BONES AND SOFT TISSUES: No acute osseous abnormality. IMPRESSION: 1. No acute process. Electronically signed by: Norman Gatlin MD 07/18/2024 12:31 AM EDT RP Workstation: [id number]     Procedures   Medications Ordered in the ED - No data to display                                  Medical Decision Making Amount and/or Complexity of Data Reviewed Labs: ordered. Radiology: ordered and independent interpretation performed. ECG/medicine tests: ordered and independent interpretation performed.   63 year old male presenting to the ED after episode of palpitations, diaphoresis, and brief nausea.  Resolved prior to arrival, currently asymptomatic.  Does admit to some increased caffeine intake today and has had palpitations with high caffeine intake previously.  Afebrile, nontoxic in appearance here.  EKG with normal sinus rhythm, remains in a regular rhythm on monitoring as well.  Labs are grossly reassuring without leukocytosis or electrolyte derangement.  Initial troponin is normal.  Chest x-ray is clear.  Given episode of diaphoresis associated, will  obtain delta troponin.  3:50 AM Continues to feel well here.  Delta Cam is negative.  Remains in normal sinus rhythm.  Given reassuring workup, low suspicion for ACS, PE, dissection, other acute cardiac event.  Difficult to discern if he may have had a brief arrhythmia earlier.  Does admit to some increased caffeine intake, has had noted palpitations and tachycardia with this previously.  Encouraged to cut down on this and arrange close office follow-up with cardiology.  He can return here for any new or acute changes.  Final diagnoses:  Palpitations    ED Discharge Orders     None          Jarold Olam HERO, PA-C 07/18/24 0402    Theadore Sharper HERO,  MD 07/18/24 [phone]

## 2024-07-18 NOTE — ED Triage Notes (Signed)
 Pt states that approximately 30 min PTA he began having palpitations and feeling tachycardic. Pt reports he felt a radial pulse and noticed it faster than usual. He also felt diaphoretic and nauseas when the palpitations began. No meds given PTA. Pt denies hx of afib, no blood thinner use.

## 2024-07-22 ENCOUNTER — Encounter: Payer: Self-pay | Admitting: Internal Medicine

## 2024-07-22 ENCOUNTER — Ambulatory Visit: Admitting: Internal Medicine

## 2024-07-22 VITALS — BP 150/88 | HR 80 | Temp 98.0°F | Ht 73.0 in | Wt 275.8 lb

## 2024-07-22 DIAGNOSIS — I10 Essential (primary) hypertension: Secondary | ICD-10-CM

## 2024-07-22 DIAGNOSIS — R002 Palpitations: Secondary | ICD-10-CM | POA: Diagnosis not present

## 2024-07-22 DIAGNOSIS — G4733 Obstructive sleep apnea (adult) (pediatric): Secondary | ICD-10-CM

## 2024-07-22 DIAGNOSIS — I719 Aortic aneurysm of unspecified site, without rupture: Secondary | ICD-10-CM

## 2024-07-22 LAB — T4, FREE: Free T4: 1.4 ng/dL (ref 0.8–1.8)

## 2024-07-22 LAB — TSH: TSH: 1.6 m[IU]/L (ref 0.40–4.50)

## 2024-07-22 NOTE — Patient Instructions (Addendum)
 Patient would like to wait a few months before seeing Cardiologist. Agrees to Coronary calcium  scoring. Order placed at [facility]. Patient may call for appointment. He will let me know if symptoms return. Free T4 and TSH drawn. Continue metoprolol . Descending aortic  Aneurysm needs ultrasound recheck 2028.

## 2024-07-22 NOTE — Progress Notes (Signed)
 Patient Care Team: Perri Ronal PARAS, MD as PCP - General (Internal Medicine) Nahser, Aleene PARAS, MD (Inactive) as PCP - Cardiology (Cardiology) Shlomo Wilbert SAUNDERS, MD as PCP - Sleep Medicine (Cardiology) Ivin Kocher, MD as Consulting Physician (Dermatology) Luis Purchase, MD as Consulting Physician (Gastroenterology) Cleatus Collar, MD as Consulting Physician (Ophthalmology)  Visit Date: 07/22/24  Subjective:   Chief Complaint  Patient presents with   Follow-up   ED   Vitals:   07/22/24 1125  BP: (!) 150/88   Patient PI:David Fuller,Male DOB:11-25-1960,63 y.o. MRN:[id number]   63 y.o.Male presents today for ED visit for tachycardia/ palpitations on Sept. 8th. Patient has a past medical history of Hyperlipidemia, Hypertension, Seasonal allergies, obesity, Asthma, sleep apnea, History of PVCs. Cardiologist was Dr. Alveta who recently retired. Hx sleep apnea seen by Dr. Wilbert Shlomo.Has CPAP device.  Hx 3 cm distal aortic aneurysm seen on CT May 2025. Ultrasound recommended every 3 years. Echocardiogram October 2024 normal LV function. EF 65-70% grade 1 diastolic dysfunction. Minimal aortic dilatation. Normal pulmonary artery pressures.    He said that he was getting ready for bed on the evening of Sept 8 and says he began to feel like he had been injected with epinephrine. Noticed heart racing, diaphoresis, and nausea. He did not have any vomiting. He did not have any chest or abdominal pain. He did admit to increased caffeine intake a few hours earlier and says that he has  had palpitations with caffeine previously.     Says with this episode, he felt like he was wired.  Had diaphoresis however by the time he got to the hospital says he felt fine. EKG in ED  revealed sinus rhythm, first degree AV block , incomplete RBBB. CXR was normal. He felt better and was discharged home.  EKG by Cardiologist 2023 did not mention BBB. EKG 2024 does mention incomplete RBBB and first  degree AV block.   Denies chest pain, SOB, palpitations. Is very physically active. Mows yard. Active with granchildren.  Spoke about sooner  Cardiology appointment, but he declines at this time He will watch his caffeine consumption, increase water intake, and symptom continue return to office.   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    Seasonal allergies 08/24/2013   Swollen lymph nodes    abd, groin,     Allergies  Allergen Reactions   Shellfish Allergy  Nausea Only   Iodine Other (See Comments)    Unknown Unknown Allergic to shellfish - nausea   Immunization History  Administered Date(s) Administered   DTaP 08/25/2011   Influenza Inj Mdck Quad With Preservative 08/19/2017, 09/14/2018   Influenza Whole 08/29/2013   Influenza-Unspecified 08/21/2022   Moderna Sars-Covid-2 Vaccination 11/09/2019, 12/07/2019   PNEUMOCOCCAL CONJUGATE-20 02/22/2024   Td 11/14/2020   Past Surgical History:  Procedure Laterality Date   VASECTOMY  MID 82S    Family History  Problem Relation Age of Onset   Atrial fibrillation Mother  Stroke Mother    Diabetes Father    Heart disease Father    Heart failure Father    Cancer Brother        Melanoma- left arm   Leukemia Maternal Grandmother    Cancer Paternal Grandmother    Social Hx: Married with one daughter and 2 grandchildren. Wife works for [employer] as a Water quality scientist for Dr. Perri. 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  Review of Systems  Constitutional:   Negative for fever and malaise/fatigue.  HENT:  Negative for congestion.   Eyes:  Negative for blurred vision.  Respiratory:  Negative for cough and shortness of breath.   Cardiovascular:  Negative for chest pain, palpitations and leg swelling.  Gastrointestinal:  Negative for vomiting.  Musculoskeletal:  Negative for back pain.  Skin:  Negative for rash.  Neurological:  Negative for loss of consciousness and headaches.     Objective:  Vitals: BP (!) 150/88   Pulse 80   Temp 98 F (36.7 C) (Oral)   Ht 6' 1 (1.854 m)   Wt 275 lb 12.8 oz (125.1 kg)   SpO2 98%   BMI 36.39 kg/m   Physical Exam Vitals and nursing note reviewed.  Constitutional:      General: He is not in acute distress.    Appearance: Normal appearance. He is not ill-appearing.  HENT:     Head: Normocephalic and atraumatic.  Pulmonary:     Effort: Pulmonary effort is normal.  Skin:    General: Skin is warm and dry.  Neurological:     Mental Status: He is alert and oriented to person, place, and time. Mental status is at baseline.  Psychiatric:        Mood and Affect: Mood normal.        Behavior: Behavior normal.        Thought Content: Thought content normal.        Judgment: Judgment normal.     Results:  Studies Obtained And Personally Reviewed By Me:    Labs:  CBC w/ Differential Lab Results  Component Value Date   WBC 7.7 07/18/2024   RBC 5.17 07/18/2024   HGB 15.8 07/18/2024   HCT 45.2 07/18/2024   PLT 215 07/18/2024   MCV 87.4 07/18/2024   MCH 30.6 07/18/2024   MCHC 35.0 07/18/2024   RDW 12.6 07/18/2024   MPV 10.2 02/18/2024   LYMPHSABS 0.7 03/25/2024   MONOABS 1.2 (H) 03/25/2024   BASOSABS 0.1 03/25/2024    Comprehensive Metabolic Panel Lab Results  Component Value Date   NA 136 07/18/2024   K 3.6 07/18/2024   CL 102 07/18/2024   CO2 25 07/18/2024   GLUCOSE 139 (H) 07/18/2024   BUN 13 07/18/2024   CREATININE 1.10 07/18/2024   CALCIUM  9.1 07/18/2024   PROT 7.8 03/25/2024    ALBUMIN 4.3 03/25/2024   AST 30 03/25/2024   ALT 41 03/25/2024   ALKPHOS 82 03/25/2024   BILITOT 2.3 (H) 03/25/2024   EGFR 97 11/20/2022   GFRNONAA >60 07/18/2024   Lipid Panel  Lab Results  Component Value Date   CHOL 141 02/18/2024   HDL 39 (L) 02/18/2024   LDLCALC 85 02/18/2024   TRIG 77 02/18/2024   A1c Lab Results  Component Value Date   HGBA1C 6.3 (H) 02/18/2024    TSH Lab Results  Component Value Date   TSH 2.11 11/20/2022   PSA Lab Results  Component Value Date  PSA 1.43 02/18/2024   PSA 1.19 11/20/2022   PSA 1.00 07/18/2021     Assessment & Plan:   Palpitations may have been exacerbated by consuming several [employer] soft drinks which contain caffeine 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 Alveta who has retired. BP today 150/88. He will monitor BP at home and let me know if not well controlled Hx of asthma BMI 36- needs to lose some weight Mild elevation of LDL Hx of descending aortic aneurysm 3 cm on CT May 2025. Needs ultrasound q 3 years per Radiologist   Plan: check TSH and free T4 with hx of palpitations. He declines to see Cardiologist at this time. Prefers to wait a few months. Agrees to coronary calcium  scoring- order placed  Currently on metoprolol  25 mg one half tab twice daily  Orders Placed This Encounter  Procedures   CT CARDIAC SCORING (SELF PAY ONLY)    Preferred imaging location?:   [facility]   T4, Free   TSH   I, Ronal JINNY Hailstone, MD, have reviewed all documentation for this visit. The documentation on 07/22/2024 for the exam, diagnosis, procedures, and orders are all accurate and complete.        I,Emily Lagle,acting as a Neurosurgeon for Ronal JINNY Hailstone, MD.,have documented all relevant documentation on the behalf of Ronal JINNY Hailstone,  MD,as directed by  Ronal JINNY Hailstone, MD while in the presence of Ronal JINNY Hailstone, MD.  I, Ronal JINNY Hailstone, MD, have reviewed all documentation for this visit. The documentation on 07/22/2024 for the exam, diagnosis, procedures, and orders are all accurate and complete.

## 2024-07-24 ENCOUNTER — Ambulatory Visit: Payer: Self-pay | Admitting: Internal Medicine

## 2024-08-17 ENCOUNTER — Ambulatory Visit (HOSPITAL_COMMUNITY)
Admission: RE | Admit: 2024-08-17 | Discharge: 2024-08-17 | Disposition: A | Discharge status: Home / Self Care | Payer: Self-pay | Source: Ambulatory Visit | Attending: Internal Medicine | Admitting: Internal Medicine

## 2024-08-17 DIAGNOSIS — R002 Palpitations: Secondary | ICD-10-CM | POA: Insufficient documentation

## 2024-08-22 NOTE — Progress Notes (Signed)
 Patient Care Team: Perri Ronal PARAS, MD as PCP - General (Internal Medicine) Nahser, Aleene PARAS, MD (Inactive) as PCP - Cardiology (Cardiology) Shlomo Wilbert SAUNDERS, MD as PCP - Sleep Medicine (Cardiology) Ivin Kocher, MD as Consulting Physician (Dermatology) Luis Purchase, MD as Consulting Physician (Gastroenterology) Cleatus Collar, MD as Consulting Physician (Ophthalmology)  Visit Date: 08/23/24  Subjective:    Patient ID: David Fuller , Male   DOB: 06/09/1961, 63 y.o.    MRN: [id number]   63 y.o. Male presents today for 6 month follow up visit. Patient has a past medical history of Hypertension, Hyperlipidemia, Impaired glucose Tolerance, Obesity, OSA, Asthma, Hx of  Vitamin D  and B12 deficiencies .  Was seen on 07/18/2024 in th ED for palpitations. EKG in ED  revealed sinus rhythm, first degree AV block , incomplete RBBB. CXR was normal. He felt better and was discharged home.  He says that he hasn't experienced any further palpitations.    History of Hypertension treated with Metoprolol  Tartrate 12.5 mg twice daily. Blood pressure today is normal at 118/80.   History of Hyperlipidemia treated with Rosuvastatin  5 mg daily,   History of Impaired Glucose Tolerance 02/18/2024 Glucose 141, elevated from 109 in 2024. Has been consistently elevated from 101 - 129 from 2020 - 2023, except in 2022 when it was 93.     History of Obesity; BMI since 2012 his weight has fluctuated from 267, BMI 37.24 to his weight today of 277, BMI 36.56. His highest weight has been 284, BMI 38.52 with his lowest being 244, BMI 32.19 but has generally remained consistent around 279, BMI 36.81. His weight today is 272 lbs BMI 35.89.    History of Obstructive Sleep Apnea treated with CPAP device.   History of Asthma; Seasonal Allergies managed with Flonase .   History of Vitamin Deficiencies, D and B12, treated with Vitamin-D 5,000 units daily and PO Vitamin-B12 5,000 mcg weekly.    08/17/2024  Coronary Calcium  score: 0.   Vaccine counseling: Influenza vaccine received today. Covid-19 vaccine declined.   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    Seasonal allergies 08/24/2013   Swollen lymph nodes    abd, groin,      Family History  Problem Relation Age of Onset   Atrial fibrillation Mother    Stroke Mother    Diabetes Father    Heart disease Father    Heart failure Father    Cancer Brother        Melanoma- left arm   Leukemia Maternal Grandmother    Cancer Paternal Grandmother     Social History   Social History Narrative   2025 - Married to wife, Joni Mallery - she works as a Water quality scientist for [employer]. 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 1 Daughter, 2 grandchildren.      Review of Systems  All other systems reviewed and are negative.  Objective:   Vitals: BP 118/80   Pulse 78   Ht 6' 1 (1.854 m)   Wt 272 lb (123.4 kg)   SpO2 96%   BMI 35.89 kg/m    Physical Exam Constitutional:      General: He is not in acute distress.    Appearance: Normal appearance. He is not ill-appearing.  HENT:     Head: Normocephalic and atraumatic.  Cardiovascular:     Rate and Rhythm: Normal rate and regular rhythm.     Pulses: Normal pulses.     Heart sounds: Normal heart sounds. No murmur heard.    No friction rub. No gallop.  Pulmonary:     Effort: Pulmonary effort is normal. No respiratory distress.     Breath sounds: Normal breath sounds. No wheezing or rales.  Skin:     General: Skin is warm and dry.  Neurological:     Mental Status: He is alert and oriented to person, place, and time. Mental status is at baseline.  Psychiatric:        Mood and Affect: Mood normal.        Behavior: Behavior normal.        Thought Content: Thought content normal.        Judgment: Judgment normal.       Results:   Studies obtained and personally reviewed by me:  08/17/2024 Coronary Calcium  score: 0.    Labs:       Component Value Date/Time   NA 136 07/18/2024 0016   NA 140 12/16/2018 0931   K 3.6 07/18/2024 0016   CL 102 07/18/2024 0016   CO2 25 07/18/2024 0016   GLUCOSE 139 (H) 07/18/2024 0016   BUN 13 07/18/2024 0016   BUN 15 12/16/2018 0931   CREATININE 1.10 07/18/2024 0016   CREATININE 0.98 02/18/2024 0907   CALCIUM  9.1 07/18/2024 0016   PROT 7.8 03/25/2024 1113   PROT 6.5 12/16/2018 0931   ALBUMIN 4.3 03/25/2024 1113   ALBUMIN 4.3 12/16/2018 0931   AST 30 03/25/2024 1113   ALT 41 03/25/2024 1113   ALKPHOS 82 03/25/2024 1113   BILITOT 2.3 (H) 03/25/2024 1113   BILITOT 0.8 12/16/2018 0931   GFRNONAA >60 07/18/2024 0016   GFRNONAA 86 10/11/2020 1029   GFRAA 100 10/11/2020 1029     Lab Results  Component Value Date   WBC 7.7 07/18/2024   HGB 15.8 07/18/2024   HCT 45.2 07/18/2024   MCV 87.4 07/18/2024   PLT 215 07/18/2024    Lab Results  Component Value Date   CHOL 141 02/18/2024   HDL 39 (L) 02/18/2024   LDLCALC 85 02/18/2024   TRIG 77 02/18/2024   CHOLHDL 3.6 02/18/2024    Lab Results  Component Value Date   HGBA1C 6.3 (H) 02/18/2024     Lab Results  Component Value Date   TSH 1.60 07/22/2024     Lab Results  Component Value Date   PSA 1.43 02/18/2024   PSA 1.19 11/20/2022   PSA 1.00 07/18/2021        Assessment & Plan:   Was seen on 07/18/2024 in th ED for palpitations. EKG in ED  revealed sinus rhythm, first degree AV block , incomplete RBBB. CXR was normal. He felt better and was discharged home.  He says that  he hasn't experienced any further palpitations.    Hypertension: treated with Metoprolol  Tartrate 12.5 mg twice daily. Blood pressure today is normal at 118/80.   Hyperlipidemia: treated with  Rosuvastatin  5 mg daily,   Impaired Glucose Tolerance: 02/18/2024 Glucose 141, elevated from 109 in 2024. Has been consistently elevated from 101 - 129 from 2020 - 2023, except in 2022 when it was 93.  Hgb AIC is 5.8%   Obesity: BMI since 2012 his weight has fluctuated from 267, BMI 37.24 to his weight today of 277, BMI 36.56. His highest weight has been 284, BMI 38.52 with his lowest being 244, BMI 32.19 but has generally remained consistent around 279, BMI 36.81. His weight today is 272 lbs BMI 35.89.    Obstructive Sleep Apnea: treated with CPAP device.   Asthma; Seasonal Allergies: managed with Flonase .   Vitamin Deficiencies, D and B12: treated with Vitamin-D 5,000 units daily and PO Vitamin-B12 5,000 mcg weekly.    08/17/2024 Coronary Calcium  score: 0.   Vaccine counseling: Influenza vaccine received today. Covid-19 vaccine declined.   Ascending thoracic aorta 4.4 cm. Annual imaging is recommended. Coronary calcium  score is 0.  I,Makayla C Reid,acting as a scribe for Ronal JINNY Hailstone, MD.,have documented all relevant documentation on the behalf of Ronal JINNY Hailstone, MD,as directed by  Ronal JINNY Hailstone, MD while in the presence of Ronal JINNY Hailstone, MD.  I, Ronal JINNY Hailstone, MD, have reviewed all documentation for this visit. The documentation on 08/23/2024 for the exam, diagnosis, procedures, and orders are all accurate and complete.

## 2024-08-23 ENCOUNTER — Ambulatory Visit: Admitting: Internal Medicine

## 2024-08-23 VITALS — BP 118/80 | HR 78 | Ht 73.0 in | Wt 272.0 lb

## 2024-08-23 DIAGNOSIS — Z8709 Personal history of other diseases of the respiratory system: Secondary | ICD-10-CM

## 2024-08-23 DIAGNOSIS — G4733 Obstructive sleep apnea (adult) (pediatric): Secondary | ICD-10-CM

## 2024-08-23 DIAGNOSIS — Z23 Encounter for immunization: Secondary | ICD-10-CM

## 2024-08-23 DIAGNOSIS — R7302 Impaired glucose tolerance (oral): Secondary | ICD-10-CM | POA: Diagnosis not present

## 2024-08-23 DIAGNOSIS — I10 Essential (primary) hypertension: Secondary | ICD-10-CM

## 2024-08-23 DIAGNOSIS — E785 Hyperlipidemia, unspecified: Secondary | ICD-10-CM | POA: Diagnosis not present

## 2024-08-23 DIAGNOSIS — I719 Aortic aneurysm of unspecified site, without rupture: Secondary | ICD-10-CM

## 2024-08-24 ENCOUNTER — Ambulatory Visit: Payer: Self-pay | Admitting: Internal Medicine

## 2024-08-24 LAB — HEMOGLOBIN A1C
Hgb A1c MFr Bld: 5.8 % — ABNORMAL HIGH (ref <5.7)
Mean Plasma Glucose: 120 mg/dL
eAG (mmol/L): 6.6 mmol/L

## 2024-08-31 NOTE — Patient Instructions (Addendum)
 Labs are stable. Continue diet and exercise efforts.Keep appt with Dr. Jeffrie in December. Annual exam due April 2026.

## 2024-10-11 ENCOUNTER — Ambulatory Visit: Admitting: Cardiology

## 2024-10-12 ENCOUNTER — Ambulatory Visit: Admitting: Cardiology

## 2024-12-09 ENCOUNTER — Encounter: Payer: Self-pay | Admitting: Internal Medicine

## 2024-12-09 ENCOUNTER — Telehealth: Payer: Self-pay | Admitting: Internal Medicine

## 2024-12-09 DIAGNOSIS — Z20828 Contact with and (suspected) exposure to other viral communicable diseases: Secondary | ICD-10-CM

## 2024-12-09 MED ORDER — OSELTAMIVIR PHOSPHATE 75 MG PO CAPS: 75.0000 mg | ORAL_CAPSULE | Freq: Two times a day (BID) | ORAL | 0 refills | Status: AC

## 2024-12-09 NOTE — Telephone Encounter (Signed)
 Patient exposed to Influenza A  by grandson. Has not been vaccinated for Flu this year. Sending in Tamiflu  for him. Asymptomatic thus far. He just learned about this yesterday. MJB, MD

## 2025-01-04 ENCOUNTER — Ambulatory Visit: Admitting: Cardiology
